# Patient Record
Sex: Female | Born: 1935 | Race: Black or African American | Hispanic: No | Marital: Single | Smoking: Former smoker
Health system: Southern US, Community
[De-identification: ages and names within clinical notes are randomized; demographics above are authoritative.]

## PROBLEM LIST (undated history)

## (undated) DIAGNOSIS — I10 Essential (primary) hypertension: Secondary | ICD-10-CM

## (undated) DIAGNOSIS — E079 Disorder of thyroid, unspecified: Secondary | ICD-10-CM

## (undated) DIAGNOSIS — Z923 Personal history of irradiation: Secondary | ICD-10-CM

## (undated) DIAGNOSIS — Z9221 Personal history of antineoplastic chemotherapy: Secondary | ICD-10-CM

## (undated) DIAGNOSIS — C50919 Malignant neoplasm of unspecified site of unspecified female breast: Secondary | ICD-10-CM

## (undated) DIAGNOSIS — C649 Malignant neoplasm of unspecified kidney, except renal pelvis: Secondary | ICD-10-CM

## (undated) HISTORY — DX: Malignant neoplasm of unspecified site of unspecified female breast: C50.919

## (undated) HISTORY — PX: BREAST BIOPSY: SHX20

## (undated) HISTORY — PX: APPENDECTOMY: SHX54

## (undated) HISTORY — PX: OTHER SURGICAL HISTORY: SHX169

## (undated) HISTORY — DX: Disorder of thyroid, unspecified: E07.9

## (undated) HISTORY — DX: Essential (primary) hypertension: I10

## (undated) HISTORY — PX: BREAST SURGERY: SHX581

## (undated) HISTORY — PX: VAGINAL HYSTERECTOMY: SUR661

---

## 2004-02-05 ENCOUNTER — Ambulatory Visit: Payer: Self-pay | Admitting: Oncology

## 2004-02-17 ENCOUNTER — Ambulatory Visit: Payer: Self-pay | Admitting: Oncology

## 2004-03-25 ENCOUNTER — Ambulatory Visit: Payer: Self-pay | Admitting: Oncology

## 2005-02-10 ENCOUNTER — Ambulatory Visit: Payer: Self-pay | Admitting: Oncology

## 2005-02-16 ENCOUNTER — Ambulatory Visit: Payer: Self-pay | Admitting: Oncology

## 2005-04-07 ENCOUNTER — Ambulatory Visit: Payer: Self-pay | Admitting: Oncology

## 2010-12-14 ENCOUNTER — Ambulatory Visit: Payer: Self-pay | Admitting: Family Medicine

## 2010-12-15 ENCOUNTER — Ambulatory Visit: Payer: Self-pay | Admitting: Unknown Physician Specialty

## 2011-01-06 ENCOUNTER — Ambulatory Visit: Payer: Self-pay | Admitting: Surgery

## 2011-01-07 LAB — CEA: CEA: 3 ng/mL (ref 0.0–4.7)

## 2011-01-17 ENCOUNTER — Ambulatory Visit: Payer: Self-pay | Admitting: Internal Medicine

## 2011-02-04 ENCOUNTER — Ambulatory Visit: Payer: Self-pay | Admitting: Surgery

## 2011-02-04 HISTORY — PX: MASTECTOMY: SHX3

## 2011-03-07 ENCOUNTER — Ambulatory Visit: Payer: Self-pay | Admitting: Oncology

## 2011-03-25 ENCOUNTER — Ambulatory Visit: Payer: Self-pay | Admitting: Oncology

## 2011-04-19 ENCOUNTER — Ambulatory Visit: Payer: Self-pay | Admitting: Oncology

## 2011-05-02 LAB — CBC CANCER CENTER
HCT: 38.4 % (ref 35.0–47.0)
HGB: 12.4 g/dL (ref 12.0–16.0)
Lymphocytes: 20 %
MCHC: 32.4 g/dL (ref 32.0–36.0)
MCV: 80 fL (ref 80–100)
Monocytes: 7 %
RDW: 14.6 % — ABNORMAL HIGH (ref 11.5–14.5)
Segmented Neutrophils: 72 %
WBC: 3.7 x10 3/mm (ref 3.6–11.0)

## 2011-05-09 LAB — CBC CANCER CENTER
Basophil #: 0 x10 3/mm (ref 0.0–0.1)
Eosinophil #: 0 x10 3/mm (ref 0.0–0.7)
HGB: 13.4 g/dL (ref 12.0–16.0)
Lymphocyte #: 1.1 x10 3/mm (ref 1.0–3.6)
Lymphocyte %: 29.4 %
Lymphocytes: 24 %
MCH: 25.9 pg — ABNORMAL LOW (ref 26.0–34.0)
MCHC: 32.9 g/dL (ref 32.0–36.0)
MCV: 79 fL — ABNORMAL LOW (ref 80–100)
Monocyte #: 0.4 x10 3/mm (ref 0.0–0.7)
Monocytes: 8 %
Neutrophil %: 58.5 %
Platelet: 236 x10 3/mm (ref 150–440)
RBC: 5.15 10*6/uL (ref 3.80–5.20)
RDW: 14.8 % — ABNORMAL HIGH (ref 11.5–14.5)
Segmented Neutrophils: 68 %

## 2011-05-16 LAB — CBC CANCER CENTER
Basophil #: 0 x10 3/mm (ref 0.0–0.1)
Basophil %: 0 %
Eosinophil #: 0 x10 3/mm (ref 0.0–0.7)
HGB: 13 g/dL (ref 12.0–16.0)
Lymphocyte %: 16.5 %
MCH: 26.1 pg (ref 26.0–34.0)
MCHC: 32.9 g/dL (ref 32.0–36.0)
Monocyte #: 0.4 x10 3/mm (ref 0.0–0.7)
Monocyte %: 14.7 %
Neutrophil %: 68.8 %
RDW: 15.1 % — ABNORMAL HIGH (ref 11.5–14.5)

## 2011-05-20 ENCOUNTER — Ambulatory Visit: Payer: Self-pay | Admitting: Oncology

## 2011-05-23 LAB — CBC CANCER CENTER
HCT: 40.2 % (ref 35.0–47.0)
HGB: 13.3 g/dL (ref 12.0–16.0)
Lymphocytes: 14 %
MCV: 79 fL — ABNORMAL LOW (ref 80–100)
Monocytes: 14 %
RBC: 5.1 10*6/uL (ref 3.80–5.20)
WBC: 3 x10 3/mm — ABNORMAL LOW (ref 3.6–11.0)

## 2011-06-06 LAB — CBC CANCER CENTER
Bands: 2 %
Basophil %: 0 %
Comment - H1-Com3: NORMAL
Eosinophil %: 0.3 %
HCT: 37.4 % (ref 35.0–47.0)
HGB: 12.4 g/dL (ref 12.0–16.0)
Lymphocyte #: 0.6 x10 3/mm — ABNORMAL LOW (ref 1.0–3.6)
Lymphocytes: 22 %
MCH: 26.2 pg (ref 26.0–34.0)
MCHC: 33.1 g/dL (ref 32.0–36.0)
MCV: 79 fL — ABNORMAL LOW (ref 80–100)
Monocyte #: 0.3 x10 3/mm (ref 0.0–0.7)
Monocytes: 12 %
RDW: 15.9 % — ABNORMAL HIGH (ref 11.5–14.5)
WBC: 2.4 x10 3/mm — ABNORMAL LOW (ref 3.6–11.0)

## 2011-06-17 ENCOUNTER — Ambulatory Visit: Payer: Self-pay | Admitting: Oncology

## 2011-07-14 LAB — CBC CANCER CENTER
HGB: 13.9 g/dL (ref 12.0–16.0)
Lymphocyte #: 0.8 x10 3/mm — ABNORMAL LOW (ref 1.0–3.6)
Lymphocyte %: 28.7 %
Lymphocytes: 27 %
MCH: 26.5 pg (ref 26.0–34.0)
MCHC: 33.4 g/dL (ref 32.0–36.0)
MCV: 79 fL — ABNORMAL LOW (ref 80–100)
Monocyte %: 15.5 %
Monocytes: 12 %
Neutrophil #: 1.6 x10 3/mm (ref 1.4–6.5)
RBC: 5.24 10*6/uL — ABNORMAL HIGH (ref 3.80–5.20)
Segmented Neutrophils: 61 %
WBC: 2.9 x10 3/mm — ABNORMAL LOW (ref 3.6–11.0)

## 2011-07-14 LAB — COMPREHENSIVE METABOLIC PANEL
Albumin: 3.6 g/dL (ref 3.4–5.0)
Alkaline Phosphatase: 75 U/L (ref 50–136)
Anion Gap: 8 (ref 7–16)
BUN: 20 mg/dL — ABNORMAL HIGH (ref 7–18)
Chloride: 106 mmol/L (ref 98–107)
EGFR (African American): >60
EGFR (Non-African Amer.): 53 — ABNORMAL LOW
Glucose: 123 mg/dL — ABNORMAL HIGH (ref 65–99)
Osmolality: 287 (ref 275–301)
Potassium: 3.2 mmol/L — ABNORMAL LOW (ref 3.5–5.1)
SGOT(AST): 21 U/L (ref 15–37)
Sodium: 142 mmol/L (ref 136–145)

## 2011-07-18 ENCOUNTER — Ambulatory Visit: Payer: Self-pay | Admitting: Oncology

## 2011-07-18 LAB — CANCER ANTIGEN 27.29: CA 27.29: 32.7 U/mL (ref 0.0–38.6)

## 2012-01-16 ENCOUNTER — Ambulatory Visit: Payer: Self-pay | Admitting: Oncology

## 2012-01-16 LAB — COMPREHENSIVE METABOLIC PANEL
Alkaline Phosphatase: 78 U/L (ref 50–136)
Anion Gap: 12 (ref 7–16)
BUN: 10 mg/dL (ref 7–18)
Bilirubin,Total: 0.3 mg/dL (ref 0.2–1.0)
Calcium, Total: 10.4 mg/dL — ABNORMAL HIGH (ref 8.5–10.1)
Co2: 27 mmol/L (ref 21–32)
EGFR (African American): >60
EGFR (Non-African Amer.): >60
Glucose: 100 mg/dL — ABNORMAL HIGH (ref 65–99)
Potassium: 3.4 mmol/L — ABNORMAL LOW (ref 3.5–5.1)
SGOT(AST): 23 U/L (ref 15–37)
SGPT (ALT): 24 U/L (ref 12–78)
Sodium: 142 mmol/L (ref 136–145)
Total Protein: 8 g/dL (ref 6.4–8.2)

## 2012-01-16 LAB — CBC CANCER CENTER
Basophil %: 0.2 %
Eosinophil %: 0.5 %
HCT: 44.5 % (ref 35.0–47.0)
Lymphocyte #: 0.8 x10 3/mm — ABNORMAL LOW (ref 1.0–3.6)
Monocyte %: 10 %
Neutrophil %: 68.3 %
Platelet: 157 x10 3/mm (ref 150–440)
WBC: 4 x10 3/mm (ref 3.6–11.0)

## 2012-01-17 ENCOUNTER — Ambulatory Visit: Payer: Self-pay | Admitting: Oncology

## 2012-01-17 LAB — CANCER ANTIGEN 27.29: CA 27.29: 22.4 U/mL (ref 0.0–38.6)

## 2012-02-21 ENCOUNTER — Inpatient Hospital Stay: Payer: Self-pay | Admitting: Surgery

## 2012-02-21 LAB — URINALYSIS, COMPLETE
Bilirubin,UR: NEGATIVE
Blood: NEGATIVE
Glucose,UR: NEGATIVE mg/dL (ref 0–75)
Nitrite: NEGATIVE
Ph: 6 (ref 4.5–8.0)
Protein: NEGATIVE
Squamous Epithelial: 1
WBC UR: 1 /HPF (ref 0–5)

## 2012-02-21 LAB — COMPREHENSIVE METABOLIC PANEL
Albumin: 3.3 g/dL — ABNORMAL LOW (ref 3.4–5.0)
Alkaline Phosphatase: 71 U/L (ref 50–136)
Anion Gap: 7 (ref 7–16)
BUN: 15 mg/dL (ref 7–18)
Calcium, Total: 10 mg/dL (ref 8.5–10.1)
Co2: 28 mmol/L (ref 21–32)
EGFR (Non-African Amer.): >60
Glucose: 98 mg/dL (ref 65–99)
Osmolality: 278 (ref 275–301)
Potassium: 4.1 mmol/L (ref 3.5–5.1)
SGPT (ALT): 18 U/L (ref 12–78)
Total Protein: 8.1 g/dL (ref 6.4–8.2)

## 2012-02-21 LAB — CBC
HCT: 41 % (ref 35.0–47.0)
MCH: 28.1 pg (ref 26.0–34.0)
MCHC: 34 g/dL (ref 32.0–36.0)
Platelet: 193 10*3/uL (ref 150–440)

## 2012-02-21 LAB — LIPASE, BLOOD: Lipase: 143 U/L (ref 73–393)

## 2012-02-22 LAB — BASIC METABOLIC PANEL
Calcium, Total: 8.9 mg/dL (ref 8.5–10.1)
Chloride: 102 mmol/L (ref 98–107)
Co2: 29 mmol/L (ref 21–32)
Creatinine: 0.85 mg/dL (ref 0.60–1.30)
EGFR (Non-African Amer.): >60
Osmolality: 278 (ref 275–301)

## 2012-02-22 LAB — CBC WITH DIFFERENTIAL/PLATELET
Basophil #: 0 10*3/uL (ref 0.0–0.1)
Eosinophil %: 0.1 %
HGB: 12.4 g/dL (ref 12.0–16.0)
MCHC: 33.7 g/dL (ref 32.0–36.0)
Monocyte #: 0.8 x10 3/mm (ref 0.2–0.9)
Monocyte %: 5.7 %
Neutrophil %: 89.7 %
Platelet: 201 10*3/uL (ref 150–440)
RBC: 4.48 10*6/uL (ref 3.80–5.20)
RDW: 15 % — ABNORMAL HIGH (ref 11.5–14.5)
WBC: 13.3 10*3/uL — ABNORMAL HIGH (ref 3.6–11.0)

## 2012-02-24 LAB — BASIC METABOLIC PANEL
BUN: 2 mg/dL — ABNORMAL LOW (ref 7–18)
Calcium, Total: 8.9 mg/dL (ref 8.5–10.1)
EGFR (Non-African Amer.): >60
Glucose: 124 mg/dL — ABNORMAL HIGH (ref 65–99)
Osmolality: 281 (ref 275–301)
Sodium: 142 mmol/L (ref 136–145)

## 2012-02-24 LAB — CBC WITH DIFFERENTIAL/PLATELET
Basophil #: 0 10*3/uL (ref 0.0–0.1)
Eosinophil %: 0.3 %
Lymphocyte %: 12.5 %
MCH: 27.8 pg (ref 26.0–34.0)
MCV: 81 fL (ref 80–100)
Monocyte %: 7.6 %
Neutrophil %: 79.3 %
Platelet: 227 10*3/uL (ref 150–440)
RBC: 4.47 10*6/uL (ref 3.80–5.20)
RDW: 14.9 % — ABNORMAL HIGH (ref 11.5–14.5)
WBC: 5.2 10*3/uL (ref 3.6–11.0)

## 2012-04-03 ENCOUNTER — Ambulatory Visit: Payer: Self-pay | Admitting: Surgery

## 2012-04-03 LAB — CBC WITH DIFFERENTIAL/PLATELET
Basophil #: 0 10*3/uL (ref 0.0–0.1)
Basophil %: 0.2 %
Eosinophil #: 0 10*3/uL (ref 0.0–0.7)
Eosinophil %: 0 %
HCT: 41 % (ref 35.0–47.0)
HGB: 13.6 g/dL (ref 12.0–16.0)
Lymphocyte #: 0.8 10*3/uL — ABNORMAL LOW (ref 1.0–3.6)
MCHC: 33.1 g/dL (ref 32.0–36.0)
MCV: 84 fL (ref 80–100)
Monocyte #: 0.5 x10 3/mm (ref 0.2–0.9)
Monocyte %: 10.6 %
Neutrophil #: 3.4 10*3/uL (ref 1.4–6.5)
Neutrophil %: 73.1 %
Platelet: 177 10*3/uL (ref 150–440)
RBC: 4.91 10*6/uL (ref 3.80–5.20)
WBC: 4.7 10*3/uL (ref 3.6–11.0)

## 2012-04-03 LAB — BASIC METABOLIC PANEL
BUN: 14 mg/dL (ref 7–18)
Chloride: 103 mmol/L (ref 98–107)
Creatinine: 1.04 mg/dL (ref 0.60–1.30)
Potassium: 2.9 mmol/L — ABNORMAL LOW (ref 3.5–5.1)
Sodium: 143 mmol/L (ref 136–145)

## 2012-04-06 ENCOUNTER — Ambulatory Visit: Payer: Self-pay | Admitting: Surgery

## 2012-05-10 ENCOUNTER — Ambulatory Visit: Payer: Self-pay | Admitting: Urology

## 2012-05-24 ENCOUNTER — Ambulatory Visit: Payer: Self-pay | Admitting: Urology

## 2012-05-25 ENCOUNTER — Ambulatory Visit: Payer: Self-pay | Admitting: Oncology

## 2012-05-25 LAB — CBC CANCER CENTER
Basophil #: 0 x10 3/mm (ref 0.0–0.1)
Basophil %: 0.5 %
Eosinophil %: 0 %
HCT: 39.1 % (ref 35.0–47.0)
Lymphocyte #: 0.7 x10 3/mm — ABNORMAL LOW (ref 1.0–3.6)
Lymphocyte %: 19.5 %
MCHC: 33.4 g/dL (ref 32.0–36.0)
MCV: 83 fL (ref 80–100)
Monocyte #: 0.3 x10 3/mm (ref 0.2–0.9)
Monocyte %: 7.1 %
Neutrophil #: 2.6 x10 3/mm (ref 1.4–6.5)
Neutrophil %: 72.9 %
Platelet: 158 x10 3/mm (ref 150–440)
RBC: 4.71 10*6/uL (ref 3.80–5.20)
WBC: 3.6 x10 3/mm (ref 3.6–11.0)

## 2012-05-25 LAB — COMPREHENSIVE METABOLIC PANEL
Anion Gap: 9 (ref 7–16)
BUN: 18 mg/dL (ref 7–18)
Bilirubin,Total: 0.4 mg/dL (ref 0.2–1.0)
Calcium, Total: 9.8 mg/dL (ref 8.5–10.1)
Co2: 28 mmol/L (ref 21–32)
Creatinine: 1.01 mg/dL (ref 0.60–1.30)
Osmolality: 287 (ref 275–301)
Potassium: 3.5 mmol/L (ref 3.5–5.1)
SGOT(AST): 19 U/L (ref 15–37)

## 2012-05-25 LAB — APTT: Activated PTT: 32.1 secs (ref 23.6–35.9)

## 2012-05-25 LAB — PROTIME-INR: Prothrombin Time: 13.1 secs (ref 11.5–14.7)

## 2012-06-08 ENCOUNTER — Ambulatory Visit: Payer: Self-pay | Admitting: Internal Medicine

## 2012-06-16 ENCOUNTER — Ambulatory Visit: Payer: Self-pay | Admitting: Oncology

## 2012-07-13 LAB — COMPREHENSIVE METABOLIC PANEL
Albumin: 3.6 g/dL (ref 3.4–5.0)
Alkaline Phosphatase: 81 U/L (ref 50–136)
Anion Gap: 10 (ref 7–16)
BUN: 17 mg/dL (ref 7–18)
Chloride: 105 mmol/L (ref 98–107)
Creatinine: 0.97 mg/dL (ref 0.60–1.30)
EGFR (African American): >60
Osmolality: 284 (ref 275–301)
Potassium: 3.4 mmol/L — ABNORMAL LOW (ref 3.5–5.1)
SGOT(AST): 17 U/L (ref 15–37)
SGPT (ALT): 18 U/L (ref 12–78)
Sodium: 142 mmol/L (ref 136–145)
Total Protein: 7.8 g/dL (ref 6.4–8.2)

## 2012-07-13 LAB — CBC CANCER CENTER
Basophil #: 0 x10 3/mm (ref 0.0–0.1)
Basophil %: 0.7 %
Eosinophil #: 0 x10 3/mm (ref 0.0–0.7)
Eosinophil %: 0 %
Lymphocyte %: 21.1 %
MCH: 25.7 pg — ABNORMAL LOW (ref 26.0–34.0)
MCHC: 31.5 g/dL — ABNORMAL LOW (ref 32.0–36.0)
RDW: 14.3 % (ref 11.5–14.5)
WBC: 5.1 x10 3/mm (ref 3.6–11.0)

## 2012-07-17 ENCOUNTER — Ambulatory Visit: Payer: Self-pay | Admitting: Oncology

## 2012-08-01 ENCOUNTER — Ambulatory Visit: Payer: Self-pay | Admitting: Surgery

## 2012-08-01 LAB — POTASSIUM: Potassium: 3.9 mmol/L (ref 3.5–5.1)

## 2012-08-01 LAB — PATHOLOGY REPORT

## 2012-08-02 ENCOUNTER — Ambulatory Visit: Payer: Self-pay | Admitting: Surgery

## 2012-08-08 LAB — COMPREHENSIVE METABOLIC PANEL
Albumin: 3.3 g/dL — ABNORMAL LOW (ref 3.4–5.0)
Alkaline Phosphatase: 75 U/L (ref 50–136)
Anion Gap: 9 (ref 7–16)
Bilirubin,Total: 0.2 mg/dL (ref 0.2–1.0)
Calcium, Total: 9.6 mg/dL (ref 8.5–10.1)
Chloride: 108 mmol/L — ABNORMAL HIGH (ref 98–107)
Co2: 27 mmol/L (ref 21–32)
Creatinine: 1.07 mg/dL (ref 0.60–1.30)
EGFR (African American): 55 — ABNORMAL LOW
EGFR (Non-African Amer.): 47 — ABNORMAL LOW
Potassium: 3.3 mmol/L — ABNORMAL LOW (ref 3.5–5.1)
SGOT(AST): 40 U/L — ABNORMAL HIGH (ref 15–37)
SGPT (ALT): 50 U/L (ref 12–78)
Sodium: 144 mmol/L (ref 136–145)
Total Protein: 7.3 g/dL (ref 6.4–8.2)

## 2012-08-08 LAB — CBC CANCER CENTER
Basophil #: 0 x10 3/mm (ref 0.0–0.1)
Basophil %: 0.1 %
Eosinophil #: 0 x10 3/mm (ref 0.0–0.7)
Eosinophil %: 0 %
HGB: 12 g/dL (ref 12.0–16.0)
Lymphocyte #: 0.8 x10 3/mm — ABNORMAL LOW (ref 1.0–3.6)
MCHC: 32.7 g/dL (ref 32.0–36.0)
MCV: 81 fL (ref 80–100)
Neutrophil #: 3.2 x10 3/mm (ref 1.4–6.5)
Neutrophil %: 75.2 %
Platelet: 149 x10 3/mm — ABNORMAL LOW (ref 150–440)
RDW: 14.6 % — ABNORMAL HIGH (ref 11.5–14.5)
WBC: 4.3 x10 3/mm (ref 3.6–11.0)

## 2012-08-15 LAB — COMPREHENSIVE METABOLIC PANEL
Albumin: 3.3 g/dL — ABNORMAL LOW (ref 3.4–5.0)
Alkaline Phosphatase: 61 U/L (ref 50–136)
Anion Gap: 8 (ref 7–16)
Calcium, Total: 9.6 mg/dL (ref 8.5–10.1)
Chloride: 106 mmol/L (ref 98–107)
Creatinine: 1 mg/dL (ref 0.60–1.30)
SGOT(AST): 12 U/L — ABNORMAL LOW (ref 15–37)
SGPT (ALT): 20 U/L (ref 12–78)
Total Protein: 7.2 g/dL (ref 6.4–8.2)

## 2012-08-15 LAB — CBC CANCER CENTER
Basophil #: 0 x10 3/mm (ref 0.0–0.1)
Basophil %: 0.1 %
HCT: 36.9 % (ref 35.0–47.0)
Lymphocyte #: 0.9 x10 3/mm — ABNORMAL LOW (ref 1.0–3.6)
MCHC: 32.3 g/dL (ref 32.0–36.0)
Neutrophil %: 66.4 %
Platelet: 169 x10 3/mm (ref 150–440)
RDW: 14.8 % — ABNORMAL HIGH (ref 11.5–14.5)

## 2012-08-16 ENCOUNTER — Ambulatory Visit: Payer: Self-pay | Admitting: Oncology

## 2012-08-22 LAB — CBC CANCER CENTER
Basophil #: 0 x10 3/mm (ref 0.0–0.1)
Basophil %: 0.1 %
Eosinophil #: 0 x10 3/mm (ref 0.0–0.7)
Eosinophil %: 0 %
Lymphocyte %: 24.8 %
MCV: 81 fL (ref 80–100)
Monocyte #: 0.3 x10 3/mm (ref 0.2–0.9)
Neutrophil #: 2.1 x10 3/mm (ref 1.4–6.5)
Neutrophil %: 66.6 %
Platelet: 151 x10 3/mm (ref 150–440)
RBC: 4.3 10*6/uL (ref 3.80–5.20)
RDW: 15.1 % — ABNORMAL HIGH (ref 11.5–14.5)

## 2012-09-12 LAB — CBC CANCER CENTER
Basophil #: 0 x10 3/mm (ref 0.0–0.1)
Basophil %: 0.4 %
Eosinophil %: 0 %
HCT: 35.9 % (ref 35.0–47.0)
HGB: 11.8 g/dL — ABNORMAL LOW (ref 12.0–16.0)
Lymphocyte #: 0.4 x10 3/mm — ABNORMAL LOW (ref 1.0–3.6)
Lymphocyte %: 11.6 %
MCH: 26.5 pg (ref 26.0–34.0)
MCHC: 32.9 g/dL (ref 32.0–36.0)
Monocyte #: 0.4 x10 3/mm (ref 0.2–0.9)
Neutrophil #: 2.5 x10 3/mm (ref 1.4–6.5)
Neutrophil %: 74.7 %
Platelet: 151 x10 3/mm (ref 150–440)
RBC: 4.46 10*6/uL (ref 3.80–5.20)
RDW: 16.2 % — ABNORMAL HIGH (ref 11.5–14.5)

## 2012-09-12 LAB — COMPREHENSIVE METABOLIC PANEL
Albumin: 3.3 g/dL — ABNORMAL LOW (ref 3.4–5.0)
Alkaline Phosphatase: 66 U/L (ref 50–136)
Anion Gap: 7 (ref 7–16)
BUN: 19 mg/dL — ABNORMAL HIGH (ref 7–18)
Bilirubin,Total: 0.2 mg/dL (ref 0.2–1.0)
Calcium, Total: 9.4 mg/dL (ref 8.5–10.1)
Chloride: 105 mmol/L (ref 98–107)
Co2: 30 mmol/L (ref 21–32)
EGFR (African American): >60
EGFR (Non-African Amer.): 55 — ABNORMAL LOW
Osmolality: 287 (ref 275–301)
SGPT (ALT): 17 U/L (ref 12–78)
Sodium: 142 mmol/L (ref 136–145)

## 2012-09-16 ENCOUNTER — Ambulatory Visit: Payer: Self-pay | Admitting: Oncology

## 2012-09-17 LAB — COMPREHENSIVE METABOLIC PANEL
Alkaline Phosphatase: 74 U/L (ref 50–136)
BUN: 15 mg/dL (ref 7–18)
Bilirubin,Total: 0.2 mg/dL (ref 0.2–1.0)
Chloride: 105 mmol/L (ref 98–107)
Co2: 29 mmol/L (ref 21–32)
EGFR (Non-African Amer.): 52 — ABNORMAL LOW
Glucose: 93 mg/dL (ref 65–99)
Osmolality: 280 (ref 275–301)
Potassium: 3.6 mmol/L (ref 3.5–5.1)
SGOT(AST): 15 U/L (ref 15–37)
SGPT (ALT): 14 U/L (ref 12–78)

## 2012-09-17 LAB — CBC CANCER CENTER
Basophil #: 0 x10 3/mm (ref 0.0–0.1)
Basophil %: 0.2 %
Eosinophil #: 0 x10 3/mm (ref 0.0–0.7)
HCT: 37 % (ref 35.0–47.0)
Lymphocyte %: 10.1 %
MCH: 26.9 pg (ref 26.0–34.0)
MCHC: 33.5 g/dL (ref 32.0–36.0)
Neutrophil %: 77.7 %
RBC: 4.59 10*6/uL (ref 3.80–5.20)

## 2012-09-24 LAB — BASIC METABOLIC PANEL
Anion Gap: 3 — ABNORMAL LOW (ref 7–16)
Calcium, Total: 9.3 mg/dL (ref 8.5–10.1)
Co2: 31 mmol/L (ref 21–32)
EGFR (African American): 54 — ABNORMAL LOW
EGFR (Non-African Amer.): 47 — ABNORMAL LOW
Glucose: 94 mg/dL (ref 65–99)
Osmolality: 278 (ref 275–301)
Sodium: 139 mmol/L (ref 136–145)

## 2012-09-24 LAB — CBC CANCER CENTER
Basophil %: 0.2 %
Eosinophil #: 0 x10 3/mm (ref 0.0–0.7)
HCT: 36 % (ref 35.0–47.0)
HGB: 12.1 g/dL (ref 12.0–16.0)
Lymphocyte %: 8.7 %
MCHC: 33.7 g/dL (ref 32.0–36.0)
MCV: 81 fL (ref 80–100)
Monocyte #: 0.3 x10 3/mm (ref 0.2–0.9)
Neutrophil #: 2.2 x10 3/mm (ref 1.4–6.5)
Platelet: 156 x10 3/mm (ref 150–440)
RDW: 16.7 % — ABNORMAL HIGH (ref 11.5–14.5)
WBC: 2.7 x10 3/mm — ABNORMAL LOW (ref 3.6–11.0)

## 2012-10-01 LAB — COMPREHENSIVE METABOLIC PANEL
Albumin: 3.2 g/dL — ABNORMAL LOW (ref 3.4–5.0)
Alkaline Phosphatase: 73 U/L (ref 50–136)
BUN: 17 mg/dL (ref 7–18)
Calcium, Total: 9.3 mg/dL (ref 8.5–10.1)
Chloride: 105 mmol/L (ref 98–107)
Co2: 29 mmol/L (ref 21–32)
Creatinine: 1.13 mg/dL (ref 0.60–1.30)
EGFR (African American): 51 — ABNORMAL LOW
EGFR (Non-African Amer.): 44 — ABNORMAL LOW
Glucose: 103 mg/dL — ABNORMAL HIGH (ref 65–99)
Osmolality: 277 (ref 275–301)
Potassium: 3.4 mmol/L — ABNORMAL LOW (ref 3.5–5.1)
SGOT(AST): 14 U/L — ABNORMAL LOW (ref 15–37)
SGPT (ALT): 19 U/L (ref 12–78)
Total Protein: 6.5 g/dL (ref 6.4–8.2)

## 2012-10-01 LAB — CBC CANCER CENTER
Basophil %: 0.6 %
Eosinophil #: 0 x10 3/mm (ref 0.0–0.7)
Eosinophil %: 0 %
HCT: 35.4 % (ref 35.0–47.0)
HGB: 11.9 g/dL — ABNORMAL LOW (ref 12.0–16.0)
Lymphocyte %: 7.5 %
MCHC: 33.7 g/dL (ref 32.0–36.0)
MCV: 82 fL (ref 80–100)
Monocyte #: 0.3 x10 3/mm (ref 0.2–0.9)
Monocyte %: 10.5 %
Neutrophil %: 81.4 %
RDW: 17.2 % — ABNORMAL HIGH (ref 11.5–14.5)

## 2012-10-08 LAB — CBC CANCER CENTER
Basophil #: 0 x10 3/mm (ref 0.0–0.1)
Basophil %: 0.4 %
Eosinophil %: 0 %
HCT: 35.1 % (ref 35.0–47.0)
HGB: 11.9 g/dL — ABNORMAL LOW (ref 12.0–16.0)
Lymphocyte #: 0.2 x10 3/mm — ABNORMAL LOW (ref 1.0–3.6)
MCHC: 33.9 g/dL (ref 32.0–36.0)
Monocyte #: 0.3 x10 3/mm (ref 0.2–0.9)
Monocyte %: 14.6 %
Platelet: 155 x10 3/mm (ref 150–440)
RBC: 4.31 10*6/uL (ref 3.80–5.20)
RDW: 18 % — ABNORMAL HIGH (ref 11.5–14.5)
WBC: 2.2 x10 3/mm — ABNORMAL LOW (ref 3.6–11.0)

## 2012-10-08 LAB — BASIC METABOLIC PANEL
Calcium, Total: 9.2 mg/dL (ref 8.5–10.1)
Chloride: 107 mmol/L (ref 98–107)
Creatinine: 0.95 mg/dL (ref 0.60–1.30)
EGFR (African American): >60
Glucose: 109 mg/dL — ABNORMAL HIGH (ref 65–99)
Osmolality: 282 (ref 275–301)
Sodium: 141 mmol/L (ref 136–145)

## 2012-10-16 ENCOUNTER — Ambulatory Visit: Payer: Self-pay | Admitting: Oncology

## 2012-10-29 LAB — CBC CANCER CENTER
Basophil #: 0 x10 3/mm (ref 0.0–0.1)
Basophil %: 0.7 %
Eosinophil %: 0.1 %
HGB: 11.6 g/dL — ABNORMAL LOW (ref 12.0–16.0)
MCH: 27.8 pg (ref 26.0–34.0)
MCHC: 34.3 g/dL (ref 32.0–36.0)
Monocyte #: 0.4 x10 3/mm (ref 0.2–0.9)
Neutrophil #: 1.8 x10 3/mm (ref 1.4–6.5)
Neutrophil %: 69.4 %

## 2012-10-29 LAB — COMPREHENSIVE METABOLIC PANEL
Alkaline Phosphatase: 79 U/L (ref 50–136)
BUN: 12 mg/dL (ref 7–18)
Bilirubin,Total: 0.2 mg/dL (ref 0.2–1.0)
Calcium, Total: 9.4 mg/dL (ref 8.5–10.1)
Chloride: 108 mmol/L — ABNORMAL HIGH (ref 98–107)
Co2: 29 mmol/L (ref 21–32)
Creatinine: 1.05 mg/dL (ref 0.60–1.30)
EGFR (African American): 56 — ABNORMAL LOW
Osmolality: 289 (ref 275–301)
Potassium: 3.4 mmol/L — ABNORMAL LOW (ref 3.5–5.1)
SGPT (ALT): 16 U/L (ref 12–78)
Total Protein: 6.7 g/dL (ref 6.4–8.2)

## 2012-11-16 ENCOUNTER — Ambulatory Visit: Payer: Self-pay | Admitting: Oncology

## 2012-12-17 ENCOUNTER — Ambulatory Visit: Payer: Self-pay | Admitting: Oncology

## 2013-04-29 ENCOUNTER — Ambulatory Visit: Payer: Self-pay | Admitting: Oncology

## 2013-05-06 LAB — CREATININE, SERUM
Creatinine: 1.13 mg/dL (ref 0.60–1.30)
EGFR (African American): 51 — ABNORMAL LOW
EGFR (Non-African Amer.): 44 — ABNORMAL LOW

## 2013-05-14 ENCOUNTER — Ambulatory Visit: Payer: Self-pay | Admitting: Family Medicine

## 2013-05-19 ENCOUNTER — Ambulatory Visit: Payer: Self-pay | Admitting: Oncology

## 2013-06-14 LAB — CBC CANCER CENTER
BASOS PCT: 0.4 %
Basophil #: 0 x10 3/mm (ref 0.0–0.1)
Eosinophil #: 0 x10 3/mm (ref 0.0–0.7)
Eosinophil %: 0.2 %
HCT: 41 % (ref 35.0–47.0)
HGB: 13.4 g/dL (ref 12.0–16.0)
LYMPHS ABS: 0.6 x10 3/mm — AB (ref 1.0–3.6)
LYMPHS PCT: 17.6 %
MCH: 27.4 pg (ref 26.0–34.0)
MCHC: 32.6 g/dL (ref 32.0–36.0)
MCV: 84 fL (ref 80–100)
Monocyte #: 0.3 x10 3/mm (ref 0.2–0.9)
Monocyte %: 9 %
Neutrophil #: 2.5 x10 3/mm (ref 1.4–6.5)
Neutrophil %: 72.8 %
PLATELETS: 151 x10 3/mm (ref 150–440)
RBC: 4.88 10*6/uL (ref 3.80–5.20)
RDW: 17.3 % — AB (ref 11.5–14.5)
WBC: 3.4 x10 3/mm — ABNORMAL LOW (ref 3.6–11.0)

## 2013-06-14 LAB — COMPREHENSIVE METABOLIC PANEL
ALT: 14 U/L (ref 12–78)
AST: 15 U/L (ref 15–37)
Albumin: 3.8 g/dL (ref 3.4–5.0)
Alkaline Phosphatase: 65 U/L
Anion Gap: 9 (ref 7–16)
BUN: 13 mg/dL (ref 7–18)
Bilirubin,Total: 0.2 mg/dL (ref 0.2–1.0)
CREATININE: 0.99 mg/dL (ref 0.60–1.30)
Calcium, Total: 9.6 mg/dL (ref 8.5–10.1)
Chloride: 105 mmol/L (ref 98–107)
Co2: 28 mmol/L (ref 21–32)
EGFR (Non-African Amer.): 52 — ABNORMAL LOW
GFR CALC AF AMER: 60 — AB
GLUCOSE: 99 mg/dL (ref 65–99)
Osmolality: 283 (ref 275–301)
Potassium: 3.3 mmol/L — ABNORMAL LOW (ref 3.5–5.1)
Sodium: 142 mmol/L (ref 136–145)
Total Protein: 7.7 g/dL (ref 6.4–8.2)

## 2013-06-16 ENCOUNTER — Ambulatory Visit: Payer: Self-pay | Admitting: Oncology

## 2013-07-17 ENCOUNTER — Ambulatory Visit: Payer: Self-pay | Admitting: Oncology

## 2013-09-17 ENCOUNTER — Ambulatory Visit: Payer: Self-pay | Admitting: Oncology

## 2013-09-17 LAB — CREATININE, SERUM
Creatinine: 0.85 mg/dL (ref 0.60–1.30)
EGFR (African American): >60

## 2013-10-16 ENCOUNTER — Ambulatory Visit: Payer: Self-pay | Admitting: Oncology

## 2014-01-10 ENCOUNTER — Ambulatory Visit: Payer: Self-pay | Admitting: Oncology

## 2014-01-10 LAB — COMPREHENSIVE METABOLIC PANEL
ANION GAP: 6 — AB (ref 7–16)
Albumin: 3.7 g/dL (ref 3.4–5.0)
Alkaline Phosphatase: 59 U/L
BUN: 12 mg/dL (ref 7–18)
Bilirubin,Total: 0.4 mg/dL (ref 0.2–1.0)
Calcium, Total: 9.9 mg/dL (ref 8.5–10.1)
Chloride: 105 mmol/L (ref 98–107)
Co2: 29 mmol/L (ref 21–32)
Creatinine: 1.02 mg/dL (ref 0.60–1.30)
EGFR (African American): >60
EGFR (Non-African Amer.): 54 — ABNORMAL LOW
GLUCOSE: 105 mg/dL — AB (ref 65–99)
OSMOLALITY: 280 (ref 275–301)
Potassium: 3.4 mmol/L — ABNORMAL LOW (ref 3.5–5.1)
SGOT(AST): 15 U/L (ref 15–37)
SGPT (ALT): 19 U/L
Sodium: 140 mmol/L (ref 136–145)
TOTAL PROTEIN: 7.2 g/dL (ref 6.4–8.2)

## 2014-01-10 LAB — CBC CANCER CENTER
BASOS ABS: 0 x10 3/mm (ref 0.0–0.1)
Basophil %: 0.4 %
EOS ABS: 0 x10 3/mm (ref 0.0–0.7)
Eosinophil %: 0 %
HCT: 42.1 % (ref 35.0–47.0)
HGB: 13.8 g/dL (ref 12.0–16.0)
Lymphocyte #: 1 x10 3/mm (ref 1.0–3.6)
Lymphocyte %: 24.7 %
MCH: 26.7 pg (ref 26.0–34.0)
MCHC: 32.8 g/dL (ref 32.0–36.0)
MCV: 82 fL (ref 80–100)
MONOS PCT: 11.7 %
Monocyte #: 0.5 x10 3/mm (ref 0.2–0.9)
NEUTROS PCT: 63.2 %
Neutrophil #: 2.6 x10 3/mm (ref 1.4–6.5)
PLATELETS: 184 x10 3/mm (ref 150–440)
RBC: 5.16 10*6/uL (ref 3.80–5.20)
RDW: 16.8 % — ABNORMAL HIGH (ref 11.5–14.5)
WBC: 4.1 x10 3/mm (ref 3.6–11.0)

## 2014-01-16 ENCOUNTER — Ambulatory Visit: Payer: Self-pay | Admitting: Oncology

## 2014-02-16 ENCOUNTER — Ambulatory Visit: Payer: Self-pay | Admitting: Oncology

## 2014-03-18 ENCOUNTER — Ambulatory Visit: Payer: Self-pay | Admitting: Oncology

## 2014-04-07 LAB — CBC CANCER CENTER
BASOS PCT: 0.3 %
Basophil #: 0 x10 3/mm (ref 0.0–0.1)
Eosinophil #: 0 x10 3/mm (ref 0.0–0.7)
Eosinophil %: 0 %
HCT: 41.3 % (ref 35.0–47.0)
HGB: 13.6 g/dL (ref 12.0–16.0)
LYMPHS ABS: 1.2 x10 3/mm (ref 1.0–3.6)
Lymphocyte %: 27.8 %
MCH: 27.2 pg (ref 26.0–34.0)
MCHC: 33 g/dL (ref 32.0–36.0)
MCV: 82 fL (ref 80–100)
MONOS PCT: 11.8 %
Monocyte #: 0.5 x10 3/mm (ref 0.2–0.9)
Neutrophil #: 2.7 x10 3/mm (ref 1.4–6.5)
Neutrophil %: 60.1 %
PLATELETS: 192 x10 3/mm (ref 150–440)
RBC: 5.01 10*6/uL (ref 3.80–5.20)
RDW: 16.9 % — ABNORMAL HIGH (ref 11.5–14.5)
WBC: 4.5 x10 3/mm (ref 3.6–11.0)

## 2014-04-07 LAB — COMPREHENSIVE METABOLIC PANEL
ALBUMIN: 3.7 g/dL (ref 3.4–5.0)
AST: 13 U/L — AB (ref 15–37)
Alkaline Phosphatase: 72 U/L
Anion Gap: 10 (ref 7–16)
BILIRUBIN TOTAL: 0.4 mg/dL (ref 0.2–1.0)
BUN: 14 mg/dL (ref 7–18)
CHLORIDE: 105 mmol/L (ref 98–107)
Calcium, Total: 9.6 mg/dL (ref 8.5–10.1)
Co2: 28 mmol/L (ref 21–32)
Creatinine: 0.86 mg/dL (ref 0.60–1.30)
GLUCOSE: 81 mg/dL (ref 65–99)
Osmolality: 284 (ref 275–301)
Potassium: 3.4 mmol/L — ABNORMAL LOW (ref 3.5–5.1)
SGPT (ALT): 15 U/L
Sodium: 143 mmol/L (ref 136–145)
Total Protein: 7.3 g/dL (ref 6.4–8.2)

## 2014-04-08 LAB — CANCER ANTIGEN 27.29: CA 27.29: 29.6 U/mL (ref 0.0–38.6)

## 2014-04-18 ENCOUNTER — Ambulatory Visit: Payer: Self-pay | Admitting: Oncology

## 2014-05-16 DIAGNOSIS — Z452 Encounter for adjustment and management of vascular access device: Secondary | ICD-10-CM | POA: Diagnosis not present

## 2014-05-16 DIAGNOSIS — C801 Malignant (primary) neoplasm, unspecified: Secondary | ICD-10-CM | POA: Diagnosis not present

## 2014-05-19 ENCOUNTER — Ambulatory Visit: Payer: Self-pay | Admitting: Oncology

## 2014-06-27 ENCOUNTER — Ambulatory Visit
Admit: 2014-06-27 | Disposition: A | Discharge status: Home / Self Care | Payer: Self-pay | Attending: Oncology | Admitting: Oncology

## 2014-06-27 DIAGNOSIS — Z853 Personal history of malignant neoplasm of breast: Secondary | ICD-10-CM | POA: Diagnosis not present

## 2014-06-27 DIAGNOSIS — C3412 Malignant neoplasm of upper lobe, left bronchus or lung: Secondary | ICD-10-CM | POA: Diagnosis not present

## 2014-06-27 DIAGNOSIS — Z87891 Personal history of nicotine dependence: Secondary | ICD-10-CM | POA: Diagnosis not present

## 2014-07-07 DIAGNOSIS — C3412 Malignant neoplasm of upper lobe, left bronchus or lung: Secondary | ICD-10-CM | POA: Diagnosis not present

## 2014-07-07 DIAGNOSIS — R05 Cough: Secondary | ICD-10-CM | POA: Diagnosis not present

## 2014-07-07 DIAGNOSIS — Z853 Personal history of malignant neoplasm of breast: Secondary | ICD-10-CM | POA: Diagnosis not present

## 2014-07-07 DIAGNOSIS — Z87891 Personal history of nicotine dependence: Secondary | ICD-10-CM | POA: Diagnosis not present

## 2014-07-18 ENCOUNTER — Ambulatory Visit
Admit: 2014-07-18 | Disposition: A | Discharge status: Home / Self Care | Payer: Self-pay | Attending: Oncology | Admitting: Oncology

## 2014-07-18 ENCOUNTER — Ambulatory Visit
Admit: 2014-07-18 | Disposition: A | Discharge status: Home / Self Care | Payer: Self-pay | Attending: Hematology and Oncology | Admitting: Hematology and Oncology

## 2014-08-05 NOTE — Discharge Summary (Signed)
PATIENT NAME:  Alexandra Glass, Alexandra Glass MR#:  [id number] DATE OF BIRTH:  Mar 02, 1925  DATE OF ADMISSION:  02/21/2012 DATE OF DISCHARGE:  02/28/2012  FINAL DIAGNOSIS: Acute perforated appendicitis with pelvic abscess.   PRINCIPLE PROCEDURES:  1. Open appendectomy. 2. Drainage of pelvic abscess.   HOSPITAL COURSE SUMMARY: The patient was admitted initially with acute appendicitis, was brought to the operating room on the evening of November 5th. Appendectomy was performed as described above. Postoperatively the patient did well. JP was left in place, never drained any further purulent or bloody fluid. NG tube was minimal on postop day #1 with good urine output. On postoperative day #2 the patient had some incisional tenderness. No bowel or flatus was noted. On postoperative day #3 the patient had no nausea and vomiting, complaining of abdominal pain but not using the PCA. Her diet was advanced. Her Jackson-Pratt was removed and her PCA was discontinued. On postoperative day #4 the patient continued to improve. On postoperative day #5 she was started on some lactulose and Entereg. She was passing gas. On postoperative day #6 the patient continued to do well, continued intravenous antibiotics and on postoperative day #7 the patient was discharged home in stable condition. Follow-up in one week in the office.   DISCHARGE MEDICATIONS:  1. Vicodin 1 to 2 tabs as needed by mouth every six hours for pain.  2. Calcium. 3. Colace. 4. Hydrochlorothiazide. 5. Letrozole. 6. Synthroid. 7. Lumigan eyedrops.   DISCHARGE INSTRUCTIONS: Call with any questions or concerns.   ____________________________ Jeannette How Marina Gravel, MD mab:drc D: 03/14/2012 09:30:53 ET T: 03/14/2012 09:58:38 ET JOB#: [id number]  cc: Elta Guadeloupe A. Marina Gravel, MD, <Dictator> Juline Patch, MD Chelci [place] Bettina Gavia MD ELECTRONICALLY SIGNED 03/14/2012 17:22

## 2014-08-05 NOTE — Op Note (Signed)
PATIENT NAME:  Alexandra Glass, HAIRE MR#:  [id number] DATE OF BIRTH:  17-Apr-1925  DATE OF PROCEDURE:  02/21/2012  PREOPERATIVE DIAGNOSIS: Acute appendicitis.   POSTOPERATIVE DIAGNOSIS: Acute appendicitis with contained perforation.   PROCEDURES PERFORMED:  1. Diagnostic laparoscopy. 2. Attempted laparoscopic appendectomy. 3. Open appendectomy through laparotomy. 4. Drainage of pelvic abscess.   SURGEON: Axel Frisk A. Marina Gravel, MD   ASSISTANT: Surgical scrub technologist   TYPE OF ANESTHESIA: General oral endotracheal.   FINDINGS: There was a significant inflammatory response within the right lower quadrant. The appendix looked to have been perforated for some time with a contained perforation in the mesentery to the terminal ileum. There was a dense inflammatory response within the right lower quadrant.   SPECIMENS: Portion of appendix.   ESTIMATED BLOOD LOSS: 50 mL.   DRAINS:  1. Jackson-Pratt in the pelvis.  2. Nasogastric tube. 3. Foley.   DESCRIPTION OF PROCEDURE: With the patient in the supine position, general endotracheal anesthesia was induced. Her abdomen was widely prepped and draped with ChloraPrep solution. Time-out was observed.   A 12 mm blunt Hassan trocar was placed through an infraumbilical transversely oriented skin incision with stay sutures being passed through the fascia and pneumoperitoneum being established. A 5 mm Bladeless trocar was placed in the right upper quadrant. A 5 mm Bladeless trocar was placed in the suprapubic midline. There was no evidence of free perforation or purulence within the abdomen. The liver appeared to be unremarkable. There was an inflammatory mass within the right lower quadrant and 15 to 20 minute attempt at dissection of this laparoscopically was unsuccessful. The mass was densely adherent to the right pelvic sidewall.   As such, a laparotomy was then performed by removing the trocars under direct visualization. The previous midline scar was opened  sharply with scalpel and carried through musculofascial layers with electrocautery the length of the incision. Self-retaining retractor was placed. Small bowel was run in its entirety and found to be involved in an inflammatory response in the right lower quadrant. Dissection along the pelvic sidewall demonstrated a perforated appendix. Portion of the appendix appeared to be auto lysed with the tip being recovered from the mesenteric surface of the ileum. Small bowel here appeared to be inflamed but without evidence of perforation. I could not identify the appendiceal orifice. The cecum was completely mobilized out of the right lower quadrant. The course of the right ureter was identified and preserved. Indigo carmine was administered by anesthesia intravenously. I placed laparotomy tape within the right lower quadrant for some time during the operation and found no evidence of blue staining.   Several points along the mesentery to the terminal ileum required superficial figure-of-eight #3-0 silk sutures for control along the peritoneal surface. A small rent in the mesentery was observed and then later repaired with interrupted figure-of-eight 3-0 silk sutures. With the right lower quadrant copiously irrigated and hemostasis being obtained, no evidence of ureteral injury or urine leak, a large piece of Seprafilm was placed over the raw surface of the mesentery to the terminal ileum as well as in the pelvis. A 19 mm Blake drain was directed into this site and exited the right upper quadrant port site. Lap and needle count correct x2. Nasogastric tube was placed by anesthesia and confirmed within the antrum of the stomach by palpation. Remaining small and large intestine appeared to be unremarkable. The omentum appeared to be surgically absent. With the abdomen irrigated, lap and needle count correct x2, the fascia was closed with  a running #1 Vicryl from the extremes of the wound in a simple fashion. Subcutaneous  tissues were irrigated. Skin edges were reapproximated utilizing a skin stapler.      The patient was then subsequently extubated and taken to the recovery room in stable and satisfactory condition with Foley catheter and nasogastric tube in place.   ____________________________ Jeannette How. Marina Gravel, MD mab:drc D: 02/21/2012 23:30:09 ET T: 02/22/2012 09:30:55 ET JOB#: [id number]  cc: Elta Guadeloupe A. Marina Gravel, MD, <Dictator> Esta Carmon A Geniya Fulgham MD ELECTRONICALLY SIGNED 02/27/2012 6:12

## 2014-08-05 NOTE — H&P (Signed)
Subjective/Chief Complaint Right lower quadrant pain x 10 hours.  Diarrhea x 7 days.    History of Present Illness Ms. Lepp is a pleasant 81 yo F with a history of breast ca s/p MRM in 2012, hysterectomy and abdominal tumor removal who presents with approx 10 hours of RLQ pain.  She says that her pain began acutely at approx 3 am this morning.  She says that it was the worst pain she has had before.  She had to crawl to the phone to call her family, as she lives alone.  It improved a few hours later and she was able to have a few bites of toast and some water.  No appetite.  No fevers and chills.  + some brash reflux.  Has had diarrhea x 1 week and has been slightly dizzy.  No weight change, chest pain, shortness of breath, cough, current nausea/vomiting, constipation, dysuria/hematuria.  Denies any history of CAD or MI.    Past History Breast ca s/p MRM with Dr. Marina Gravel, 2012 Hypothyroidism HTN S/p hysterectomy (> 29 y ago) H/o abdominal tumor removal, left abdomen (76 y ago)    Past Medical Health Hypertension, Cancer   Past Med/Surgical Hx:  Lower back problems:   Anemia:   Low Back Pain:   Hypothyroidism:   Right breast cancer:   Hypertension:   Mastectomy: right  hysterectomy:   Left Breast Biopsy [Benign]:   Abdominal Surgery [Tumor Benign]:   ALLERGIES:  No Known Allergies:   HOME MEDICATIONS: Medication Instructions Status  Calcium 600+D 600 mg-200 units tablet 1 tab(s) orally 2 times a day x 30 days Active  letrozole 2.5 mg oral tablet 1 tab(s) orally once a day Active  docusate sodium 100 mg oral tablet 1-3 tab(s) orally once a day Active  Lumigan 0.03% ophthalmic solution 1 drop(s) to each affected eye once a day (at bedtime) Active  hydrochlorothiazide 25 mg oral tablet 1 tab(s) orally once a day (in the morning) Active  levothyroxine 88 mcg (0.088 mg) oral tablet 1 tab(s) orally once a day (in the morning) Active   Family and Social History:   Family History  Hypertension  Cancer    Social History negative tobacco, positive ETOH, negative Illicit drugs, Social EtOH    Place of Living Home  Lives by herself   Review of Systems:   Fever/Chills No    Cough No    Sputum No    Abdominal Pain Yes    Diarrhea Yes    Constipation No    Nausea/Vomiting No    SOB/DOE No    Chest Pain No   Physical Exam:   GEN well developed, no acute distress    HEENT pink conjunctivae, PERRL, hearing intact to voice    NECK supple  trachea midline    RESP normal resp effort  clear BS  no use of accessory muscles    CARD regular rate  no murmur  no thrills  No LE edema  no JVD  no Rub    ABD positive tenderness  no liver/spleen enlargement  no hernia  soft  normal BS    GU clear yellow urine draining  no superpubic tenderness    EXTR negative cyanosis/clubbing, negative edema    SKIN normal to palpation, No rashes    NEURO cranial nerves intact, negative rigidity, negative tremor, follows commands    PSYCH alert, A+O to time, place, person   Lab Results: Hepatic:  05-Nov-13 09:46  Bilirubin, Total 0.4   Alkaline Phosphatase 71   SGPT (ALT) 18   SGOT (AST) 31   Total Protein, Serum 8.1   Albumin, Serum  3.3  Routine Chem:  05-Nov-13 09:46    Glucose, Serum 98   BUN 15   Creatinine (comp) 0.73   Sodium, Serum 139   Potassium, Serum 4.1   Chloride, Serum 104   CO2, Serum 28   Calcium (Total), Serum 10.0   Osmolality (calc) 278   eGFR (African American) >60   eGFR (Non-African American) >60 (eGFR values <40m/min/1.73 m2 may be an indication of chronic kidney disease (CKD). Calculated eGFR is useful in patients with stable renal function. The eGFR calculation will not be reliable in acutely ill patients when serum creatinine is changing rapidly. It is not useful in  patients on dialysis. The eGFR calculation may not be applicable to patients at the low and high extremes of body sizes, pregnant women, and vegetarians.)    Result Comment POTASSIUM/AST - Slight hemolysis, interpret results with  - caution.  Result(s) reported on 21 Feb 2012 at 10:14AM.   Anion Gap 7   Lipase 143 (Result(s) reported on 21 Feb 2012 at 10:14AM.)  Routine UA:  05-Nov-13 09:46    Color (UA) Yellow   Clarity (UA) Clear   Glucose (UA) Negative   Bilirubin (UA) Negative   Ketones (UA) Negative   Specific Gravity (UA) 1.009   Blood (UA) Negative   pH (UA) 6.0   Protein (UA) Negative   Nitrite (UA) Negative   Leukocyte Esterase (UA) 1+ (Result(s) reported on 21 Feb 2012 at 11:00AM.)   RBC (UA) <1 /HPF   WBC (UA) 1 /HPF   Bacteria (UA) NONE SEEN   Epithelial Cells (UA) 1 /HPF   Mucous (UA) PRESENT (Result(s) reported on 21 Feb 2012 at 11:00AM.)  Routine Hem:  05-Nov-13 09:46    WBC (CBC) 9.0   RBC (CBC) 4.96   Hemoglobin (CBC) 13.9   Hematocrit (CBC) 41.0   Platelet Count (CBC) 193 (Result(s) reported on 21 Feb 2012 at 10:06AM.)   MCV 83   MCH 28.1   MCHC 34.0   RDW  15.1     Assessment/Admission Diagnosis Ms. EAntoneis a pleasant 81yo F with acute onset RLQ pain.  Currently no fevers, tachycardia.  WBC 9.  CT concerning for acute appendicitis with perforation.  Tender on exam.    Plan I have spoken with Ms. Rackham about appendectomy, which I recommend.  She is discussing it with her family.  Ate at 6:30 so would have to wait until 2:30 anyway.   Electronic Signatures: LFloyde Parkins(MD)  (Signed [id number])  Authored: CHIEF COMPLAINT and HISTORY, PAST MEDICAL/SURGIAL HISTORY, ALLERGIES, HOME MEDICATIONS, FAMILY AND SOCIAL HISTORY, REVIEW OF SYSTEMS, PHYSICAL EXAM, LABS, ASSESSMENT AND PLAN   Last Updated: 05-Nov-13 13:42 by LFloyde Parkins(MD)

## 2014-08-06 DIAGNOSIS — I10 Essential (primary) hypertension: Secondary | ICD-10-CM | POA: Diagnosis not present

## 2014-08-06 DIAGNOSIS — M549 Dorsalgia, unspecified: Secondary | ICD-10-CM | POA: Diagnosis not present

## 2014-08-06 DIAGNOSIS — C50911 Malignant neoplasm of unspecified site of right female breast: Secondary | ICD-10-CM | POA: Diagnosis not present

## 2014-08-06 DIAGNOSIS — Z79811 Long term (current) use of aromatase inhibitors: Secondary | ICD-10-CM | POA: Diagnosis not present

## 2014-08-06 DIAGNOSIS — Z170 Estrogen receptor positive status [ER+]: Secondary | ICD-10-CM | POA: Diagnosis not present

## 2014-08-06 DIAGNOSIS — R05 Cough: Secondary | ICD-10-CM | POA: Diagnosis not present

## 2014-08-06 DIAGNOSIS — Z90710 Acquired absence of both cervix and uterus: Secondary | ICD-10-CM | POA: Diagnosis not present

## 2014-08-06 DIAGNOSIS — Z9011 Acquired absence of right breast and nipple: Secondary | ICD-10-CM | POA: Diagnosis not present

## 2014-08-06 DIAGNOSIS — Z923 Personal history of irradiation: Secondary | ICD-10-CM | POA: Diagnosis not present

## 2014-08-06 DIAGNOSIS — Z800 Family history of malignant neoplasm of digestive organs: Secondary | ICD-10-CM | POA: Diagnosis not present

## 2014-08-06 DIAGNOSIS — Z87891 Personal history of nicotine dependence: Secondary | ICD-10-CM | POA: Diagnosis not present

## 2014-08-06 DIAGNOSIS — I2510 Atherosclerotic heart disease of native coronary artery without angina pectoris: Secondary | ICD-10-CM | POA: Diagnosis not present

## 2014-08-06 DIAGNOSIS — Z9221 Personal history of antineoplastic chemotherapy: Secondary | ICD-10-CM | POA: Diagnosis not present

## 2014-08-06 DIAGNOSIS — N2889 Other specified disorders of kidney and ureter: Secondary | ICD-10-CM | POA: Diagnosis not present

## 2014-08-06 DIAGNOSIS — E039 Hypothyroidism, unspecified: Secondary | ICD-10-CM | POA: Diagnosis not present

## 2014-08-06 DIAGNOSIS — Z801 Family history of malignant neoplasm of trachea, bronchus and lung: Secondary | ICD-10-CM | POA: Diagnosis not present

## 2014-08-06 DIAGNOSIS — Z803 Family history of malignant neoplasm of breast: Secondary | ICD-10-CM | POA: Diagnosis not present

## 2014-08-06 DIAGNOSIS — D649 Anemia, unspecified: Secondary | ICD-10-CM | POA: Diagnosis not present

## 2014-08-06 DIAGNOSIS — C3412 Malignant neoplasm of upper lobe, left bronchus or lung: Secondary | ICD-10-CM | POA: Diagnosis not present

## 2014-08-06 DIAGNOSIS — Z79899 Other long term (current) drug therapy: Secondary | ICD-10-CM | POA: Diagnosis not present

## 2014-08-06 DIAGNOSIS — N289 Disorder of kidney and ureter, unspecified: Secondary | ICD-10-CM | POA: Diagnosis not present

## 2014-08-06 LAB — COMPREHENSIVE METABOLIC PANEL
ALBUMIN: 3.7 g/dL
Alkaline Phosphatase: 58 U/L
Anion Gap: 5 — ABNORMAL LOW (ref 7–16)
BILIRUBIN TOTAL: 0.3 mg/dL
BUN: 13 mg/dL
CREATININE: 0.75 mg/dL
Calcium, Total: 9.3 mg/dL
Chloride: 105 mmol/L
Co2: 27 mmol/L
Glucose: 122 mg/dL — ABNORMAL HIGH
POTASSIUM: 3.3 mmol/L — AB
SGOT(AST): 15 U/L
SGPT (ALT): 8 U/L — ABNORMAL LOW
SODIUM: 137 mmol/L
Total Protein: 6.6 g/dL

## 2014-08-06 LAB — CBC CANCER CENTER
Basophil #: 0 x10 3/mm (ref 0.0–0.1)
Basophil %: 0.7 %
EOS ABS: 0 x10 3/mm (ref 0.0–0.7)
Eosinophil %: 0 %
HCT: 40.2 % (ref 35.0–47.0)
HGB: 13.1 g/dL (ref 12.0–16.0)
LYMPHS ABS: 1.2 x10 3/mm (ref 1.0–3.6)
Lymphocyte %: 25.1 %
MCH: 26.3 pg (ref 26.0–34.0)
MCHC: 32.6 g/dL (ref 32.0–36.0)
MCV: 81 fL (ref 80–100)
MONO ABS: 0.6 x10 3/mm (ref 0.2–0.9)
Monocyte %: 12.8 %
NEUTROS ABS: 3 x10 3/mm (ref 1.4–6.5)
Neutrophil %: 61.4 %
Platelet: 156 x10 3/mm (ref 150–440)
RBC: 4.99 10*6/uL (ref 3.80–5.20)
RDW: 16.4 % — ABNORMAL HIGH (ref 11.5–14.5)
WBC: 4.8 x10 3/mm (ref 3.6–11.0)

## 2014-08-07 LAB — CANCER ANTIGEN 27.29: CA 27.29: 21.6 U/mL (ref 0.0–38.6)

## 2014-08-08 NOTE — Consult Note (Signed)
Reason for Visit: This 81 year old Female patient presents to the clinic for initial evaluation of  lung cancer  OPNA.   Referred by dr. Oliva Bustard.  Diagnosis:  Chief Complaint/Diagnosis   81 year old female with previous history of stage II breast cancer now with a stage IIIB (T2, N3, M0) non-small cell lung cancer alk and EG RF negative  Pathology Report pathology report reviewed   Imaging Report PET CT scan CT scans reviewed   Referral Report clinical notes reviewed   Planned Treatment Regimen concurrent chemotherapy and radiation therapy and split course fashion   HPI   patient is an 81 year old female well known to our department treated back in 2012 for stage II (T2, N0, M0 invasive mammary carcinoma of the right chest status post him modified radical mastectomy for a 3.6 cm tumor with angiolymphatic invasion and limited lymph node dissection.recent was noted on chest CT scan to have a mass in the right upper lobe and mediastinal adenopathy both PET positive. She underwent endoscopic ultrasound [place] positive for poorly differentiated adenocarcinoma. She also has a renal mass which is being followed by CT scan. She is no significant symptoms except persistent weight loss. She specifically denies cough hemoptysis or chest tightness. She's been evaluated by Dr. Oliva Bustard. Alk  andEGRF both negative. She seen today for radiation oncology opinion.  Past Hx:    CAD:    Lower back problems:    Anemia:    Low Back Pain:    Hypothyroidism:    Right breast cancer:    Hypertension:    Mastectomy: right, 2012   hysterectomy:    Left Breast Biopsy [Benign]:    Abdominal Surgery [Tumor Benign]:   Past, Family and Social History:  Past Medical History positive   Cardiovascular coronary artery disease; hyperlipidemia; hypertension   Endocrine hypothyroidism   Past Surgical History mastectomy andhysterectomy   Family History positive   Family History Comments sister with  breast cancer, one sister with pancreatic cancer one sister with lung cancer   Social History noncontributory   Additional Past Medical and Surgical History ccompanied by son today   Allergies:   No Known Allergies:   Home Meds:  Home Medications: Medication Instructions Status  Calcium 600+D 600 mg-200 units tablet 1 tab(s) orally 2 times a day x 30 days Active  levothyroxine 88 mcg (0.088 mg) oral tablet 1 tab(s) orally once a day (in the morning) Active  hydrochlorothiazide 25 mg oral tablet 1 tab(s) orally once a day (in the morning) Active  letrozole 2.5 mg oral tablet 1 tab(s) orally once a day Active  MiraLax  orally once a day, As Needed - for Constipation Active   Review of Systems:  General negative   Performance Status (ECOG) 0   Skin negative   Breast see HPI   Ophthalmologic negative   ENMT negative   Respiratory and Thorax negative   Cardiovascular negative   Gastrointestinal negative   Genitourinary negative   Musculoskeletal negative   Neurological negative   Psychiatric negative   Hematology/Lymphatics negative   Endocrine negative   Allergic/Immunologic negative   Review of Systems   except for the persistent weight loss according to the nurse's Patient denies any weight loss, fatigue, weakness, fever, chills or night sweats. Patient denies any loss of vision, blurred vision. Patient denies any ringing  of the ears or hearing loss. No irregular heartbeat. Patient denies heart murmur or history of fainting. Patient denies any chest pain or pain radiating to her  upper extremities. Patient denies any shortness of breath, difficulty breathing at night, cough or hemoptysis. Patient denies any swelling in the lower legs. Patient denies any nausea vomiting, vomiting of blood, or coffee ground material in the vomitus. Patient denies any stomach pain. Patient states has had normal bowel movements no significant constipation or diarrhea. Patient denies any  dysuria, hematuria or significant nocturia. Patient denies any problems walking, swelling in the joints or loss of balance. Patient denies any skin changes, loss of hair or loss of weight. Patient denies any excessive worrying or anxiety or significant depression. Patient denies any problems with insomnia. Patient denies excessive thirst, polyuria, polydipsia. Patient denies any swollen glands, patient denies easy bruising or easy bleeding. Patient denies any recent infections, allergies or URI. Patient "s visual fields have not changed significantly in recent time.  Nursing Notes:  Nursing Vital Signs and Chemo Nursing Nursing Notes: *CC Vital Signs Flowsheet:   10-Apr-14 14:43  Temp Temperature 96.7  Pulse Pulse 64  Respirations Respirations 18  SBP SBP 144  DBP DBP 75  Pain Scale (0-10)  0  Current Weight (kg) (kg) 58   Physical Exam:  General/Skin/HEENT:  General normal   Skin normal   Eyes normal   ENMT normal   Head and Neck normal   Additional PE well-developed female thin in NAD. She has vitiligo of the skin. Neck is clear without evidence of cervical or supraclavicular adenopathy lungs are clear to A&P cardiac examination shows regular rate and rhythm. Status post right modified radical mastectomy chest wall is clear. Abdomen is benign with no organomegaly or masses noted.   Breasts/Resp/CV/GI/GU:  Respiratory and Thorax normal   Cardiovascular normal   Gastrointestinal normal   Genitourinary normal   MS/Neuro/Psych/Lymph:  Musculoskeletal normal   Neurological normal   Lymphatics normal   Other Results:  Radiology Results: LabUnknown:    06-Feb-14 15:20, PET/CT Dx Kidney/Other UT  PACS Image     02-Apr-14 11:22, MRI Brain  With/Without Contrast  PACS Image   MRI:  MRI Brain  With/Without Contrast   REASON FOR EXAM:    HA  kidney mass  breast CA  lung CA  COMMENTS:       PROCEDURE: MMR - MMR BRAIN WO/W CONTRAST  - Jul 18 2012 11:22AM     RESULT:  Pre- and postgadolinium MR imaging of the brain is performed. The   patient has no previous similar study for comparison.    Axial diffusion images show no evidence of restricted diffusion. Axial   FLAIR and T2 weighted sequence images are obtained. There is significant   artifact from the patient's dental work. There is some mildly increased   FLAIRand T2 signal within the pons centrally with FLAIR signal in the   periventricular and subcortical white matter regions fairly diffusely   which is nonspecific but most likely secondary to chronic microvascular   ischemic disease. T2 increased signalis also noted in these regions.     There is mild mucosal thickening in the ethmoid sinuses. The mastoid air   cells show no significant focal abnormality. Certainly pontine angle   region appears unremarkable. There is prominence of the ventricles and   sulci consistent with atrophy. There is no evidence of intracranial   hemorrhage, definite mass or mass effect. There is no evidence of   abnormal enhancement following intravenous administration of gadolinium   contrast.    IMPRESSION:   1. Changes of atrophy with chronic microvascular ischemic disease. No  acute intracranial abnormality evident. No evolving infarct, acute or   subacute ischemic event or abnormal fluid collection is demonstrated.   Artifact is present from the patient's dental work.    Dictation Site: 1    Verified By: Sundra Aland, M.D., MD  Nuclear Med:    06-Feb-14 15:20, PET/CT Dx Kidney/Other UT  PET/CT Dx Kidney/Other UT   REASON FOR EXAM:    Renal Cell Carcinoma  COMMENTS:       PROCEDURE: PET - PET/CT DX KIDNEY/OTHER UT  - May 24 2012  3:20PM     RESULT: Indication: Renal cell carcinoma  Radiopharmaceutical: 10.95 mCi F18-FDG, intravenously.    Technique: Imaging wasperformed from the skull base to the mid-thigh   using routine PET/CT acquisition protocol.    Injection site: Left forearm  Time of FDG  injection: 1343 hours  Serum glucose: 73 mg/dL   Time of imaging: 1446 hours  Comparison studies: NONE    Findings:    HEAD AND NECK:     The thyroid gland is diffusely hypermetabolic as can be seen with   thyroiditis. There is no cervical soft tissue mass or lymphadenopathy.     CHEST:    There is a 4.4 x 3.5 cm hypermetabolic right upper lobe pulmonary nodule   with an SUV max of 10.4 and an SUV average of 6.5 . There is a   hypermetabolic right paratracheal lymph node measuring 2.8 cm in short   axis.  The heart size is normal. There is no pericardial effusion.     There no pathologically enlarged mediastinal, hilar, or axillary lymph   nodes.     The osseous structures demonstrate no focal abnormality.     ABDOMEN/PELVIS:    The liver demonstrates no focal abnormality. The gallbladder is   unremarkable. The spleen demonstrates no focal abnormality. There is a   4.3 cm complex left renal mass without significant metabolic activity   within the mass. There is a mildly complex left renal mass posterior to   the larger 4.3 cm mass. The smaller left renal mass measures 2.3 cm.   There is a 14 mm rightanterior interpolar mass. The adrenal glands,     pancreas are normal. The bladder is unremarkable.      There is no pneumoperitoneum, pneumatosis, or portal venous gas. There   is no abdominal or pelvic free fluid. There is no lymphadenopathy. There  is sigmoid colon and rectal wall thickening with hypermetabolic activity   which may be secondary to proctocolitis versus is malignancy. Recommend   further evaluation with colonoscopy.    The abdominal aorta is normal in caliber.    The osseous structures are unremarkable.    IMPRESSION:     1. There is a large right upper lobe pulmonary mass with right     paratracheal lymphadenopathy most concerning for malignancy. This may   represent a primary lung malignancy with nodal metastasis versus   metastatic disease.    2.  There are are 2 separate left renal complex mass is most concerning   for renal cell carcinoma and a solitary small 14 mm right renal mass. The   metabolic activity within these masses is different compared with the   pulmonary masses suggesting a different etiology.    3. There is sigmoid colon and rectal wall thickening with hypermetabolic   activity which may be secondary to proctocolitis versus malignancy.   Recommend further evaluation with colonoscopy.    DictationSite: 1  Verified By: Jennette Banker, M.D., MD   Relevent Results:   Relevant Scans and Labs CT scans, brain MRI, and PET CT scan all reviewed   Assessment and Plan: Impression:   stage IIIB non-small cell lung cancer adenocarcinoma of the right upper lobe in 81 year old female with previous history of breast cancer Plan:   I discussed the case personally with Dr. Oliva Bustard. Would like to go ahead with 6000 cGy to the chest and evaluate for patient tolerance and response. Will need to use IMRT treatment planning and delivery to spare previously irradiated right chest wall with multiple fields to spare as much of the chest wall which was previously radiated as possible. Would plan on delivering IMRT dose to spare the right chest wall as stated above. Risks and benefits of treatment including dysphasia, alteration blood counts, including of normal lung, and dysphasia oral all explained in detail to the patient. Patient seems to have some cognitive decline over the past several years although the patient's son is well aware of the diagnosis and our treatment plan. Patient will also be seeing Dr. Oliva Bustard today for evaluation of systemic concurrent chemotherapy CT simulation was set up for next week.  I would like to take this opportunity to thank you for allowing me to continue to participate in this patient's care.  CC Referral:  cc: Dr. Otilio Miu   Electronic Signatures: Baruch Gouty, Roda Shutters (MD)  (Signed 22-Apr-14  14:27)  Authored: HPI, Diagnosis, Past Hx, PFSH, Allergies, Home Meds, ROS, Nursing Notes, Physical Exam, Other Results, Relevent Results, Encounter Assessment and Plan, CC Referring Physician   Last Updated: 22-Apr-14 14:27 by Armstead Peaks (MD)

## 2014-08-08 NOTE — Op Note (Signed)
PATIENT NAME:  Alexandra Glass, Alexandra Glass MR#:  [id number] DATE OF BIRTH:  Jun 02, 1924  DATE OF PROCEDURE:  08/02/2012  PREOPERATIVE DIAGNOSIS: Lung and bilateral renal cancer.   POSTOPERATIVE DIAGNOSIS:  Lung and bilateral renal cancer.   PROCEDURE PERFORMED:  Left internal jugular vein Port-A-Cath insertion utilizing ultrasound guidance and fluoroscopy.   SURGEON: Sherri Rad, M.D.   ASSISTANT: PA student.  ANESTHESIA: Local with heavy intravenous sedation.   FINDINGS: The catheter flushed and aspirated without difficulty. A postoperative chest x-ray demonstrated no evidence of pneumothorax and no PVCs were noted in the recovery room.   ESTIMATED BLOOD LOSS: Minimal.   DESCRIPTION OF PROCEDURE: With informed consent, supine position,  percutaneous lines being placed; the patient's left neck and upper chest were sterilely prepped and draped with ChloraPrep solution followed by [name]. Perioperative antibiotics were administered. Timeout was observed. Utilizing the handheld ultrasound probe, the left internal jugular vein course was identified for location presence and compressibility.   A total of 10 mL of 0.25% plain Marcaine was infiltrated in the notch of the sternocleidomastoid muscle. On first pass, small bore needle was placed directly into the vein with return of nonpulsatile venous -appearing blood and wire was passed. The needle was removed. A subcutaneous pocket was fashioned on the left anterior chest wall after local anesthesia was infiltrated. Subcutaneous pocket was fashioned just above the clavipectoral fascia. A small skin incision was fashioned over the wire with a #11 blade and widened with blunt technique. Utilizing the tunneling apparatus, the catheter was brought between these two sites. With the patient in Trendelenburg, the peel-away introducer with dilator was placed under fluoroscopic guidance. Wire and dilator were removed. The catheter was then inserted to 50 cm and the peel-away  introducer was removed.   Utilizing real-time fluoroscopy, the catheter was then shortened to the appropriate length. It was amputated near its exit site and assembled to the reservoir. The entire reservoir and system flushed and aspirated easily and 3 mL of 1000 units/mL heparinized saline was instilled.   The reservoir was then inserted into the subcutaneous pocket easily. Fluoroscopy of the entire system demonstrated no evidence of kinks.   A 3-0 PDS was used to secure the port site to the clavipectoral fascia at 2 points. Interrupted deep dermal 3-0 Vicryl sutures were used to re-obliterate the deep space. A 4-0 Vicryl subcuticular was applied to all skin edges, followed by the application of benzoin, Telfa, and Tegaderm. A small amount of bleeding was noted in the left neck and as such, an interrupted 4-0 nylon was placed for hemostatic purposes. The patient was then subsequently extubated and taken to the recovery room in stable and satisfactory condition by anesthesia services, where a portable chest x-ray was obtained as noted above.    ____________________________ Jeannette How. Marina Gravel, MD mab:cc D: 08/02/2012 15:40:16 ET T: 08/02/2012 17:38:57 ET JOB#: [id number]  cc: Elta Guadeloupe A. Marina Gravel, MD, <Dictator> Martie Lee. Oliva Bustard, MD Hortencia Conradi MD ELECTRONICALLY SIGNED 08/10/2012 19:35

## 2014-10-03 ENCOUNTER — Other Ambulatory Visit: Payer: Self-pay | Admitting: Oncology

## 2014-10-03 ENCOUNTER — Other Ambulatory Visit: Payer: Self-pay | Admitting: Family Medicine

## 2014-10-03 DIAGNOSIS — E039 Hypothyroidism, unspecified: Secondary | ICD-10-CM

## 2014-10-03 DIAGNOSIS — I10 Essential (primary) hypertension: Secondary | ICD-10-CM

## 2014-10-07 ENCOUNTER — Other Ambulatory Visit: Payer: Self-pay

## 2014-10-09 ENCOUNTER — Ambulatory Visit (INDEPENDENT_AMBULATORY_CARE_PROVIDER_SITE_OTHER): Payer: Medicare Other | Admitting: Family Medicine

## 2014-10-09 ENCOUNTER — Encounter: Payer: Self-pay | Admitting: Family Medicine

## 2014-10-09 ENCOUNTER — Inpatient Hospital Stay: Payer: Medicare Other | Attending: Hematology and Oncology

## 2014-10-09 VITALS — BP 130/82 | HR 70 | Ht 67.0 in | Wt 132.0 lb

## 2014-10-09 DIAGNOSIS — I10 Essential (primary) hypertension: Secondary | ICD-10-CM

## 2014-10-09 DIAGNOSIS — E039 Hypothyroidism, unspecified: Secondary | ICD-10-CM

## 2014-10-09 DIAGNOSIS — Z452 Encounter for adjustment and management of vascular access device: Secondary | ICD-10-CM | POA: Diagnosis not present

## 2014-10-09 DIAGNOSIS — Z95828 Presence of other vascular implants and grafts: Secondary | ICD-10-CM

## 2014-10-09 DIAGNOSIS — E785 Hyperlipidemia, unspecified: Secondary | ICD-10-CM | POA: Diagnosis not present

## 2014-10-09 DIAGNOSIS — C3412 Malignant neoplasm of upper lobe, left bronchus or lung: Secondary | ICD-10-CM | POA: Insufficient documentation

## 2014-10-09 MED ORDER — LEVOTHYROXINE SODIUM 100 MCG PO TABS: ORAL_TABLET | ORAL | Status: DC

## 2014-10-09 MED ORDER — SODIUM CHLORIDE 0.9 % IJ SOLN
10.0000 mL | INTRAMUSCULAR | Status: DC | PRN
  Administered 2014-10-09: 10 mL via INTRAVENOUS
  Filled 2014-10-09: qty 10

## 2014-10-09 MED ORDER — HYDROCHLOROTHIAZIDE 25 MG PO TABS: ORAL_TABLET | ORAL | Status: DC

## 2014-10-09 MED ORDER — HEPARIN SOD (PORK) LOCK FLUSH 100 UNIT/ML IV SOLN
500.0000 [IU] | Freq: Once | INTRAVENOUS | Status: AC
  Administered 2014-10-09: 500 [IU] via INTRAVENOUS

## 2014-10-09 NOTE — Progress Notes (Signed)
Name: Alexandra Glass   MRN: [id number]    DOB: [birth year]   Date:10/09/2014       Progress Note  Subjective  Chief Complaint  Chief Complaint  Patient presents with  . Hypothyroidism  . Hypertension    Hypertension This is a recurrent problem. The current episode started yesterday. The problem has been resolved since onset. The problem is controlled. Pertinent negatives include no anxiety, blurred vision, chest pain, headaches, malaise/fatigue, neck pain, orthopnea, palpitations, peripheral edema, PND, shortness of breath or sweats. There are no associated agents to hypertension. There are no known risk factors for coronary artery disease. Past treatments include nothing. The current treatment provides no improvement. There are no compliance problems.  Hypertensive end-organ damage includes a thyroid problem. There is no history of angina, kidney disease, CAD/MI, CVA, heart failure, left ventricular hypertrophy, PVD or retinopathy. There is no history of chronic renal disease.  Thyroid Problem Presents for follow-up visit. Patient reports no anxiety, cold intolerance, constipation, depressed mood, diaphoresis, diarrhea, dry skin, fatigue, hair loss, heat intolerance, hoarse voice, leg swelling, menstrual problem, nail problem, palpitations, tremors, visual change, weight gain or weight loss. The symptoms have been stable. Past treatments include nothing. The treatment provided mild relief. There is no history of atrial fibrillation, dementia or heart failure. There are no known risk factors.    No problem-specific assessment & plan notes found for this encounter.   Past Medical History  Diagnosis Date  . Thyroid disease   . Hypertension     Past Surgical History  Procedure Laterality Date  . Tumor surg      removed from L) side  . Appendectomy    . Breast surgery Right   . Vaginal hysterectomy      History reviewed. No pertinent family history.  History   Social History  . Marital  Status: Single    Spouse Name: N/A  . Number of Children: N/A  . Years of Education: N/A   Occupational History  . Not on file.   Social History Main Topics  . Smoking status: Never Smoker   . Smokeless tobacco: Not on file  . Alcohol Use: No  . Drug Use: No  . Sexual Activity: No   Other Topics Concern  . Not on file   Social History Narrative  . No narrative on file    Allergies  Allergen Reactions  . No Known Allergies      Review of Systems  Constitutional: Negative for fever, chills, weight loss, weight gain, malaise/fatigue, diaphoresis and fatigue.  HENT: Negative for ear discharge, ear pain, hoarse voice and sore throat.   Eyes: Negative for blurred vision.  Respiratory: Negative for cough, sputum production, shortness of breath and wheezing.   Cardiovascular: Negative for chest pain, palpitations, orthopnea, leg swelling and PND.  Gastrointestinal: Negative for heartburn, nausea, abdominal pain, diarrhea, constipation, blood in stool and melena.  Genitourinary: Negative for dysuria, urgency, frequency, hematuria and menstrual problem.  Musculoskeletal: Negative for myalgias, back pain, joint pain and neck pain.  Skin: Negative for rash.  Neurological: Negative for dizziness, tingling, tremors, sensory change, focal weakness and headaches.  Endo/Heme/Allergies: Negative for environmental allergies, cold intolerance, heat intolerance and polydipsia. Does not bruise/bleed easily.  Psychiatric/Behavioral: Negative for depression and suicidal ideas. The patient is not nervous/anxious and does not have insomnia.      Objective  Filed Vitals:   10/09/14 1026  BP: 130/82  Pulse: 70  Height: '5\' 7"'$  (1.702 m)  Weight: 132 lb (  59.875 kg)    Physical Exam  Constitutional: She is well-developed, well-nourished, and in no distress. No distress.  HENT:  Head: Normocephalic and atraumatic.  Right Ear: External ear normal.  Left Ear: External ear normal.  Nose: Nose  normal.  Mouth/Throat: Oropharynx is clear and moist.  Eyes: Conjunctivae and EOM are normal. Pupils are equal, round, and reactive to light. Right eye exhibits no discharge. Left eye exhibits no discharge.  Neck: Normal range of motion. Neck supple. No JVD present. No thyromegaly present.  Cardiovascular: Normal rate, regular rhythm, normal heart sounds and intact distal pulses.  Exam reveals no gallop and no friction rub.   No murmur heard. Pulmonary/Chest: Effort normal and breath sounds normal.  Abdominal: Soft. Bowel sounds are normal. She exhibits no mass. There is no tenderness. There is no guarding.  Musculoskeletal: Normal range of motion. She exhibits no edema.  Lymphadenopathy:    She has no cervical adenopathy.  Neurological: She is alert. She has normal reflexes.  Skin: Skin is warm and dry. She is not diaphoretic. No erythema.  Psychiatric: Mood and affect normal.  Nursing note and vitals reviewed.     Assessment & Plan  Problem List Items Addressed This Visit      Other   Hyperlipidemia   Relevant Medications   hydrochlorothiazide (HYDRODIURIL) 25 MG tablet    Other Visit Diagnoses    Essential hypertension    -  Primary    Relevant Medications    hydrochlorothiazide (HYDRODIURIL) 25 MG tablet    Hypothyroidism, unspecified hypothyroidism type        Relevant Medications    levothyroxine (SYNTHROID, LEVOTHROID) 100 MCG tablet         Dr. [facility]  10/09/2014

## 2014-10-09 NOTE — Patient Instructions (Signed)

## 2014-10-15 DIAGNOSIS — I10 Essential (primary) hypertension: Secondary | ICD-10-CM | POA: Diagnosis not present

## 2014-10-15 DIAGNOSIS — E039 Hypothyroidism, unspecified: Secondary | ICD-10-CM | POA: Diagnosis not present

## 2014-10-15 DIAGNOSIS — E785 Hyperlipidemia, unspecified: Secondary | ICD-10-CM | POA: Diagnosis not present

## 2014-10-16 LAB — LIPID PANEL
CHOLESTEROL TOTAL: 264 mg/dL — AB (ref 100–199)
Chol/HDL Ratio: 5.1 ratio units — ABNORMAL HIGH (ref 0.0–4.4)
HDL: 52 mg/dL (ref >39)
LDL Calculated: 189 mg/dL — ABNORMAL HIGH (ref 0–99)
TRIGLYCERIDES: 114 mg/dL (ref 0–149)
VLDL Cholesterol Cal: 23 mg/dL (ref 5–40)

## 2014-10-16 LAB — RENAL FUNCTION PANEL
Albumin: 4.2 g/dL (ref 3.5–4.7)
BUN / CREAT RATIO: 18 (ref 11–26)
BUN: 15 mg/dL (ref 8–27)
CO2: 25 mmol/L (ref 18–29)
Calcium: 10 mg/dL (ref 8.7–10.3)
Chloride: 98 mmol/L (ref 97–108)
Creatinine, Ser: 0.84 mg/dL (ref 0.57–1.00)
GFR calc Af Amer: 72 mL/min/{1.73_m2} (ref >59)
GFR calc non Af Amer: 62 mL/min/{1.73_m2} (ref >59)
Glucose: 69 mg/dL (ref 65–99)
Phosphorus: 2.8 mg/dL (ref 2.5–4.5)
Potassium: 4.2 mmol/L (ref 3.5–5.2)
SODIUM: 139 mmol/L (ref 134–144)

## 2014-10-16 LAB — TSH: TSH: 0.796 u[IU]/mL (ref 0.450–4.500)

## 2014-10-27 ENCOUNTER — Other Ambulatory Visit: Payer: Self-pay

## 2014-10-27 DIAGNOSIS — F0391 Unspecified dementia with behavioral disturbance: Secondary | ICD-10-CM

## 2014-10-30 ENCOUNTER — Other Ambulatory Visit: Payer: Self-pay

## 2014-10-30 DIAGNOSIS — C50919 Malignant neoplasm of unspecified site of unspecified female breast: Secondary | ICD-10-CM

## 2014-11-05 ENCOUNTER — Ambulatory Visit: Payer: Medicare Other | Admitting: Hematology and Oncology

## 2014-11-05 ENCOUNTER — Other Ambulatory Visit: Payer: Medicare Other

## 2014-11-07 ENCOUNTER — Ambulatory Visit: Payer: Medicare Other | Admitting: Oncology

## 2014-11-07 ENCOUNTER — Other Ambulatory Visit: Payer: Medicare Other

## 2014-11-12 ENCOUNTER — Ambulatory Visit: Payer: Medicare Other

## 2014-11-12 ENCOUNTER — Inpatient Hospital Stay: Payer: Medicare Other | Attending: Hematology and Oncology

## 2014-11-12 ENCOUNTER — Inpatient Hospital Stay (HOSPITAL_BASED_OUTPATIENT_CLINIC_OR_DEPARTMENT_OTHER): Payer: Medicare Other | Admitting: Hematology and Oncology

## 2014-11-12 ENCOUNTER — Encounter: Payer: Self-pay | Admitting: Hematology and Oncology

## 2014-11-12 VITALS — BP 159/81 | HR 68 | Temp 97.5°F | Ht 66.0 in | Wt 133.8 lb

## 2014-11-12 DIAGNOSIS — Z170 Estrogen receptor positive status [ER+]: Secondary | ICD-10-CM | POA: Insufficient documentation

## 2014-11-12 DIAGNOSIS — C3412 Malignant neoplasm of upper lobe, left bronchus or lung: Secondary | ICD-10-CM | POA: Diagnosis not present

## 2014-11-12 DIAGNOSIS — N289 Disorder of kidney and ureter, unspecified: Secondary | ICD-10-CM | POA: Insufficient documentation

## 2014-11-12 DIAGNOSIS — Z452 Encounter for adjustment and management of vascular access device: Secondary | ICD-10-CM | POA: Insufficient documentation

## 2014-11-12 DIAGNOSIS — Z79810 Long term (current) use of selective estrogen receptor modulators (SERMs): Secondary | ICD-10-CM | POA: Diagnosis not present

## 2014-11-12 DIAGNOSIS — M8580 Other specified disorders of bone density and structure, unspecified site: Secondary | ICD-10-CM

## 2014-11-12 DIAGNOSIS — C50919 Malignant neoplasm of unspecified site of unspecified female breast: Secondary | ICD-10-CM

## 2014-11-12 DIAGNOSIS — I10 Essential (primary) hypertension: Secondary | ICD-10-CM | POA: Diagnosis not present

## 2014-11-12 DIAGNOSIS — N2889 Other specified disorders of kidney and ureter: Secondary | ICD-10-CM

## 2014-11-12 DIAGNOSIS — C50911 Malignant neoplasm of unspecified site of right female breast: Secondary | ICD-10-CM | POA: Diagnosis not present

## 2014-11-12 DIAGNOSIS — C3411 Malignant neoplasm of upper lobe, right bronchus or lung: Secondary | ICD-10-CM | POA: Insufficient documentation

## 2014-11-12 LAB — CBC WITH DIFFERENTIAL/PLATELET
Basophils Absolute: 0 10*3/uL (ref 0–0.1)
Basophils Relative: 1 %
Eosinophils Absolute: 0 10*3/uL (ref 0–0.7)
Eosinophils Relative: 0 %
HCT: 43.7 % (ref 35.0–47.0)
Hemoglobin: 14.2 g/dL (ref 12.0–16.0)
Lymphocytes Relative: 28 %
Lymphs Abs: 1.1 10*3/uL (ref 1.0–3.6)
MCH: 26.2 pg (ref 26.0–34.0)
MCHC: 32.4 g/dL (ref 32.0–36.0)
MCV: 80.7 fL (ref 80.0–100.0)
Monocytes Absolute: 0.6 10*3/uL (ref 0.2–0.9)
Monocytes Relative: 14 %
Neutro Abs: 2.3 10*3/uL (ref 1.4–6.5)
Neutrophils Relative %: 57 %
Platelets: 176 10*3/uL (ref 150–440)
RBC: 5.41 MIL/uL — ABNORMAL HIGH (ref 3.80–5.20)
RDW: 16.2 % — ABNORMAL HIGH (ref 11.5–14.5)
WBC: 4.1 10*3/uL (ref 3.6–11.0)

## 2014-11-12 LAB — COMPREHENSIVE METABOLIC PANEL
ALT: 9 U/L — ABNORMAL LOW (ref 14–54)
AST: 15 U/L (ref 15–41)
Albumin: 4 g/dL (ref 3.5–5.0)
Alkaline Phosphatase: 69 U/L (ref 38–126)
Anion gap: 8 (ref 5–15)
BUN: 20 mg/dL (ref 6–20)
CO2: 29 mmol/L (ref 22–32)
Calcium: 9.7 mg/dL (ref 8.9–10.3)
Chloride: 100 mmol/L — ABNORMAL LOW (ref 101–111)
Creatinine, Ser: 0.96 mg/dL (ref 0.44–1.00)
GFR calc Af Amer: 59 mL/min — ABNORMAL LOW (ref >60)
GFR calc non Af Amer: 51 mL/min — ABNORMAL LOW (ref >60)
Glucose, Bld: 100 mg/dL — ABNORMAL HIGH (ref 65–99)
Potassium: 3.8 mmol/L (ref 3.5–5.1)
Sodium: 137 mmol/L (ref 135–145)
Total Bilirubin: 0.4 mg/dL (ref 0.3–1.2)
Total Protein: 7.2 g/dL (ref 6.5–8.1)

## 2014-11-12 MED ORDER — HEPARIN SOD (PORK) LOCK FLUSH 100 UNIT/ML IV SOLN
500.0000 [IU] | Freq: Once | INTRAVENOUS | Status: AC
  Administered 2014-11-12: 500 [IU] via INTRAVENOUS

## 2014-11-12 MED ORDER — SODIUM CHLORIDE 0.9 % IJ SOLN
10.0000 mL | INTRAMUSCULAR | Status: DC | PRN
  Administered 2014-11-12: 10 mL via INTRAVENOUS
  Filled 2014-11-12: qty 10

## 2014-11-12 NOTE — Progress Notes (Signed)
[facility] day:  11/12/2014  Chief Complaint: Alexandra Glass is a 81 y.o. female with a history of stage II breast cancer and stage IIIB lung cancer who is seen for 3 month assessment.  HPI: The patient was last seen in the medical oncology clinic on 08/06/2014.  At that time,  she was seen for initial assessment by me.  Symptomatically, she was doing well on Femara for her breast cancer.  CXR on 07/07/2014 revealed a left upper lobe nodule as well as architectural distortion/nodularity of the medial right upper lobe felt better characterized by CT scan.  Labs included a normal CBC with diff, CMP (except for a potassium of 3.3)  and CA27.29 (21.6).  During the interim, she has done well.  She voices no complaint.  Past Medical History  Diagnosis Date  . Thyroid disease   . Hypertension   . Breast cancer     Past Surgical History  Procedure Laterality Date  . Tumor surg      removed from L) side  . Appendectomy    . Breast surgery Right   . Vaginal hysterectomy    . Mastectomy Right 2014    Family History  Problem Relation Age of Onset  . Cancer Sister     Social History:  reports that she has never smoked. She does not have any smokeless tobacco history on file. She reports that she does not drink alcohol or use illicit drugs.  The patient is alone today.  Allergies:  Allergies  Allergen Reactions  . No Known Allergies     Current Medications: Current Outpatient Prescriptions  Medication Sig Dispense Refill  . hydrochlorothiazide (HYDRODIURIL) 25 MG tablet TAKE ONE (1) TABLET BY MOUTH ONCE DAILY 30 tablet 8  . letrozole (FEMARA) 2.5 MG tablet TAKE ONE (1) TABLET BY MOUTH ONCE DAILY 30 tablet 0  . levothyroxine (SYNTHROID, LEVOTHROID) 100 MCG tablet TAKE ONE (1) TABLET BY MOUTH ONCE DAILY 30 tablet 8   No current facility-administered medications for this visit.    Review of Systems:  GENERAL:  Sometimes feels good.  No  fevers.  Hot all over.  Sweats.  No weight loss. PERFORMANCE STATUS (ECOG):  2 HEENT:  Runny nose.  No visual changes, sore throat, mouth sores or tenderness. Lungs: Cough sometimes.  No shortness of breath.  No hemoptysis. Cardiac:  No chest pain, palpitations, orthopnea, or PND. GI:  No nausea, vomiting, diarrhea, constipation, melena or hematochezia. GU:  No urgency, frequency, dysuria, or hematuria. Musculoskeletal:  No back pain.  No joint pain.  No muscle tenderness. Extremities:  No pain or swelling. Skin:  No rashes or skin changes. Neuro:  Sometimes headache.  No numbness or weakness, balance or coordination issues. Endocrine:  No diabetes, thyroid issues, hot flashes or night sweats. Psych:  No mood changes, depression or anxiety. Pain:  No focal pain. Review of systems:  All other systems reviewed and found to be negative.  Physical Exam: Blood pressure 159/81, pulse 68, temperature 97.5 F (36.4 C), temperature source Tympanic, height 5' 6"  (1.676 m), weight 133 lb 13.1 oz (60.7 kg). GENERAL:  Well developed, well nourished, sitting comfortably in the exam room in no acute distress. MENTAL STATUS:  Alert and oriented to person, place and time. HEAD:  Graying hair pulled back.  Normocephalic, atraumatic, face symmetric, no Cushingoid features. EYES:  Brown eyes.  Pupils equal round and reactive to light and accomodation.  No conjunctivitis or scleral icterus.  ENT:  Oropharynx clear without lesion.  Dentures.  Tongue normal. Mucous membranes moist.  CHEST WALL:  Left sided port-a-cath. RESPIRATORY:  Clear to auscultation without rales, wheezes or rhonchi. CARDIOVASCULAR:  Regular rate and rhythm without murmur, rub or gallop. BREAST:  Right sided mastectomy.  No erythema or nodularity.  Left breast well healed incision at 2:30 position.  No discrete masses, skin changes or nipple discharge. ABDOMEN:  Soft, non-tender, with active bowel sounds, and no hepatosplenomegaly.  No  masses. SKIN:  Vitiligo involving face, chest, and hands.  No rashes, ulcers or lesions. EXTREMITIES: No edema, no skin discoloration or tenderness.  No palpable cords. LYMPH NODES: No palpable cervical, supraclavicular, axillary or inguinal adenopathy  NEUROLOGICAL: Unremarkable. PSYCH:  Appropriate.  Appointment on 11/12/2014  Component Date Value Ref Range Status  . WBC 11/12/2014 4.1  3.6 - 11.0 K/uL Final  . RBC 11/12/2014 5.41* 3.80 - 5.20 MIL/uL Final  . Hemoglobin 11/12/2014 14.2  12.0 - 16.0 g/dL Final  . HCT 11/12/2014 43.7  35.0 - 47.0 % Final  . MCV 11/12/2014 80.7  80.0 - 100.0 fL Final  . MCH 11/12/2014 26.2  26.0 - 34.0 pg Final  . MCHC 11/12/2014 32.4  32.0 - 36.0 g/dL Final  . RDW 11/12/2014 16.2* 11.5 - 14.5 % Final  . Platelets 11/12/2014 176  150 - 440 K/uL Final  . Neutrophils Relative % 11/12/2014 57   Final  . Neutro Abs 11/12/2014 2.3  1.4 - 6.5 K/uL Final  . Lymphocytes Relative 11/12/2014 28   Final  . Lymphs Abs 11/12/2014 1.1  1.0 - 3.6 K/uL Final  . Monocytes Relative 11/12/2014 14   Final  . Monocytes Absolute 11/12/2014 0.6  0.2 - 0.9 K/uL Final  . Eosinophils Relative 11/12/2014 0   Final  . Eosinophils Absolute 11/12/2014 0.0  0 - 0.7 K/uL Final  . Basophils Relative 11/12/2014 1   Final  . Basophils Absolute 11/12/2014 0.0  0 - 0.1 K/uL Final  . Sodium 11/12/2014 137  135 - 145 mmol/L Final  . Potassium 11/12/2014 3.8  3.5 - 5.1 mmol/L Final  . Chloride 11/12/2014 100* 101 - 111 mmol/L Final  . CO2 11/12/2014 29  22 - 32 mmol/L Final  . Glucose, Bld 11/12/2014 100* 65 - 99 mg/dL Final  . BUN 11/12/2014 20  6 - 20 mg/dL Final  . Creatinine, Ser 11/12/2014 0.96  0.44 - 1.00 mg/dL Final  . Calcium 11/12/2014 9.7  8.9 - 10.3 mg/dL Final  . Total Protein 11/12/2014 7.2  6.5 - 8.1 g/dL Final  . Albumin 11/12/2014 4.0  3.5 - 5.0 g/dL Final  . AST 11/12/2014 15  15 - 41 U/L Final  . ALT 11/12/2014 9* 14 - 54 U/L Final  . Alkaline Phosphatase  11/12/2014 69  38 - 126 U/L Final  . Total Bilirubin 11/12/2014 0.4  0.3 - 1.2 mg/dL Final  . GFR calc non Af Amer 11/12/2014 51* >60 mL/min Final  . GFR calc Af Amer 11/12/2014 59* >60 mL/min Final   Comment: (NOTE) The eGFR has been calculated using the CKD EPI equation. This calculation has not been validated in all clinical situations. eGFR's persistently <60 mL/min signify possible Chronic Kidney Disease.   . Anion gap 11/12/2014 8  5 - 15 Final    Assessment:  Alexandra Glass is a 81 y.o. female with a history of stage II right breast cancer and stage IIIB right-sided lung cancer.  She likely has bilateral renal cell  carcinoma.   She underwent a right modified radical mastectomy and node dissection on 02/04/2011. Pathology revealed grade II multifocal disease (largest lesion 3.6 cm).  There was angiolymphatic invasion. Nine lymph nodes were negative. She received radiation to the chest wall and lymphatics from 04/25/2011 until 06/15/2011.  She is on Femara.  Mammogram on 06/26/2013 was negative.  Bone density study on 03/19/2014 revealed osteopenia with a T score of -1.9 in L1-L4 and -1.6 in the right femur.  She was diagnosed with stage IIIB right lung cancer.  Pathology revealed differentiated adenocarcinoma consistent with lung primary. EGFR and ALK were negative. She received 6 weeks of concurrent carboplatin and Taxol from 08/08/2012 until 10/08/2012.  Chest, abdomen, and pelvic CT scan on 01/27/2014 revealed stable RUL pulmonary nodule, 12 x 7 mm ground glass attenuation of the LUL nodule, and multiple bilateral aggressive appearing renal lesions.  The largest was 4.5 x 3.7 cm in the left kidney.  Lesions were highly suspicious for renal cell carcinoma.  Given her age and bilateral disease, a decision by tumor board was for ongoing surveillance.  Symptomatically, the patient is a doing well on Femara. She denies any concerns.  Plan: 1. Labs today:  CBC with diff, CMP, CA27.29.    2. Discuss follow-up imaging studies to reassess lung cancer and likely renal cell cancer. 3. Schedule chest, abdomen, and pelvic CT scan. 4. Schedule left sided mammogram. 5. Obtain copy of last bone density study-done. 6. Discuss calcium and vitamin D. 7. Port flush today and every 4-6 weeks. 8. RTC in 2 weeks for MD assess and review of interval imaging studies.   Lequita Asal, MD  11/12/2014, 11:40 AM

## 2014-11-12 NOTE — Progress Notes (Signed)
Pt here today for follow up regarding  Breast cancer; pt c/o pain in right breast where she had surgery

## 2014-11-13 LAB — CANCER ANTIGEN 27.29: CA 27.29: 28.9 U/mL (ref 0.0–38.6)

## 2014-11-13 LAB — CEA: CEA: 2.5 ng/mL (ref 0.0–4.7)

## 2014-11-19 ENCOUNTER — Ambulatory Visit
Admission: RE | Admit: 2014-11-19 | Discharge: 2014-11-19 | Disposition: A | Discharge status: Home / Self Care | Payer: Medicare Other | Source: Ambulatory Visit | Attending: Hematology and Oncology | Admitting: Hematology and Oncology

## 2014-11-19 DIAGNOSIS — Z1231 Encounter for screening mammogram for malignant neoplasm of breast: Secondary | ICD-10-CM | POA: Diagnosis not present

## 2014-11-19 DIAGNOSIS — C50911 Malignant neoplasm of unspecified site of right female breast: Secondary | ICD-10-CM

## 2014-11-20 ENCOUNTER — Inpatient Hospital Stay: Payer: Medicare Other

## 2014-11-27 ENCOUNTER — Ambulatory Visit: Admission: RE | Admit: 2014-11-27 | Payer: Medicare Other | Source: Ambulatory Visit

## 2014-11-27 DIAGNOSIS — R413 Other amnesia: Secondary | ICD-10-CM | POA: Diagnosis not present

## 2014-11-28 ENCOUNTER — Ambulatory Visit: Payer: Medicare Other

## 2014-12-01 ENCOUNTER — Encounter: Payer: Self-pay | Admitting: Hematology and Oncology

## 2014-12-03 ENCOUNTER — Ambulatory Visit: Payer: Medicare Other | Admitting: Hematology and Oncology

## 2014-12-05 ENCOUNTER — Ambulatory Visit
Admission: RE | Admit: 2014-12-05 | Discharge: 2014-12-05 | Disposition: A | Discharge status: Home / Self Care | Payer: Medicare Other | Source: Ambulatory Visit | Attending: Hematology and Oncology | Admitting: Hematology and Oncology

## 2014-12-05 DIAGNOSIS — N2889 Other specified disorders of kidney and ureter: Secondary | ICD-10-CM

## 2014-12-05 DIAGNOSIS — C50911 Malignant neoplasm of unspecified site of right female breast: Secondary | ICD-10-CM | POA: Diagnosis not present

## 2014-12-05 DIAGNOSIS — R911 Solitary pulmonary nodule: Secondary | ICD-10-CM | POA: Insufficient documentation

## 2014-12-05 DIAGNOSIS — C3411 Malignant neoplasm of upper lobe, right bronchus or lung: Secondary | ICD-10-CM | POA: Diagnosis not present

## 2014-12-05 DIAGNOSIS — R918 Other nonspecific abnormal finding of lung field: Secondary | ICD-10-CM | POA: Diagnosis not present

## 2014-12-05 DIAGNOSIS — R590 Localized enlarged lymph nodes: Secondary | ICD-10-CM | POA: Insufficient documentation

## 2014-12-05 MED ORDER — IOHEXOL 300 MG/ML  SOLN
100.0000 mL | Freq: Once | INTRAMUSCULAR | Status: AC | PRN
  Administered 2014-12-05: 100 mL via INTRAVENOUS

## 2014-12-17 ENCOUNTER — Inpatient Hospital Stay: Payer: Medicare Other | Attending: Hematology and Oncology | Admitting: Hematology and Oncology

## 2014-12-17 VITALS — BP 135/62 | HR 58 | Temp 97.0°F | Resp 16 | Wt 132.3 lb

## 2014-12-17 DIAGNOSIS — C3412 Malignant neoplasm of upper lobe, left bronchus or lung: Secondary | ICD-10-CM | POA: Diagnosis present

## 2014-12-17 DIAGNOSIS — M8580 Other specified disorders of bone density and structure, unspecified site: Secondary | ICD-10-CM | POA: Diagnosis not present

## 2014-12-17 DIAGNOSIS — Z170 Estrogen receptor positive status [ER+]: Secondary | ICD-10-CM | POA: Diagnosis not present

## 2014-12-17 DIAGNOSIS — Z79811 Long term (current) use of aromatase inhibitors: Secondary | ICD-10-CM | POA: Diagnosis not present

## 2014-12-17 DIAGNOSIS — C3411 Malignant neoplasm of upper lobe, right bronchus or lung: Secondary | ICD-10-CM

## 2014-12-17 DIAGNOSIS — Z79899 Other long term (current) drug therapy: Secondary | ICD-10-CM

## 2014-12-17 DIAGNOSIS — I10 Essential (primary) hypertension: Secondary | ICD-10-CM | POA: Diagnosis not present

## 2014-12-17 DIAGNOSIS — C50911 Malignant neoplasm of unspecified site of right female breast: Secondary | ICD-10-CM

## 2014-12-17 DIAGNOSIS — N2889 Other specified disorders of kidney and ureter: Secondary | ICD-10-CM | POA: Diagnosis not present

## 2014-12-17 NOTE — Progress Notes (Signed)
[facility] day:  12/17/2014  Chief Complaint: Alexandra Glass is a 81 y.o. female with a history of stage II breast cancer and stage IIIB lung cancer who is seen for 1 month assessment and review of interval imaging studies.  HPI: The patient was last seen in the medical oncology clinic on 11/12/2014.  At that time,  she was seen for 3 month assessment.  Symptomatically, she was doing well on Femara for her breast cancer.  CXR on 07/07/2014 revealed a left upper lobe nodule as well as architectural distortion/nodularity of the medial right upper lobe.  Labs included a normal CBC with diff, CMP, and CA27.29 (28.9).  Per per plan, she underwent chest, abdomen, and pelvic CT scan 12/05/2014.  Imaging revealed mild progression of the 2.7 cm spiculated medial right upper lobe pulmonary nodule.  There was a stable 1.1 cm subsolid left upper lobe pulmonary nodule. There was mild progression of subcarinal mediastinal adenopathy. There were 4 heterogeneous enhancing solid renal masses (1 on the right and 3 on the left kidney) which were slightly enlarged and all suspicious for renal cell carcinoma. There was mild progression of left periaortic adenopathy likely metastatic from the dominant left renal cell carcinoma. She had a 2.6 cm infrarenal abdominal aortic aneurysm.  During the interim, she notes pain in her left side/abdomen.  She denies any hematuria. She denies any respiratory symptoms.   Past Medical History  Diagnosis Date  . Thyroid disease   . Hypertension   . Breast cancer     Past Surgical History  Procedure Laterality Date  . Tumor surg      removed from L) side  . Appendectomy    . Breast surgery Right   . Vaginal hysterectomy    . Mastectomy Right 02/04/11    chemo/rad- takes femara  . Breast biopsy Left     neg    Family History  Problem Relation Age of Onset  . Cancer Sister     Social History:  reports that she has never smoked.  She does not have any smokeless tobacco history on file. She reports that she does not drink alcohol or use illicit drugs.  The patient is alone today.  Allergies:  Allergies  Allergen Reactions  . No Known Allergies     Current Medications: Current Outpatient Prescriptions  Medication Sig Dispense Refill  . hydrochlorothiazide (HYDRODIURIL) 25 MG tablet TAKE ONE (1) TABLET BY MOUTH ONCE DAILY 30 tablet 8  . letrozole (FEMARA) 2.5 MG tablet TAKE ONE (1) TABLET BY MOUTH ONCE DAILY 30 tablet 0  . levothyroxine (SYNTHROID, LEVOTHROID) 100 MCG tablet TAKE ONE (1) TABLET BY MOUTH ONCE DAILY 30 tablet 8   No current facility-administered medications for this visit.    Review of Systems:  GENERAL:  Sometimes feels good.  No fevers. No fevers or sweats.  No weight loss. PERFORMANCE STATUS (ECOG):  2 HEENT: No visual changes, runny nose, sore throat, mouth sores or tenderness. Lungs:  Rare cough.  No shortness of breath.  No hemoptysis. Cardiac:  No chest pain, palpitations, orthopnea, or PND. GI:  Intermittent left sided abdominal pain.  No nausea, vomiting, diarrhea, constipation, melena or hematochezia. GU:  No urgency, frequency, dysuria, or hematuria. Musculoskeletal:  No back pain.  No joint pain.  No muscle tenderness. Extremities:  No pain or swelling. Skin:  No rashes or skin changes. Neuro:  Rare headache.  No numbness or weakness, balance or coordination issues. Endocrine:  No diabetes, thyroid issues, hot flashes or night sweats. Psych:  No mood changes, depression or anxiety. Pain:  No focal pain. Review of systems:  All other systems reviewed and found to be negative.  Physical Exam: Blood pressure 135/62, pulse 58, temperature 97 F (36.1 C), temperature source Tympanic, resp. rate 16, weight 132 lb 4.4 oz (60 kg). GENERAL:  Well developed, well nourished, sitting comfortably in the exam room in no acute distress. MENTAL STATUS:  Alert and oriented to person, place and  time. HEAD:  Graying hair pulled back.  Normocephalic, atraumatic, face symmetric, no Cushingoid features. EYES:  Brown eyes.  Pupils equal round and reactive to light and accomodation.  No conjunctivitis or scleral icterus. ENT:  Oropharynx clear without lesion.  Dentures.  Tongue normal. Mucous membranes moist.  RESPIRATORY:  Clear to auscultation without rales, wheezes or rhonchi. CARDIOVASCULAR:  Regular rate and rhythm without murmur, rub or gallop. ABDOMEN:  Soft, non-tender, with active bowel sounds, and no hepatosplenomegaly.  No masses. SKIN:  Vitiligo involving face, chest, and hands.  No rashes, ulcers or lesions. EXTREMITIES: No edema, no skin discoloration or tenderness.  No palpable cords. LYMPH NODES: No palpable cervical, supraclavicular, axillary or inguinal adenopathy  NEUROLOGICAL: Unremarkable. PSYCH:  Appropriate.  No visits with results within 3 Day(s) from this visit. Latest known visit with results is:  Appointment on 11/12/2014  Component Date Value Ref Range Status  . CA 27.29 11/12/2014 28.9  0.0 - 38.6 U/mL Final   Comment: (NOTE) Bayer Centaur/ACS methodology Performed At: [facility] [place], [place] [id number] Lindon Romp MD [id number]   . WBC 11/12/2014 4.1  3.6 - 11.0 K/uL Final  . RBC 11/12/2014 5.41* 3.80 - 5.20 MIL/uL Final  . Hemoglobin 11/12/2014 14.2  12.0 - 16.0 g/dL Final  . HCT 11/12/2014 43.7  35.0 - 47.0 % Final  . MCV 11/12/2014 80.7  80.0 - 100.0 fL Final  . MCH 11/12/2014 26.2  26.0 - 34.0 pg Final  . MCHC 11/12/2014 32.4  32.0 - 36.0 g/dL Final  . RDW 11/12/2014 16.2* 11.5 - 14.5 % Final  . Platelets 11/12/2014 176  150 - 440 K/uL Final  . Neutrophils Relative % 11/12/2014 57   Final  . Neutro Abs 11/12/2014 2.3  1.4 - 6.5 K/uL Final  . Lymphocytes Relative 11/12/2014 28   Final  . Lymphs Abs 11/12/2014 1.1  1.0 - 3.6 K/uL Final  . Monocytes Relative 11/12/2014 14   Final  . Monocytes Absolute  11/12/2014 0.6  0.2 - 0.9 K/uL Final  . Eosinophils Relative 11/12/2014 0   Final  . Eosinophils Absolute 11/12/2014 0.0  0 - 0.7 K/uL Final  . Basophils Relative 11/12/2014 1   Final  . Basophils Absolute 11/12/2014 0.0  0 - 0.1 K/uL Final  . Sodium 11/12/2014 137  135 - 145 mmol/L Final  . Potassium 11/12/2014 3.8  3.5 - 5.1 mmol/L Final  . Chloride 11/12/2014 100* 101 - 111 mmol/L Final  . CO2 11/12/2014 29  22 - 32 mmol/L Final  . Glucose, Bld 11/12/2014 100* 65 - 99 mg/dL Final  . BUN 11/12/2014 20  6 - 20 mg/dL Final  . Creatinine, Ser 11/12/2014 0.96  0.44 - 1.00 mg/dL Final  . Calcium 11/12/2014 9.7  8.9 - 10.3 mg/dL Final  . Total Protein 11/12/2014 7.2  6.5 - 8.1 g/dL Final  . Albumin 11/12/2014 4.0  3.5 - 5.0 g/dL Final  . AST 11/12/2014 15  15 - 41 U/L Final  . ALT 11/12/2014 9* 14 - 54 U/L Final  . Alkaline Phosphatase 11/12/2014 69  38 - 126 U/L Final  . Total Bilirubin 11/12/2014 0.4  0.3 - 1.2 mg/dL Final  . GFR calc non Af Amer 11/12/2014 51* >60 mL/min Final  . GFR calc Af Amer 11/12/2014 59* >60 mL/min Final   Comment: (NOTE) The eGFR has been calculated using the CKD EPI equation. This calculation has not been validated in all clinical situations. eGFR's persistently <60 mL/min signify possible Chronic Kidney Disease.   . Anion gap 11/12/2014 8  5 - 15 Final  . CEA 11/12/2014 2.5  0.0 - 4.7 ng/mL Final   Comment: (NOTE)       Roche ECLIA methodology       Nonsmokers  <3.9                                     Smokers     <5.6 Performed At: [facility] [place], [place] [id number] Lindon Romp MD [id number]:[id number]     Assessment:  Alexandra Glass is a 81 y.o. female with a history of stage II right breast cancer and stage IIIB right-sided lung cancer.  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   She underwent a right modified radical mastectomy and node dissection on 02/04/2011. Pathology revealed grade II multifocal disease (largest  lesion 3.6 cm).  There was angiolymphatic invasion. Nine lymph nodes were negative. She received radiation to the chest wall and lymphatics from 04/25/2011 until 06/15/2011.  She is on Femara.  CA27.29 was 28.9 on 11/12/2014.  Mammogram on 06/26/2013 was negative.  Bone density study on 03/19/2014 revealed osteopenia with a T score of -1.9 in L1-L4 and -1.6 in the right femur.  She was diagnosed with stage IIIB right lung cancer.  Pathology revealed differentiated adenocarcinoma consistent with lung primary. EGFR and ALK were negative. She received 6 weeks of concurrent carboplatin and Taxol from 08/08/2012 until 10/08/2012.  CEA was 2.5 on 11/12/2014.  Chest, abdomen, and pelvic CT scan on 01/27/2014 revealed stable RUL pulmonary nodule, 12 x 7 mm ground glass attenuation of the LUL nodule, and multiple bilateral aggressive appearing renal lesions.  The largest was 4.5 x 3.7 cm in the left kidney.  Lesions were highly suspicious for renal cell carcinoma.  Given her age and bilateral disease, a decision by tumor board was for ongoing surveillance.  Chest, abdomen, and pelvic CT scan 12/05/2014 revealed mild progression of the 2.7 cm spiculated medial right upper lobe pulmonary nodule.  There was a stable 1.1 cm subsolid left upper lobe pulmonary nodule. There was mild progression of subcarinal mediastinal adenopathy. There were 4 heterogeneous enhancing solid renal masses (1 on the right and 3 on the left kidney) which were slightly enlarged and all suspicious for renal cell carcinoma. There was mild progression of left periaortic adenopathy. She had a 2.6 cm infrarenal abdominal aortic aneurysm.  Symptomatically, she notes intermittent left sided abdominal pain.  She denies any hematuria.  Plan: 1. Review imaging.  Discuss progression of renal lesions. 2. Discuss patient's thoughts about biopsy and potential treatment for unresectable bilateral renal cell carcinoma. 3. Present at tumor board on  12/18/2014.   4. RTC in 1 week for follow-up.   Lequita Asal, MD  12/17/2014, 11:41 AM

## 2014-12-24 ENCOUNTER — Inpatient Hospital Stay: Payer: Medicare Other | Attending: Hematology and Oncology | Admitting: Hematology and Oncology

## 2014-12-24 VITALS — BP 173/81 | HR 62 | Temp 97.0°F | Wt 132.3 lb

## 2014-12-24 DIAGNOSIS — C3411 Malignant neoplasm of upper lobe, right bronchus or lung: Secondary | ICD-10-CM | POA: Diagnosis not present

## 2014-12-24 DIAGNOSIS — M8580 Other specified disorders of bone density and structure, unspecified site: Secondary | ICD-10-CM | POA: Insufficient documentation

## 2014-12-24 DIAGNOSIS — N2889 Other specified disorders of kidney and ureter: Secondary | ICD-10-CM | POA: Diagnosis not present

## 2014-12-24 DIAGNOSIS — Z79899 Other long term (current) drug therapy: Secondary | ICD-10-CM | POA: Insufficient documentation

## 2014-12-24 DIAGNOSIS — C50911 Malignant neoplasm of unspecified site of right female breast: Secondary | ICD-10-CM | POA: Insufficient documentation

## 2014-12-24 DIAGNOSIS — I10 Essential (primary) hypertension: Secondary | ICD-10-CM | POA: Insufficient documentation

## 2014-12-24 DIAGNOSIS — Z79811 Long term (current) use of aromatase inhibitors: Secondary | ICD-10-CM | POA: Insufficient documentation

## 2014-12-24 DIAGNOSIS — Z170 Estrogen receptor positive status [ER+]: Secondary | ICD-10-CM | POA: Diagnosis not present

## 2014-12-24 NOTE — Progress Notes (Signed)
[facility] day:  12/24/2014  Chief Complaint: Alexandra Glass is a 81 y.o. female with a history of stage II breast cancer and stage IIIB lung cancer who is seen for follow-up after tumor board discussions.  HPI: The patient was last seen in the medical oncology clinic on 12/17/2014.  At that time,  CT scans were reviewed from 12/05/2014. There was mild progression of 2.7 cm spiculated medial right upper lobe pulmonary nodule as well as mild progression of subcarinal mediastinal adenopathy. There were 4 heterogeneous enhancing solid renal masses and (while the right and 3 on the left) which were slightly enlarged in all suspicious for renal cell carcinoma. She had new left-sided abdominal pain.  We discussed her thoughts about biopsy and potential treatment for unresectable bilateral renal cell carcinoma.  The patient was presented at tumor board. A kidney or left periaortic node biopsy was discussed. Consideration was made for her age and bilateral disease and the risk versus benefit of therapy.  During the interim, she states that she "feels wonderful". She notes that her pain comes and goes and now has "gone away".  She notes a little bit of cramping in her legs for which bananas help.  Past Medical History  Diagnosis Date  . Thyroid disease   . Hypertension   . Breast cancer     Past Surgical History  Procedure Laterality Date  . Tumor surg      removed from L) side  . Appendectomy    . Breast surgery Right   . Vaginal hysterectomy    . Mastectomy Right 02/04/11    chemo/rad- takes femara  . Breast biopsy Left     neg    Family History  Problem Relation Age of Onset  . Cancer Sister     Social History:  reports that she has never smoked. She does not have any smokeless tobacco history on file. She reports that she does not drink alcohol or use illicit drugs.  The patient isaccompanied by Alexandra Glass, her daughter-in-law.  Allergies:   Allergies  Allergen Reactions  . No Known Allergies     Current Medications: Current Outpatient Prescriptions  Medication Sig Dispense Refill  . donepezil (ARICEPT) 5 MG tablet Take by mouth.    . hydrochlorothiazide (HYDRODIURIL) 25 MG tablet TAKE ONE (1) TABLET BY MOUTH ONCE DAILY 30 tablet 8  . letrozole (FEMARA) 2.5 MG tablet TAKE ONE (1) TABLET BY MOUTH ONCE DAILY 30 tablet 0  . levothyroxine (SYNTHROID, LEVOTHROID) 100 MCG tablet TAKE ONE (1) TABLET BY MOUTH ONCE DAILY 30 tablet 8   No current facility-administered medications for this visit.    Review of Systems:  GENERAL:  Feels wonderful.  No fevers or sweats.  No weight loss. PERFORMANCE STATUS (ECOG):  2 HEENT: No visual changes, runny nose, sore throat, mouth sores or tenderness. Lungs:  No cough or shortness of breath.  No hemoptysis. Cardiac:  No chest pain, palpitations, orthopnea, or PND. GI:  No nausea, vomiting, diarrhea, constipation, melena or hematochezia. GU:  No urgency, frequency, dysuria, or hematuria. Musculoskeletal:  No back pain.  No joint pain.  No muscle tenderness. Extremities:  No pain or swelling. Skin:  No rashes or skin changes. Neuro:  Sometimes headache.  No numbness or weakness, balance or coordination issues. Endocrine:  No diabetes, thyroid issues, hot flashes or night sweats. Psych:  No mood changes, depression or anxiety. Pain:  No focal pain. Review of systems:  All other  systems reviewed and found to be negative.  Physical Exam: Blood pressure 173/81, pulse 62, temperature 97 F (36.1 C), temperature source Tympanic, weight 132 lb 4.4 oz (60 kg). GENERAL:  Well developed, well nourished, sitting comfortably in the exam room in no acute distress. MENTAL STATUS:  Alert and oriented to person, place and time. HEAD:  Graying hair pulled back.  Normocephalic, atraumatic, face symmetric, no Cushingoid features. EYES:  Brown eyes.  No conjunctivitis or scleral icterus. PSYCH:   Appropriate.  No visits with results within 3 Day(s) from this visit. Latest known visit with results is:  Appointment on 11/12/2014  Component Date Value Ref Range Status  . CA 27.29 11/12/2014 28.9  0.0 - 38.6 U/mL Final   Comment: (NOTE) Bayer Centaur/ACS methodology Performed At: [facility] [place], [place] [id number] Lindon Romp MD [id number]   . WBC 11/12/2014 4.1  3.6 - 11.0 K/uL Final  . RBC 11/12/2014 5.41* 3.80 - 5.20 MIL/uL Final  . Hemoglobin 11/12/2014 14.2  12.0 - 16.0 g/dL Final  . HCT 11/12/2014 43.7  35.0 - 47.0 % Final  . MCV 11/12/2014 80.7  80.0 - 100.0 fL Final  . MCH 11/12/2014 26.2  26.0 - 34.0 pg Final  . MCHC 11/12/2014 32.4  32.0 - 36.0 g/dL Final  . RDW 11/12/2014 16.2* 11.5 - 14.5 % Final  . Platelets 11/12/2014 176  150 - 440 K/uL Final  . Neutrophils Relative % 11/12/2014 57   Final  . Neutro Abs 11/12/2014 2.3  1.4 - 6.5 K/uL Final  . Lymphocytes Relative 11/12/2014 28   Final  . Lymphs Abs 11/12/2014 1.1  1.0 - 3.6 K/uL Final  . Monocytes Relative 11/12/2014 14   Final  . Monocytes Absolute 11/12/2014 0.6  0.2 - 0.9 K/uL Final  . Eosinophils Relative 11/12/2014 0   Final  . Eosinophils Absolute 11/12/2014 0.0  0 - 0.7 K/uL Final  . Basophils Relative 11/12/2014 1   Final  . Basophils Absolute 11/12/2014 0.0  0 - 0.1 K/uL Final  . Sodium 11/12/2014 137  135 - 145 mmol/L Final  . Potassium 11/12/2014 3.8  3.5 - 5.1 mmol/L Final  . Chloride 11/12/2014 100* 101 - 111 mmol/L Final  . CO2 11/12/2014 29  22 - 32 mmol/L Final  . Glucose, Bld 11/12/2014 100* 65 - 99 mg/dL Final  . BUN 11/12/2014 20  6 - 20 mg/dL Final  . Creatinine, Ser 11/12/2014 0.96  0.44 - 1.00 mg/dL Final  . Calcium 11/12/2014 9.7  8.9 - 10.3 mg/dL Final  . Total Protein 11/12/2014 7.2  6.5 - 8.1 g/dL Final  . Albumin 11/12/2014 4.0  3.5 - 5.0 g/dL Final  . AST 11/12/2014 15  15 - 41 U/L Final  . ALT 11/12/2014 9* 14 - 54 U/L Final  . Alkaline  Phosphatase 11/12/2014 69  38 - 126 U/L Final  . Total Bilirubin 11/12/2014 0.4  0.3 - 1.2 mg/dL Final  . GFR calc non Af Amer 11/12/2014 51* >60 mL/min Final  . GFR calc Af Amer 11/12/2014 59* >60 mL/min Final   Comment: (NOTE) The eGFR has been calculated using the CKD EPI equation. This calculation has not been validated in all clinical situations. eGFR's persistently <60 mL/min signify possible Chronic Kidney Disease.   . Anion gap 11/12/2014 8  5 - 15 Final  . CEA 11/12/2014 2.5  0.0 - 4.7 ng/mL Final   Comment: (NOTE)       Roche ECLIA  methodology       Nonsmokers  <3.9                                     Smokers     <5.6 Performed At: [facility] [place], [place] [id number] Lindon Romp MD [id number]     Assessment:  Alexandra Glass is a 81 y.o. female with a history of stage II right breast cancer and stage IIIB right-sided lung cancer.  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   She underwent a right modified radical mastectomy and node dissection on 02/04/2011. Pathology revealed grade II multifocal disease (largest lesion 3.6 cm).  There was angiolymphatic invasion. Nine lymph nodes were negative. She received radiation to the chest wall and lymphatics from 04/25/2011 until 06/15/2011.  She is on Femara.  CA27.29 was 28.9 on 11/12/2014.  Mammogram on 06/26/2013 was negative.  Bone density study on 03/19/2014 revealed osteopenia with a T score of -1.9 in L1-L4 and -1.6 in the right femur.  She was diagnosed with stage IIIB right lung cancer in 2014.  Pathology revealed differentiated adenocarcinoma consistent with lung primary. EGFR and ALK were negative. She received 6 weeks of concurrent carboplatin and Taxol from 08/08/2012 until 10/08/2012.  CEA was 2.5 on 11/12/2014.  Chest, abdomen, and pelvic CT scan on 01/27/2014 revealed stable RUL pulmonary nodule, 12 x 7 mm ground glass attenuation of the LUL nodule, and multiple bilateral aggressive  appearing renal lesions.  The largest was 4.5 x 3.7 cm in the left kidney.  Lesions were highly suspicious for renal cell carcinoma.  Given her age and bilateral disease, a decision by tumor board was for ongoing surveillance.  Chest, abdomen, and pelvic CT scan 12/05/2014 revealed mild progression of the 2.7 cm spiculated medial right upper lobe pulmonary nodule. There was a stable 1.1 cm subsolid left upper lobe pulmonary nodule. There was mild progression of subcarinal mediastinal adenopathy. There were 4 heterogeneous enhancing solid renal masses (1 on the right and 3 on the left kidney) which were slightly enlarged and all suspicious for renal cell carcinoma. There was mild progression of left periaortic adenopathy. She had a 2.6 cm infrarenal abdominal aortic aneurysm.  Symptomatically, she denies any complaint.  Exam is stable.  Plan: 1.  Discuss imaging and tumor board discussions.  Discuss potential CT guided biopsy.  Discuss treatment with oral TKI (sunitinib versus pazopanib). Discuss side effects associated with therapy. Discuss response rates and progression free survival. Discussed patient's thoughts about therapy. Patient stated that she sees "no cents in it".  She is not interested in "anymore more cutting" (biopsy) and is "ready when God is ready to take me". I discuss monitoring her  disease. I offered information regarding Sutent and Votrient. The patient felt comfortable with this plan.  2.  Discuss code status issues as well as living will and medical power of attorney. Information was provided.  3.  Return to clinic in 2 months for MD assess and labs (CBC with diff, CMP).   Lequita Asal, MD  12/24/2014, 11:24 PM

## 2014-12-29 ENCOUNTER — Encounter: Payer: Self-pay | Admitting: Hematology and Oncology

## 2015-01-01 ENCOUNTER — Inpatient Hospital Stay: Payer: Medicare Other

## 2015-01-15 ENCOUNTER — Inpatient Hospital Stay: Payer: Medicare Other

## 2015-01-15 DIAGNOSIS — N2889 Other specified disorders of kidney and ureter: Secondary | ICD-10-CM | POA: Diagnosis not present

## 2015-01-15 DIAGNOSIS — Z79811 Long term (current) use of aromatase inhibitors: Secondary | ICD-10-CM | POA: Diagnosis not present

## 2015-01-15 DIAGNOSIS — I10 Essential (primary) hypertension: Secondary | ICD-10-CM | POA: Diagnosis not present

## 2015-01-15 DIAGNOSIS — Z79899 Other long term (current) drug therapy: Secondary | ICD-10-CM | POA: Diagnosis not present

## 2015-01-15 DIAGNOSIS — Z170 Estrogen receptor positive status [ER+]: Secondary | ICD-10-CM | POA: Diagnosis not present

## 2015-01-15 DIAGNOSIS — C50911 Malignant neoplasm of unspecified site of right female breast: Secondary | ICD-10-CM | POA: Diagnosis not present

## 2015-01-15 DIAGNOSIS — M8580 Other specified disorders of bone density and structure, unspecified site: Secondary | ICD-10-CM | POA: Diagnosis not present

## 2015-01-15 DIAGNOSIS — C3411 Malignant neoplasm of upper lobe, right bronchus or lung: Secondary | ICD-10-CM | POA: Diagnosis not present

## 2015-01-15 DIAGNOSIS — C801 Malignant (primary) neoplasm, unspecified: Secondary | ICD-10-CM

## 2015-01-15 MED ORDER — SODIUM CHLORIDE 0.9 % IJ SOLN
10.0000 mL | Freq: Once | INTRAMUSCULAR | Status: AC
  Administered 2015-01-15: 10 mL via INTRAVENOUS
  Filled 2015-01-15: qty 10

## 2015-01-15 MED ORDER — HEPARIN SOD (PORK) LOCK FLUSH 100 UNIT/ML IV SOLN
500.0000 [IU] | Freq: Once | INTRAVENOUS | Status: AC
  Administered 2015-01-15: 500 [IU] via INTRAVENOUS
  Filled 2015-01-15: qty 5

## 2015-01-16 ENCOUNTER — Inpatient Hospital Stay: Payer: Medicare Other

## 2015-02-04 ENCOUNTER — Ambulatory Visit: Payer: Medicare Other | Admitting: Radiation Oncology

## 2015-02-12 ENCOUNTER — Inpatient Hospital Stay: Payer: Medicare Other

## 2015-02-27 ENCOUNTER — Inpatient Hospital Stay: Payer: Medicare Other | Attending: Hematology and Oncology

## 2015-02-27 DIAGNOSIS — C50911 Malignant neoplasm of unspecified site of right female breast: Secondary | ICD-10-CM | POA: Insufficient documentation

## 2015-02-27 DIAGNOSIS — Z452 Encounter for adjustment and management of vascular access device: Secondary | ICD-10-CM | POA: Insufficient documentation

## 2015-02-27 DIAGNOSIS — Z170 Estrogen receptor positive status [ER+]: Secondary | ICD-10-CM | POA: Diagnosis not present

## 2015-02-27 DIAGNOSIS — C801 Malignant (primary) neoplasm, unspecified: Secondary | ICD-10-CM

## 2015-02-27 MED ORDER — HEPARIN SOD (PORK) LOCK FLUSH 100 UNIT/ML IV SOLN
INTRAVENOUS | Status: AC
  Filled 2015-02-27: qty 5

## 2015-02-27 MED ORDER — HEPARIN SOD (PORK) LOCK FLUSH 100 UNIT/ML IV SOLN
500.0000 [IU] | Freq: Once | INTRAVENOUS | Status: AC
  Administered 2015-02-27: 500 [IU] via INTRAVENOUS

## 2015-02-27 MED ORDER — SODIUM CHLORIDE 0.9 % IJ SOLN
10.0000 mL | INTRAMUSCULAR | Status: DC | PRN
  Administered 2015-02-27: 10 mL via INTRAVENOUS
  Filled 2015-02-27: qty 10

## 2015-03-03 DIAGNOSIS — F0390 Unspecified dementia without behavioral disturbance: Secondary | ICD-10-CM | POA: Diagnosis not present

## 2015-03-03 DIAGNOSIS — E538 Deficiency of other specified B group vitamins: Secondary | ICD-10-CM | POA: Insufficient documentation

## 2015-03-17 ENCOUNTER — Other Ambulatory Visit: Payer: Self-pay

## 2015-03-17 DIAGNOSIS — C50911 Malignant neoplasm of unspecified site of right female breast: Secondary | ICD-10-CM

## 2015-03-18 ENCOUNTER — Inpatient Hospital Stay: Payer: Medicare Other

## 2015-03-18 ENCOUNTER — Ambulatory Visit: Payer: Medicare Other | Admitting: Hematology and Oncology

## 2015-03-18 VITALS — BP 153/64 | HR 66 | Temp 97.0°F | Resp 18 | Ht 66.0 in | Wt 131.6 lb

## 2015-03-18 DIAGNOSIS — C50911 Malignant neoplasm of unspecified site of right female breast: Secondary | ICD-10-CM | POA: Diagnosis not present

## 2015-03-18 DIAGNOSIS — Z170 Estrogen receptor positive status [ER+]: Secondary | ICD-10-CM | POA: Diagnosis not present

## 2015-03-18 DIAGNOSIS — Z452 Encounter for adjustment and management of vascular access device: Secondary | ICD-10-CM | POA: Diagnosis not present

## 2015-03-18 DIAGNOSIS — C3411 Malignant neoplasm of upper lobe, right bronchus or lung: Secondary | ICD-10-CM

## 2015-03-18 DIAGNOSIS — N2889 Other specified disorders of kidney and ureter: Secondary | ICD-10-CM

## 2015-03-18 LAB — CBC WITH DIFFERENTIAL/PLATELET
Basophils Absolute: 0 10*3/uL (ref 0–0.1)
Basophils Relative: 0 %
Eosinophils Absolute: 0 10*3/uL (ref 0–0.7)
Eosinophils Relative: 0 %
HCT: 44.6 % (ref 35.0–47.0)
Hemoglobin: 14.4 g/dL (ref 12.0–16.0)
Lymphocytes Relative: 19 %
Lymphs Abs: 1 10*3/uL (ref 1.0–3.6)
MCH: 25.9 pg — ABNORMAL LOW (ref 26.0–34.0)
MCHC: 32.3 g/dL (ref 32.0–36.0)
MCV: 80.2 fL (ref 80.0–100.0)
Monocytes Absolute: 0.3 10*3/uL (ref 0.2–0.9)
Monocytes Relative: 6 %
Neutro Abs: 3.8 10*3/uL (ref 1.4–6.5)
Neutrophils Relative %: 75 %
Platelets: 185 10*3/uL (ref 150–440)
RBC: 5.55 MIL/uL — ABNORMAL HIGH (ref 3.80–5.20)
RDW: 16.1 % — ABNORMAL HIGH (ref 11.5–14.5)
WBC: 5 10*3/uL (ref 3.6–11.0)

## 2015-03-18 LAB — COMPREHENSIVE METABOLIC PANEL
ALT: 10 U/L — ABNORMAL LOW (ref 14–54)
AST: 20 U/L (ref 15–41)
Albumin: 4.3 g/dL (ref 3.5–5.0)
Alkaline Phosphatase: 59 U/L (ref 38–126)
Anion gap: 7 (ref 5–15)
BUN: 23 mg/dL — ABNORMAL HIGH (ref 6–20)
CO2: 27 mmol/L (ref 22–32)
Calcium: 10.2 mg/dL (ref 8.9–10.3)
Chloride: 107 mmol/L (ref 101–111)
Creatinine, Ser: 1.05 mg/dL — ABNORMAL HIGH (ref 0.44–1.00)
GFR calc Af Amer: 53 mL/min — ABNORMAL LOW (ref >60)
GFR calc non Af Amer: 46 mL/min — ABNORMAL LOW (ref >60)
Glucose, Bld: 159 mg/dL — ABNORMAL HIGH (ref 65–99)
Potassium: 3.2 mmol/L — ABNORMAL LOW (ref 3.5–5.1)
Sodium: 141 mmol/L (ref 135–145)
Total Bilirubin: 0.6 mg/dL (ref 0.3–1.2)
Total Protein: 7.5 g/dL (ref 6.5–8.1)

## 2015-03-18 NOTE — Progress Notes (Signed)
Pt reports cramping in both hands, otherwise no complaints or concerns.

## 2015-03-18 NOTE — Progress Notes (Signed)
[facility] day:  03/18/2015  Chief Complaint: Alexandra Glass is a 81 y.o. female with a history of stage II breast cancer and stage IIIB lung cancer who is seen for 3 month assessment.  HPI: The patient was last seen in the medical oncology clinic on 12/24/2014.  At that time, tumor board conversations was discussed.  We discussed potential CT guided biopsy. We discussed treatment with oral TKI (sunitinib versus pazopanib). We discussed side effects associated with therapy.  We discussed response rates and progression free survival.  She was not interested in biopsy or treatment. I discuss monitoring her disease. I discussed code status issues as well as living will and medical power of attorney. Information was provided.  During the interim, she has done well.  She comments that she is staying busy.  She doesn't sleep well.  She denies any pain.  Past Medical History  Diagnosis Date  . Thyroid disease   . Hypertension   . Breast cancer     Past Surgical History  Procedure Laterality Date  . Tumor surg      removed from L) side  . Appendectomy    . Breast surgery Right   . Vaginal hysterectomy    . Mastectomy Right 02/04/11    chemo/rad- takes femara  . Breast biopsy Left     neg    Family History  Problem Relation Age of Onset  . Cancer Sister     Social History:  reports that she has never smoked. She does not have any smokeless tobacco history on file. She reports that she does not drink alcohol or use illicit drugs.  The patient is accompanied by her son today.  Allergies:  Allergies  Allergen Reactions  . No Known Allergies     Current Medications: Current Outpatient Prescriptions  Medication Sig Dispense Refill  . donepezil (ARICEPT) 5 MG tablet Take by mouth.    . hydrochlorothiazide (HYDRODIURIL) 25 MG tablet TAKE ONE (1) TABLET BY MOUTH ONCE DAILY 30 tablet 8  . letrozole (FEMARA) 2.5 MG tablet TAKE ONE (1) TABLET BY  MOUTH ONCE DAILY 30 tablet 0  . levothyroxine (SYNTHROID, LEVOTHROID) 100 MCG tablet TAKE ONE (1) TABLET BY MOUTH ONCE DAILY 30 tablet 8   No current facility-administered medications for this visit.    Review of Systems:  GENERAL:  Feels fine.  Stays busy.  No fevers or sweats.  Weight down 1 pound. PERFORMANCE STATUS (ECOG):  2 HEENT: No visual changes, runny nose, sore throat, mouth sores or tenderness. Lungs:  No cough or shortness of breath.  No hemoptysis. Cardiac:  No chest pain, palpitations, orthopnea, or PND. GI:  No nausea, vomiting, diarrhea, constipation, melena or hematochezia. GU:  No urgency, frequency, dysuria, or hematuria. Musculoskeletal:  No back pain.  No joint pain.  No muscle tenderness. Extremities:  Cramping in hands.  No pain or swelling. Skin:  No rashes or skin changes. Neuro:  No headache, numbness or weakness, balance or coordination issues. Endocrine:  No diabetes, thyroid issues, hot flashes or night sweats. Psych:  Poor sleep.  No mood changes, depression or anxiety. Pain:  No focal pain. Review of systems:  All other systems reviewed and found to be negative.  Physical Exam: Blood pressure 153/64, pulse 66, temperature 97 F (36.1 C), temperature source Tympanic, resp. rate 18, height 5' 6"  (1.676 m), weight 131 lb 9.8 oz (59.7 kg). GENERAL:  Well developed, well nourished, sitting comfortably in the  exam room in no acute distress. MENTAL STATUS:  Alert and oriented to person, place and time. HEAD:  Graying hair pulled back.  Normocephalic, atraumatic, face symmetric, no Cushingoid features. EYES:  Brown eyes.  Pupils equal round and reactive to light and accomodation.  No conjunctivitis or scleral icterus. ENT:  Oropharynx clear without lesion.  Dentures.  Tongue normal. Mucous membranes moist.  RESPIRATORY:  Clear to auscultation without rales, wheezes or rhonchi. CARDIOVASCULAR:  Regular rate and rhythm without murmur, rub or gallop. ABDOMEN:   Soft, non-tender, with active bowel sounds, and no hepatosplenomegaly.  No masses. SKIN:  Vitiligo involving face, chest, and hands.  No rashes, ulcers or lesions. EXTREMITIES: No edema, no skin discoloration or tenderness.  No palpable cords. LYMPH NODES: No palpable cervical, supraclavicular, axillary or inguinal adenopathy  NEUROLOGICAL: Unremarkable. PSYCH:  Appropriate.  Appointment on 03/18/2015  Component Date Value Ref Range Status  . WBC 03/18/2015 5.0  3.6 - 11.0 K/uL Final  . RBC 03/18/2015 5.55* 3.80 - 5.20 MIL/uL Final  . Hemoglobin 03/18/2015 14.4  12.0 - 16.0 g/dL Final  . HCT 03/18/2015 44.6  35.0 - 47.0 % Final  . MCV 03/18/2015 80.2  80.0 - 100.0 fL Final  . MCH 03/18/2015 25.9* 26.0 - 34.0 pg Final  . MCHC 03/18/2015 32.3  32.0 - 36.0 g/dL Final  . RDW 03/18/2015 16.1* 11.5 - 14.5 % Final  . Platelets 03/18/2015 185  150 - 440 K/uL Final  . Neutrophils Relative % 03/18/2015 75   Final  . Neutro Abs 03/18/2015 3.8  1.4 - 6.5 K/uL Final  . Lymphocytes Relative 03/18/2015 19   Final  . Lymphs Abs 03/18/2015 1.0  1.0 - 3.6 K/uL Final  . Monocytes Relative 03/18/2015 6   Final  . Monocytes Absolute 03/18/2015 0.3  0.2 - 0.9 K/uL Final  . Eosinophils Relative 03/18/2015 0   Final  . Eosinophils Absolute 03/18/2015 0.0  0 - 0.7 K/uL Final  . Basophils Relative 03/18/2015 0   Final  . Basophils Absolute 03/18/2015 0.0  0 - 0.1 K/uL Final  . Sodium 03/18/2015 141  135 - 145 mmol/L Final  . Potassium 03/18/2015 3.2* 3.5 - 5.1 mmol/L Final  . Chloride 03/18/2015 107  101 - 111 mmol/L Final  . CO2 03/18/2015 27  22 - 32 mmol/L Final  . Glucose, Bld 03/18/2015 159* 65 - 99 mg/dL Final  . BUN 03/18/2015 23* 6 - 20 mg/dL Final  . Creatinine, Ser 03/18/2015 1.05* 0.44 - 1.00 mg/dL Final  . Calcium 03/18/2015 10.2  8.9 - 10.3 mg/dL Final  . Total Protein 03/18/2015 7.5  6.5 - 8.1 g/dL Final  . Albumin 03/18/2015 4.3  3.5 - 5.0 g/dL Final  . AST 03/18/2015 20  15 - 41 U/L Final   . ALT 03/18/2015 10* 14 - 54 U/L Final  . Alkaline Phosphatase 03/18/2015 59  38 - 126 U/L Final  . Total Bilirubin 03/18/2015 0.6  0.3 - 1.2 mg/dL Final  . GFR calc non Af Amer 03/18/2015 46* >60 mL/min Final  . GFR calc Af Amer 03/18/2015 53* >60 mL/min Final   Comment: (NOTE) The eGFR has been calculated using the CKD EPI equation. This calculation has not been validated in all clinical situations. eGFR's persistently <60 mL/min signify possible Chronic Kidney Disease.   . Anion gap 03/18/2015 7  5 - 15 Final    Assessment:  Brittane Grudzinski is a 81 y.o. female with a history of stage II right breast  cancer and stage IIIB right-sided lung cancer.  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   She underwent a right modified radical mastectomy and node dissection on 02/04/2011. Pathology revealed grade II multifocal disease (largest lesion 3.6 cm).  There was angiolymphatic invasion. Nine lymph nodes were negative. She received radiation to the chest wall and lymphatics from 04/25/2011 until 06/15/2011.  She is on Femara.  CA27.29 was 28.9 on 11/12/2014.  Mammogram on 06/26/2013 was negative.  Bone density study on 03/19/2014 revealed osteopenia with a T score of -1.9 in L1-L4 and -1.6 in the right femur.  She was diagnosed with stage IIIB right lung cancer in 2014.  Pathology revealed differentiated adenocarcinoma consistent with lung primary. EGFR and ALK were negative. She received 6 weeks of concurrent carboplatin and Taxol from 08/08/2012 until 10/08/2012.  CEA was 2.5 on 11/12/2014.  Chest, abdomen, and pelvic CT scan on 01/27/2014 revealed stable RUL pulmonary nodule, 12 x 7 mm ground glass attenuation of the LUL nodule, and multiple bilateral aggressive appearing renal lesions.  The largest was 4.5 x 3.7 cm in the left kidney.  Lesions were highly suspicious for renal cell carcinoma.  Given her age and bilateral disease, a decision by tumor board was for ongoing surveillance.  Chest,  abdomen, and pelvic CT scan 12/05/2014 revealed mild progression of the 2.7 cm spiculated medial right upper lobe pulmonary nodule. There was a stable 1.1 cm subsolid left upper lobe pulmonary nodule. There was mild progression of subcarinal mediastinal adenopathy. There were 4 heterogeneous enhancing solid renal masses (1 on the right and 3 on the left kidney) which were slightly enlarged and all suspicious for renal cell carcinoma. There was mild progression of left periaortic adenopathy. She had a 2.6 cm infrarenal abdominal aortic aneurysm.  She is not interested in biopsy or treatment for the enlarging renal masses.  Symptomatically, she feels good.  Exam is stable.  Plan: 1.  Labs today: CBC with diff, CMP. 2.  Port flush every 6-8 weeks. 3.  Chest, abdomen, and pelvic CT scan on 06/07/2015. 4.  RTC after imaging for MD assessment, labs (CBC with diff, CMP, CA27.29, CEA), and review of CT scans.   Lequita Asal, MD  03/18/2015, 11:29 AM

## 2015-03-26 ENCOUNTER — Inpatient Hospital Stay: Payer: Medicare Other

## 2015-03-26 ENCOUNTER — Encounter: Payer: Self-pay | Admitting: Family Medicine

## 2015-03-26 ENCOUNTER — Ambulatory Visit (INDEPENDENT_AMBULATORY_CARE_PROVIDER_SITE_OTHER): Payer: Medicare Other | Admitting: Family Medicine

## 2015-03-26 VITALS — BP 112/62 | HR 70 | Ht 66.0 in | Wt 131.0 lb

## 2015-03-26 DIAGNOSIS — I10 Essential (primary) hypertension: Secondary | ICD-10-CM

## 2015-03-26 DIAGNOSIS — E039 Hypothyroidism, unspecified: Secondary | ICD-10-CM

## 2015-03-26 MED ORDER — LEVOTHYROXINE SODIUM 100 MCG PO TABS: ORAL_TABLET | ORAL | Status: DC

## 2015-03-26 MED ORDER — HYDROCHLOROTHIAZIDE 25 MG PO TABS: ORAL_TABLET | ORAL | Status: DC

## 2015-03-26 NOTE — Progress Notes (Signed)
Name: Alexandra Glass   MRN: [id number]    DOB: [birth year]   Date:03/26/2015       Progress Note  Subjective  Chief Complaint  Chief Complaint  Patient presents with  . Hypothyroidism  . Hypertension    Hypertension This is a chronic problem. The current episode started more than 1 year ago. The problem has been gradually improving since onset. The problem is controlled. Pertinent negatives include no anxiety, blurred vision, chest pain, headaches, malaise/fatigue, neck pain, orthopnea, palpitations, peripheral edema, PND, shortness of breath or sweats. There are no associated agents to hypertension. There are no known risk factors for coronary artery disease. Hypertensive end-organ damage includes a thyroid problem. There is no history of heart failure.  Thyroid Problem Patient reports no anxiety, cold intolerance, constipation, depressed mood, diaphoresis, diarrhea, dry skin, fatigue, hair loss, heat intolerance, hoarse voice, leg swelling, menstrual problem, nail problem, palpitations, tremors, visual change, weight gain or weight loss. The symptoms have been stable. Past treatments include levothyroxine. The treatment provided mild relief. There is no history of atrial fibrillation, dementia, diabetes, Graves' ophthalmopathy, heart failure, hyperlipidemia, neuropathy, obesity or osteopenia. There are no known risk factors.    No problem-specific assessment & plan notes found for this encounter.   Past Medical History  Diagnosis Date  . Thyroid disease   . Hypertension   . Breast cancer [facility])     Past Surgical History  Procedure Laterality Date  . Tumor surg      removed from L) side  . Appendectomy    . Breast surgery Right   . Vaginal hysterectomy    . Mastectomy Right 02/04/11    chemo/rad- takes femara  . Breast biopsy Left     neg    Family History  Problem Relation Age of Onset  . Cancer Sister     Social History   Social History  . Marital Status: Single   Spouse Name: N/A  . Number of Children: N/A  . Years of Education: N/A   Occupational History  . Not on file.   Social History Main Topics  . Smoking status: Never Smoker   . Smokeless tobacco: Not on file  . Alcohol Use: No  . Drug Use: No  . Sexual Activity: No   Other Topics Concern  . Not on file   Social History Narrative    Allergies  Allergen Reactions  . No Known Allergies      Review of Systems  Constitutional: Negative for fever, chills, weight loss, weight gain, malaise/fatigue, diaphoresis and fatigue.  HENT: Negative for ear discharge, ear pain, hoarse voice and sore throat.   Eyes: Negative for blurred vision.  Respiratory: Negative for cough, sputum production, shortness of breath and wheezing.   Cardiovascular: Negative for chest pain, palpitations, orthopnea, leg swelling and PND.  Gastrointestinal: Negative for heartburn, nausea, abdominal pain, diarrhea, constipation, blood in stool and melena.  Genitourinary: Negative for dysuria, urgency, frequency, hematuria and menstrual problem.  Musculoskeletal: Negative for myalgias, back pain, joint pain and neck pain.  Skin: Negative for rash.  Neurological: Negative for dizziness, tingling, tremors, sensory change, focal weakness and headaches.  Endo/Heme/Allergies: Negative for environmental allergies, cold intolerance, heat intolerance and polydipsia. Does not bruise/bleed easily.  Psychiatric/Behavioral: Negative for depression and suicidal ideas. The patient is not nervous/anxious and does not have insomnia.      Objective  Filed Vitals:   03/26/15 1515  BP: 112/62  Pulse: 70  Height: '5\' 6"'$  (1.676 m)  Weight:  131 lb (59.421 kg)    Physical Exam  Constitutional: She is well-developed, well-nourished, and in no distress. No distress.  HENT:  Head: Normocephalic and atraumatic.  Right Ear: External ear normal.  Left Ear: External ear normal.  Nose: Nose normal.  Mouth/Throat: Oropharynx is clear  and moist.  Eyes: Conjunctivae and EOM are normal. Pupils are equal, round, and reactive to light. Right eye exhibits no discharge. Left eye exhibits no discharge.  Neck: Normal range of motion. Neck supple. No JVD present. No thyromegaly present.  Cardiovascular: Normal rate, regular rhythm, normal heart sounds and intact distal pulses.  Exam reveals no gallop and no friction rub.   No murmur heard. Pulmonary/Chest: Effort normal and breath sounds normal.  Abdominal: Soft. Bowel sounds are normal. She exhibits no mass. There is no tenderness. There is no guarding.  Musculoskeletal: Normal range of motion. She exhibits no edema.  Lymphadenopathy:    She has no cervical adenopathy.  Neurological: She is alert. She has normal reflexes.  Skin: Skin is warm and dry. She is not diaphoretic.  Psychiatric: Mood and affect normal.  Nursing note and vitals reviewed.     Assessment & Plan  Problem List Items Addressed This Visit    None    Visit Diagnoses    Essential hypertension    -  Primary    Relevant Medications    hydrochlorothiazide (HYDRODIURIL) 25 MG tablet    Other Relevant Orders    Renal Function Panel    Hypothyroidism, unspecified hypothyroidism type        Relevant Medications    levothyroxine (SYNTHROID, LEVOTHROID) 100 MCG tablet    Other Relevant Orders    TSH         Dr. Arietta Eisenstein [facility]  03/26/2015

## 2015-03-27 LAB — RENAL FUNCTION PANEL
ALBUMIN: 4.3 g/dL (ref 3.5–4.7)
BUN/Creatinine Ratio: 15 (ref 11–26)
BUN: 13 mg/dL (ref 8–27)
CO2: 24 mmol/L (ref 18–29)
CREATININE: 0.86 mg/dL (ref 0.57–1.00)
Calcium: 10.1 mg/dL (ref 8.7–10.3)
Chloride: 104 mmol/L (ref 97–106)
GFR calc Af Amer: 70 mL/min/{1.73_m2} (ref >59)
GFR, EST NON AFRICAN AMERICAN: 61 mL/min/{1.73_m2} (ref >59)
Glucose: 100 mg/dL — ABNORMAL HIGH (ref 65–99)
PHOSPHORUS: 3.2 mg/dL (ref 2.5–4.5)
Potassium: 3.3 mmol/L — ABNORMAL LOW (ref 3.5–5.2)
Sodium: 146 mmol/L — ABNORMAL HIGH (ref 136–144)

## 2015-03-27 LAB — TSH: TSH: 0.016 u[IU]/mL — ABNORMAL LOW (ref 0.450–4.500)

## 2015-03-27 MED ORDER — LEVOTHYROXINE SODIUM 88 MCG PO TABS: 88.0000 ug | ORAL_TABLET | Freq: Every day | ORAL | Status: DC

## 2015-03-27 NOTE — Addendum Note (Signed)
Addended by: Fredderick Severance on: 03/27/2015 01:28 PM   Modules accepted: Orders, Medications

## 2015-04-01 ENCOUNTER — Ambulatory Visit: Payer: Medicare Other | Admitting: Family Medicine

## 2015-04-10 ENCOUNTER — Inpatient Hospital Stay: Payer: Medicare Other

## 2015-05-21 ENCOUNTER — Inpatient Hospital Stay: Payer: Medicare Other | Attending: Hematology and Oncology

## 2015-05-21 DIAGNOSIS — I10 Essential (primary) hypertension: Secondary | ICD-10-CM | POA: Diagnosis not present

## 2015-05-21 DIAGNOSIS — M8580 Other specified disorders of bone density and structure, unspecified site: Secondary | ICD-10-CM | POA: Insufficient documentation

## 2015-05-21 DIAGNOSIS — Z452 Encounter for adjustment and management of vascular access device: Secondary | ICD-10-CM | POA: Insufficient documentation

## 2015-05-21 DIAGNOSIS — C50911 Malignant neoplasm of unspecified site of right female breast: Secondary | ICD-10-CM | POA: Insufficient documentation

## 2015-05-21 DIAGNOSIS — Z79811 Long term (current) use of aromatase inhibitors: Secondary | ICD-10-CM | POA: Insufficient documentation

## 2015-05-21 DIAGNOSIS — N2889 Other specified disorders of kidney and ureter: Secondary | ICD-10-CM | POA: Insufficient documentation

## 2015-05-21 DIAGNOSIS — Z9011 Acquired absence of right breast and nipple: Secondary | ICD-10-CM | POA: Insufficient documentation

## 2015-05-21 DIAGNOSIS — Z95828 Presence of other vascular implants and grafts: Secondary | ICD-10-CM

## 2015-05-21 DIAGNOSIS — Z9221 Personal history of antineoplastic chemotherapy: Secondary | ICD-10-CM | POA: Insufficient documentation

## 2015-05-21 DIAGNOSIS — Z170 Estrogen receptor positive status [ER+]: Secondary | ICD-10-CM | POA: Insufficient documentation

## 2015-05-21 DIAGNOSIS — C3411 Malignant neoplasm of upper lobe, right bronchus or lung: Secondary | ICD-10-CM | POA: Diagnosis not present

## 2015-05-21 MED ORDER — HEPARIN SOD (PORK) LOCK FLUSH 100 UNIT/ML IV SOLN
500.0000 [IU] | Freq: Once | INTRAVENOUS | Status: AC
  Administered 2015-05-21: 500 [IU] via INTRAVENOUS

## 2015-05-21 MED ORDER — SODIUM CHLORIDE 0.9% FLUSH
10.0000 mL | INTRAVENOUS | Status: DC | PRN
  Administered 2015-05-21: 10 mL via INTRAVENOUS
  Filled 2015-05-21: qty 10

## 2015-05-22 ENCOUNTER — Inpatient Hospital Stay: Payer: Medicare Other

## 2015-06-08 ENCOUNTER — Encounter: Payer: Self-pay | Admitting: Hematology and Oncology

## 2015-06-09 ENCOUNTER — Ambulatory Visit
Admission: RE | Admit: 2015-06-09 | Discharge: 2015-06-09 | Disposition: A | Discharge status: Home / Self Care | Payer: Medicare Other | Source: Ambulatory Visit | Attending: Hematology and Oncology | Admitting: Hematology and Oncology

## 2015-06-09 DIAGNOSIS — J984 Other disorders of lung: Secondary | ICD-10-CM | POA: Insufficient documentation

## 2015-06-09 DIAGNOSIS — R911 Solitary pulmonary nodule: Secondary | ICD-10-CM | POA: Insufficient documentation

## 2015-06-09 DIAGNOSIS — C3411 Malignant neoplasm of upper lobe, right bronchus or lung: Secondary | ICD-10-CM | POA: Insufficient documentation

## 2015-06-09 DIAGNOSIS — N2889 Other specified disorders of kidney and ureter: Secondary | ICD-10-CM | POA: Diagnosis not present

## 2015-06-09 DIAGNOSIS — I517 Cardiomegaly: Secondary | ICD-10-CM | POA: Insufficient documentation

## 2015-06-09 DIAGNOSIS — I2510 Atherosclerotic heart disease of native coronary artery without angina pectoris: Secondary | ICD-10-CM | POA: Insufficient documentation

## 2015-06-09 DIAGNOSIS — R590 Localized enlarged lymph nodes: Secondary | ICD-10-CM | POA: Insufficient documentation

## 2015-06-10 ENCOUNTER — Ambulatory Visit (HOSPITAL_BASED_OUTPATIENT_CLINIC_OR_DEPARTMENT_OTHER): Payer: Medicare Other | Admitting: Hematology and Oncology

## 2015-06-10 ENCOUNTER — Inpatient Hospital Stay: Payer: Medicare Other

## 2015-06-10 ENCOUNTER — Telehealth: Payer: Self-pay

## 2015-06-10 VITALS — BP 134/60 | HR 57 | Temp 97.7°F | Resp 18 | Wt 133.2 lb

## 2015-06-10 DIAGNOSIS — C50911 Malignant neoplasm of unspecified site of right female breast: Secondary | ICD-10-CM | POA: Diagnosis not present

## 2015-06-10 DIAGNOSIS — I10 Essential (primary) hypertension: Secondary | ICD-10-CM | POA: Diagnosis not present

## 2015-06-10 DIAGNOSIS — Z9011 Acquired absence of right breast and nipple: Secondary | ICD-10-CM | POA: Diagnosis not present

## 2015-06-10 DIAGNOSIS — Z452 Encounter for adjustment and management of vascular access device: Secondary | ICD-10-CM | POA: Diagnosis not present

## 2015-06-10 DIAGNOSIS — Z170 Estrogen receptor positive status [ER+]: Secondary | ICD-10-CM

## 2015-06-10 DIAGNOSIS — Z79811 Long term (current) use of aromatase inhibitors: Secondary | ICD-10-CM | POA: Diagnosis not present

## 2015-06-10 DIAGNOSIS — M8580 Other specified disorders of bone density and structure, unspecified site: Secondary | ICD-10-CM | POA: Diagnosis not present

## 2015-06-10 DIAGNOSIS — Z9221 Personal history of antineoplastic chemotherapy: Secondary | ICD-10-CM | POA: Diagnosis not present

## 2015-06-10 DIAGNOSIS — N2889 Other specified disorders of kidney and ureter: Secondary | ICD-10-CM

## 2015-06-10 DIAGNOSIS — C3411 Malignant neoplasm of upper lobe, right bronchus or lung: Secondary | ICD-10-CM

## 2015-06-10 LAB — COMPREHENSIVE METABOLIC PANEL
ALT: 9 U/L — ABNORMAL LOW (ref 14–54)
AST: 18 U/L (ref 15–41)
Albumin: 3.9 g/dL (ref 3.5–5.0)
Alkaline Phosphatase: 55 U/L (ref 38–126)
Anion gap: 4 — ABNORMAL LOW (ref 5–15)
BUN: 22 mg/dL — ABNORMAL HIGH (ref 6–20)
CO2: 27 mmol/L (ref 22–32)
Calcium: 9.1 mg/dL (ref 8.9–10.3)
Chloride: 109 mmol/L (ref 101–111)
Creatinine, Ser: 0.84 mg/dL (ref 0.44–1.00)
GFR calc Af Amer: >60 mL/min (ref >60)
GFR calc non Af Amer: >60 mL/min (ref >60)
Glucose, Bld: 124 mg/dL — ABNORMAL HIGH (ref 65–99)
Potassium: 3.1 mmol/L — ABNORMAL LOW (ref 3.5–5.1)
Sodium: 140 mmol/L (ref 135–145)
Total Bilirubin: 0.4 mg/dL (ref 0.3–1.2)
Total Protein: 6.7 g/dL (ref 6.5–8.1)

## 2015-06-10 LAB — CBC WITH DIFFERENTIAL/PLATELET
Basophils Absolute: 0 10*3/uL (ref 0–0.1)
Basophils Relative: 1 %
Eosinophils Absolute: 0 10*3/uL (ref 0–0.7)
Eosinophils Relative: 0 %
HCT: 42.7 % (ref 35.0–47.0)
Hemoglobin: 13.9 g/dL (ref 12.0–16.0)
Lymphocytes Relative: 25 %
Lymphs Abs: 1 10*3/uL (ref 1.0–3.6)
MCH: 26.5 pg (ref 26.0–34.0)
MCHC: 32.5 g/dL (ref 32.0–36.0)
MCV: 81.7 fL (ref 80.0–100.0)
Monocytes Absolute: 0.4 10*3/uL (ref 0.2–0.9)
Monocytes Relative: 10 %
Neutro Abs: 2.7 10*3/uL (ref 1.4–6.5)
Neutrophils Relative %: 64 %
Platelets: 205 10*3/uL (ref 150–440)
RBC: 5.23 MIL/uL — ABNORMAL HIGH (ref 3.80–5.20)
RDW: 16.3 % — ABNORMAL HIGH (ref 11.5–14.5)
WBC: 4.2 10*3/uL (ref 3.6–11.0)

## 2015-06-10 NOTE — Telephone Encounter (Signed)
Called and spoke with pt per MD. Pt takes hydrochlorothiazide (HYDRODIURIL) 25 MG Daily. Informed pt that potassium is low today and that I would fax results to PCP Dr. Ronnald Ramp.  Pt verbalized an understanding no other concerns noted

## 2015-06-10 NOTE — Progress Notes (Signed)
[facility] day:  06/10/2015  Chief Complaint: Alexandra Glass is a 82 y.o. female with a history of stage II breast cancer and stage IIIB lung cancer who is seen for 3 month assessment and review of interval imaging studies.  HPI: The patient was last seen in the medical oncology clinic on 03/18/2015.  At that time, she was done well.  She was staying busy.  She denied any pain.  She was not interested in biopsy or treatment for the enlarging renal masses.  She was agreeable to close follow-up.  Chest, abdomen, and pelvic CT scan on 06/09/2015 revealed a mild interval decrease size (2.4 x 1.7 cm) of medial right upper lobe pulmonary nodule.   There has been interval stability (1.1 x 0.8 cm) of previously described subsolid left upper lobe pulmonary nodule.   The newly described pure ground-glass apical left upper lobe pulmonary nodule had mildly increased in size (1.4 x 1.2 cm), cannot exclude metachronous adenocarcinoma.  There was stable mild subcarinal mediastinal and left para-aortic retroperitoneal lymphadenopathy.  There was stable bilateral indeterminate renal masses, suspicious for renal cell carcinoma based on prior contrast-enhanced studies.  There was stable cystic lung disease, favor Langerhans cell histiocytosis.  There was stable ectasia of the atherosclerotic descending thoracic and infrarenal abdominal aorta.  There was cardiomegaly and coronary atherosclerosi.s  Symptomatically, she feels good.  She is active at home and in her garden.  She denies any pain.  She denies any respiratory symptoms or abdominal symptoms.  She denies any hematuria.  She continues to take her letrozole. She notes tightness under her arm associated with her prior surgery.  Past Medical History  Diagnosis Date  . Thyroid disease   . Hypertension   . Breast cancer [facility])     Past Surgical History  Procedure Laterality Date  . Tumor surg      removed from L) side   . Appendectomy    . Breast surgery Right   . Vaginal hysterectomy    . Mastectomy Right 02/04/11    chemo/rad- takes femara  . Breast biopsy Left     neg    Family History  Problem Relation Age of Onset  . Cancer Sister     Social History:  reports that she has never smoked. She does not have any smokeless tobacco history on file. She reports that she does not drink alcohol or use illicit drugs.  The patient is accompanied by her son, [name],  today.  Allergies:  Allergies  Allergen Reactions  . No Known Allergies     Current Medications: Current Outpatient Prescriptions  Medication Sig Dispense Refill  . donepezil (ARICEPT) 10 MG tablet Take 10 mg by mouth at bedtime. Potter    . hydrochlorothiazide (HYDRODIURIL) 25 MG tablet TAKE ONE (1) TABLET BY MOUTH ONCE DAILY 30 tablet 8  . letrozole (FEMARA) 2.5 MG tablet TAKE ONE (1) TABLET BY MOUTH ONCE DAILY 30 tablet 0  . levothyroxine (SYNTHROID, LEVOTHROID) 88 MCG tablet Take 1 tablet (88 mcg total) by mouth daily. 90 tablet 0  . vitamin B-12 (CYANOCOBALAMIN) 1000 MCG tablet Take 1,000 mcg by mouth daily.     No current facility-administered medications for this visit.    Review of Systems:  GENERAL:  Feels good.  Stays busy.  No fevers or sweats.  Weight up 2 pounds. PERFORMANCE STATUS (ECOG):  2 HEENT: No visual changes, runny nose, sore throat, mouth sores or tenderness. Lungs:  No  cough or shortness of breath.  No hemoptysis. Cardiac:  No chest pain, palpitations, orthopnea, or PND. Breasts:  No concerns.  Tightness across chest and under arm at site of prior surgery. GI:  No nausea, vomiting, diarrhea, constipation, melena or hematochezia. GU:  No urgency, frequency, dysuria, or hematuria. Musculoskeletal:  No back pain.  No joint pain.  No muscle tenderness. Extremities: No pain or swelling. Skin:  No rashes or skin changes. Neuro:  No headache, numbness or weakness, balance or coordination issues. Endocrine:  No  diabetes.  Thyroid disease on Synthroid.  No hot flashes or night sweats. Psych:  Poor sleep.  No mood changes, depression or anxiety. Pain:  No focal pain. Review of systems:  All other systems reviewed and found to be negative.  Physical Exam: Blood pressure 134/60, pulse 57, temperature 97.7 F (36.5 C), temperature source Oral, resp. rate 18, weight 133 lb 2.5 oz (60.4 kg), SpO2 98 %. GENERAL:  Thin elderly woman sitting comfortably in the exam room in no acute distress. MENTAL STATUS:  Alert and oriented to person, place and time. HEAD:  Graying hair pulled back.  Normocephalic, atraumatic, face symmetric, no Cushingoid features. EYES:  Glasses.  Brown eyes.  Pupils equal round and reactive to light and accomodation.  No conjunctivitis or scleral icterus. ENT:  Oropharynx clear without lesion.  Dentures.  Tongue normal. Mucous membranes moist.  RESPIRATORY:  Clear to auscultation without rales, wheezes or rhonchi. CARDIOVASCULAR:  Regular rate and rhythm without murmur, rub or gallop. CHEST WALL;  Port-a-cath. BREASTS:  Right sided mastectomy well healed.  No skin changes, erythema or nodularity.  Left breast without masses, skin changes or nipple discharge. ABDOMEN:  Soft, non-tender, with active bowel sounds, and no hepatosplenomegaly.  No masses. SKIN:  Vitiligo involving face, chest, and hands.  No rashes, ulcers or lesions. EXTREMITIES: No edema, no skin discoloration or tenderness.  No palpable cords. LYMPH NODES: No palpable cervical, supraclavicular, axillary or inguinal adenopathy  NEUROLOGICAL: Unremarkable. PSYCH:  Appropriate.  Appointment on 06/10/2015  Component Date Value Ref Range Status  . WBC 06/10/2015 4.2  3.6 - 11.0 K/uL Final  . RBC 06/10/2015 5.23* 3.80 - 5.20 MIL/uL Final  . Hemoglobin 06/10/2015 13.9  12.0 - 16.0 g/dL Final  . HCT 06/10/2015 42.7  35.0 - 47.0 % Final  . MCV 06/10/2015 81.7  80.0 - 100.0 fL Final  . MCH 06/10/2015 26.5  26.0 - 34.0 pg Final   . MCHC 06/10/2015 32.5  32.0 - 36.0 g/dL Final  . RDW 06/10/2015 16.3* 11.5 - 14.5 % Final  . Platelets 06/10/2015 205  150 - 440 K/uL Final  . Neutrophils Relative % 06/10/2015 64   Final  . Neutro Abs 06/10/2015 2.7  1.4 - 6.5 K/uL Final  . Lymphocytes Relative 06/10/2015 25   Final  . Lymphs Abs 06/10/2015 1.0  1.0 - 3.6 K/uL Final  . Monocytes Relative 06/10/2015 10   Final  . Monocytes Absolute 06/10/2015 0.4  0.2 - 0.9 K/uL Final  . Eosinophils Relative 06/10/2015 0   Final  . Eosinophils Absolute 06/10/2015 0.0  0 - 0.7 K/uL Final  . Basophils Relative 06/10/2015 1   Final  . Basophils Absolute 06/10/2015 0.0  0 - 0.1 K/uL Final  . Sodium 06/10/2015 140  135 - 145 mmol/L Final  . Potassium 06/10/2015 3.1* 3.5 - 5.1 mmol/L Final  . Chloride 06/10/2015 109  101 - 111 mmol/L Final  . CO2 06/10/2015 27  22 - 32  mmol/L Final  . Glucose, Bld 06/10/2015 124* 65 - 99 mg/dL Final  . BUN 06/10/2015 22* 6 - 20 mg/dL Final  . Creatinine, Ser 06/10/2015 0.84  0.44 - 1.00 mg/dL Final  . Calcium 06/10/2015 9.1  8.9 - 10.3 mg/dL Final  . Total Protein 06/10/2015 6.7  6.5 - 8.1 g/dL Final  . Albumin 06/10/2015 3.9  3.5 - 5.0 g/dL Final  . AST 06/10/2015 18  15 - 41 U/L Final  . ALT 06/10/2015 9* 14 - 54 U/L Final  . Alkaline Phosphatase 06/10/2015 55  38 - 126 U/L Final  . Total Bilirubin 06/10/2015 0.4  0.3 - 1.2 mg/dL Final  . GFR calc non Af Amer 06/10/2015 >60  >60 mL/min Final  . GFR calc Af Amer 06/10/2015 >60  >60 mL/min Final   Comment: (NOTE) The eGFR has been calculated using the CKD EPI equation. This calculation has not been validated in all clinical situations. eGFR's persistently <60 mL/min signify possible Chronic Kidney Disease.   Georgiann Hahn gap 06/10/2015 4* 5 - 15 Final    Assessment:  Alexandra Glass is a 82 y.o. female with a history of stage II right breast cancer and stage IIIB right-sided lung cancer.  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   She underwent a  right modified radical mastectomy and node dissection on 02/04/2011. Pathology revealed grade II multifocal disease (largest lesion 3.6 cm).  There was angiolymphatic invasion. Nine lymph nodes were negative. She received radiation to the chest wall and lymphatics from 04/25/2011 until 06/15/2011.  She is on Femara.  CA27.29 was 28.9 on 11/12/2014 and 13.7 on 06/10/2015.  Mammogram on 06/26/2013 was negative.  Bone density study on 03/19/2014 revealed osteopenia with a T score of -1.9 in L1-L4 and -1.6 in the right femur.  She was diagnosed with stage IIIB right lung cancer in 2014.  Pathology revealed differentiated adenocarcinoma consistent with lung primary. EGFR and ALK were negative. She received 6 weeks of concurrent carboplatin and Taxol from 08/08/2012 until 10/08/2012.  CEA was 2.5 on 11/12/2014 and 2.1 on 06/10/2015.  Chest, abdomen, and pelvic CT scan on 01/27/2014 revealed stable RUL pulmonary nodule, 12 x 7 mm ground glass attenuation of the LUL nodule, and multiple bilateral aggressive appearing renal lesions.  The largest was 4.5 x 3.7 cm in the left kidney.  Lesions were highly suspicious for renal cell carcinoma.  Given her age and bilateral disease, a decision by tumor board was for ongoing surveillance.  Chest, abdomen, and pelvic CT scan 12/05/2014 revealed mild progression of the 2.7 cm spiculated medial right upper lobe pulmonary nodule. There was a stable 1.1 cm subsolid left upper lobe pulmonary nodule. There was mild progression of subcarinal mediastinal adenopathy. There were 4 heterogeneous enhancing solid renal masses (1 on the right and 3 on the left kidney) which were slightly enlarged and all suspicious for renal cell carcinoma. There was mild progression of left periaortic adenopathy. She had a 2.6 cm infrarenal abdominal aortic aneurysm.  Chest, abdomen, and pelvic CT scan on 06/09/2015 revealed a mild interval decrease size (2.4 x 1.7 cm) of medial right upper lobe  pulmonary nodule.   There has been interval stability (1.1 x 0.8 cm) of previously described subsolid left upper lobe pulmonary nodule.   The pure ground-glass apical left upper lobe pulmonary nodule had mildly increased in size (1.4 x 1.2 cm).  There was stable mild subcarinal mediastinal and left para-aortic retroperitoneal lymphadenopathy.  There was stable bilateral indeterminate  renal masses, suspicious for renal cell carcinoma based on prior contrast-enhanced studies.  She remains not interested in biopsy or treatment for the enlarging renal masses.  Symptomatically, she feels good.  She is active.  Exam is stable.  Plan: 1.  Labs today: CBC with diff, CMP, CA27.29, CEA. 2.  Review scans.  Discuss patient's thoughts about biopsy and follow-up.  Patient adamantly states that she "don't want to be cut no more" (biopsy or surgery).  She is not interested in treatment.  She desires ongoing monitoring. 3.  Port flush every 6-8 weeks. 4.  Continue Femara. 5.  Schedule mammogram on 06/27/2015 6.  Anticipate bone density study on 03/19/2016. 7.  Chest, abdomen, and pelvic CT scan on 12/07/2015. 8.  RTC in 6 months for MD assessment, labs (CBC with diff, CMP, CA27.29, CEA), and review of mammogram and CT scans.   Lequita Asal, MD  06/10/2015, 3:47 PM

## 2015-06-10 NOTE — Progress Notes (Signed)
Pt here at [facility] today to see MD and go over her CT of Abd/chest/pelvis. Pt reports her appetite is good. Energy is good, she never sits down. She works in her house and garden. Denies pain anywhere. Has a BM daily.

## 2015-06-11 LAB — CEA: CEA: 2.1 ng/mL (ref 0.0–4.7)

## 2015-06-11 LAB — CANCER ANTIGEN 27.29: CA 27.29: 13.7 U/mL (ref 0.0–38.6)

## 2015-06-14 ENCOUNTER — Encounter: Payer: Self-pay | Admitting: Hematology and Oncology

## 2015-07-03 ENCOUNTER — Inpatient Hospital Stay: Payer: Medicare Other

## 2015-07-06 ENCOUNTER — Other Ambulatory Visit: Payer: Self-pay | Admitting: Hematology and Oncology

## 2015-07-24 DIAGNOSIS — H401132 Primary open-angle glaucoma, bilateral, moderate stage: Secondary | ICD-10-CM | POA: Diagnosis not present

## 2015-08-14 ENCOUNTER — Inpatient Hospital Stay: Payer: Medicare Other | Attending: Hematology and Oncology

## 2015-08-14 VITALS — BP 150/72 | HR 69 | Temp 96.8°F | Resp 20

## 2015-08-14 DIAGNOSIS — Z95828 Presence of other vascular implants and grafts: Secondary | ICD-10-CM

## 2015-08-14 DIAGNOSIS — Z452 Encounter for adjustment and management of vascular access device: Secondary | ICD-10-CM | POA: Diagnosis not present

## 2015-08-14 DIAGNOSIS — Z170 Estrogen receptor positive status [ER+]: Secondary | ICD-10-CM | POA: Insufficient documentation

## 2015-08-14 DIAGNOSIS — C50911 Malignant neoplasm of unspecified site of right female breast: Secondary | ICD-10-CM | POA: Diagnosis not present

## 2015-08-14 MED ORDER — SODIUM CHLORIDE 0.9% FLUSH
10.0000 mL | INTRAVENOUS | Status: DC | PRN
  Administered 2015-08-14: 10 mL via INTRAVENOUS
  Filled 2015-08-14: qty 10

## 2015-08-14 MED ORDER — HEPARIN SOD (PORK) LOCK FLUSH 100 UNIT/ML IV SOLN
500.0000 [IU] | Freq: Once | INTRAVENOUS | Status: AC
  Administered 2015-08-14: 500 [IU] via INTRAVENOUS

## 2015-08-31 DIAGNOSIS — E538 Deficiency of other specified B group vitamins: Secondary | ICD-10-CM | POA: Diagnosis not present

## 2015-08-31 DIAGNOSIS — F0390 Unspecified dementia without behavioral disturbance: Secondary | ICD-10-CM | POA: Diagnosis not present

## 2015-09-04 DIAGNOSIS — H401132 Primary open-angle glaucoma, bilateral, moderate stage: Secondary | ICD-10-CM | POA: Diagnosis not present

## 2015-09-23 ENCOUNTER — Other Ambulatory Visit: Payer: Self-pay

## 2015-09-25 ENCOUNTER — Inpatient Hospital Stay: Payer: Medicare Other | Attending: Hematology and Oncology

## 2015-09-25 DIAGNOSIS — C50911 Malignant neoplasm of unspecified site of right female breast: Secondary | ICD-10-CM | POA: Insufficient documentation

## 2015-09-25 DIAGNOSIS — Z452 Encounter for adjustment and management of vascular access device: Secondary | ICD-10-CM | POA: Insufficient documentation

## 2015-09-25 DIAGNOSIS — Z95828 Presence of other vascular implants and grafts: Secondary | ICD-10-CM

## 2015-09-25 DIAGNOSIS — Z170 Estrogen receptor positive status [ER+]: Secondary | ICD-10-CM | POA: Insufficient documentation

## 2015-09-25 MED ORDER — HEPARIN SOD (PORK) LOCK FLUSH 100 UNIT/ML IV SOLN
500.0000 [IU] | Freq: Once | INTRAVENOUS | Status: AC
  Administered 2015-09-25: 500 [IU] via INTRAVENOUS

## 2015-09-25 MED ORDER — SODIUM CHLORIDE 0.9% FLUSH
10.0000 mL | INTRAVENOUS | Status: DC | PRN
  Administered 2015-09-25: 10 mL via INTRAVENOUS
  Filled 2015-09-25: qty 10

## 2015-09-25 MED ORDER — HEPARIN SOD (PORK) LOCK FLUSH 100 UNIT/ML IV SOLN
INTRAVENOUS | Status: AC
  Filled 2015-09-25: qty 5

## 2015-09-27 ENCOUNTER — Encounter: Payer: Self-pay | Admitting: Emergency Medicine

## 2015-09-27 ENCOUNTER — Inpatient Hospital Stay
Admission: EM | Admit: 2015-09-27 | Discharge: 2015-09-29 | DRG: 069 | Disposition: A | Discharge status: Home / Self Care | Payer: Medicare Other | Attending: Internal Medicine | Admitting: Internal Medicine

## 2015-09-27 ENCOUNTER — Emergency Department: Payer: Medicare Other

## 2015-09-27 ENCOUNTER — Other Ambulatory Visit: Payer: Self-pay

## 2015-09-27 DIAGNOSIS — R200 Anesthesia of skin: Secondary | ICD-10-CM | POA: Diagnosis not present

## 2015-09-27 DIAGNOSIS — Z9049 Acquired absence of other specified parts of digestive tract: Secondary | ICD-10-CM | POA: Diagnosis not present

## 2015-09-27 DIAGNOSIS — R4781 Slurred speech: Secondary | ICD-10-CM | POA: Diagnosis not present

## 2015-09-27 DIAGNOSIS — Z9011 Acquired absence of right breast and nipple: Secondary | ICD-10-CM | POA: Diagnosis not present

## 2015-09-27 DIAGNOSIS — I10 Essential (primary) hypertension: Secondary | ICD-10-CM | POA: Diagnosis not present

## 2015-09-27 DIAGNOSIS — Z8673 Personal history of transient ischemic attack (TIA), and cerebral infarction without residual deficits: Secondary | ICD-10-CM

## 2015-09-27 DIAGNOSIS — Z90710 Acquired absence of both cervix and uterus: Secondary | ICD-10-CM

## 2015-09-27 DIAGNOSIS — R4701 Aphasia: Secondary | ICD-10-CM | POA: Diagnosis not present

## 2015-09-27 DIAGNOSIS — Z79899 Other long term (current) drug therapy: Secondary | ICD-10-CM | POA: Diagnosis not present

## 2015-09-27 DIAGNOSIS — Z809 Family history of malignant neoplasm, unspecified: Secondary | ICD-10-CM | POA: Diagnosis not present

## 2015-09-27 DIAGNOSIS — Z9221 Personal history of antineoplastic chemotherapy: Secondary | ICD-10-CM | POA: Diagnosis not present

## 2015-09-27 DIAGNOSIS — E876 Hypokalemia: Secondary | ICD-10-CM

## 2015-09-27 DIAGNOSIS — F0390 Unspecified dementia without behavioral disturbance: Secondary | ICD-10-CM

## 2015-09-27 DIAGNOSIS — I639 Cerebral infarction, unspecified: Secondary | ICD-10-CM | POA: Diagnosis not present

## 2015-09-27 DIAGNOSIS — E785 Hyperlipidemia, unspecified: Secondary | ICD-10-CM | POA: Diagnosis not present

## 2015-09-27 DIAGNOSIS — Z853 Personal history of malignant neoplasm of breast: Secondary | ICD-10-CM | POA: Diagnosis not present

## 2015-09-27 DIAGNOSIS — Z85118 Personal history of other malignant neoplasm of bronchus and lung: Secondary | ICD-10-CM

## 2015-09-27 DIAGNOSIS — Z79811 Long term (current) use of aromatase inhibitors: Secondary | ICD-10-CM

## 2015-09-27 DIAGNOSIS — R202 Paresthesia of skin: Secondary | ICD-10-CM | POA: Diagnosis not present

## 2015-09-27 DIAGNOSIS — Z923 Personal history of irradiation: Secondary | ICD-10-CM | POA: Diagnosis not present

## 2015-09-27 DIAGNOSIS — I6523 Occlusion and stenosis of bilateral carotid arteries: Secondary | ICD-10-CM | POA: Diagnosis not present

## 2015-09-27 DIAGNOSIS — G459 Transient cerebral ischemic attack, unspecified: Principal | ICD-10-CM

## 2015-09-27 LAB — URINALYSIS COMPLETE WITH MICROSCOPIC (ARMC ONLY)
BACTERIA UA: NONE SEEN
BILIRUBIN URINE: NEGATIVE
Glucose, UA: NEGATIVE mg/dL
HGB URINE DIPSTICK: NEGATIVE
NITRITE: NEGATIVE
PH: 5 (ref 5.0–8.0)
PROTEIN: NEGATIVE mg/dL
Specific Gravity, Urine: 1.014 (ref 1.005–1.030)

## 2015-09-27 LAB — COMPREHENSIVE METABOLIC PANEL
ALBUMIN: 4 g/dL (ref 3.5–5.0)
ALT: 9 U/L — ABNORMAL LOW (ref 14–54)
ANION GAP: 7 (ref 5–15)
AST: 16 U/L (ref 15–41)
Alkaline Phosphatase: 59 U/L (ref 38–126)
BILIRUBIN TOTAL: 0.4 mg/dL (ref 0.3–1.2)
BUN: 12 mg/dL (ref 6–20)
CHLORIDE: 107 mmol/L (ref 101–111)
CO2: 25 mmol/L (ref 22–32)
Calcium: 9.6 mg/dL (ref 8.9–10.3)
Creatinine, Ser: 0.89 mg/dL (ref 0.44–1.00)
GFR calc Af Amer: >60 mL/min (ref >60)
GFR calc non Af Amer: 56 mL/min — ABNORMAL LOW (ref >60)
GLUCOSE: 89 mg/dL (ref 65–99)
POTASSIUM: 3.1 mmol/L — AB (ref 3.5–5.1)
SODIUM: 139 mmol/L (ref 135–145)
TOTAL PROTEIN: 6.8 g/dL (ref 6.5–8.1)

## 2015-09-27 LAB — CBC
HEMATOCRIT: 40.5 % (ref 35.0–47.0)
HEMOGLOBIN: 13.4 g/dL (ref 12.0–16.0)
MCH: 26.5 pg (ref 26.0–34.0)
MCHC: 33 g/dL (ref 32.0–36.0)
MCV: 80.3 fL (ref 80.0–100.0)
PLATELETS: 148 10*3/uL — AB (ref 150–440)
RBC: 5.04 MIL/uL (ref 3.80–5.20)
RDW: 16.3 % — AB (ref 11.5–14.5)
WBC: 4.5 10*3/uL (ref 3.6–11.0)

## 2015-09-27 LAB — DIFFERENTIAL
BASOS ABS: 0 10*3/uL (ref 0–0.1)
BASOS PCT: 1 %
EOS ABS: 0 10*3/uL (ref 0–0.7)
EOS PCT: 0 %
LYMPHS ABS: 1 10*3/uL (ref 1.0–3.6)
Lymphocytes Relative: 23 %
Monocytes Absolute: 0.5 10*3/uL (ref 0.2–0.9)
Monocytes Relative: 11 %
NEUTROS PCT: 65 %
Neutro Abs: 3 10*3/uL (ref 1.4–6.5)

## 2015-09-27 LAB — PROTIME-INR
INR: 1.1
Prothrombin Time: 14.4 seconds (ref 11.4–15.0)

## 2015-09-27 LAB — TROPONIN I: Troponin I: <0.03 ng/mL (ref <0.031)

## 2015-09-27 LAB — APTT: APTT: 42 s — AB (ref 24–36)

## 2015-09-27 MED ORDER — ACETAMINOPHEN 650 MG RE SUPP
650.0000 mg | Freq: Four times a day (QID) | RECTAL | Status: DC | PRN
Start: 2015-09-27 — End: 2015-09-29

## 2015-09-27 MED ORDER — DOCUSATE SODIUM 100 MG PO CAPS
100.0000 mg | ORAL_CAPSULE | Freq: Two times a day (BID) | ORAL | Status: DC
  Administered 2015-09-27 – 2015-09-29 (×4): 100 mg via ORAL
  Filled 2015-09-27 (×4): qty 1

## 2015-09-27 MED ORDER — HYDROCHLOROTHIAZIDE 25 MG PO TABS
25.0000 mg | ORAL_TABLET | Freq: Every day | ORAL | Status: DC
  Administered 2015-09-27 – 2015-09-29 (×3): 25 mg via ORAL
  Filled 2015-09-27 (×3): qty 1

## 2015-09-27 MED ORDER — DONEPEZIL HCL 5 MG PO TABS
10.0000 mg | ORAL_TABLET | Freq: Every day | ORAL | Status: DC
  Administered 2015-09-27 – 2015-09-28 (×2): 10 mg via ORAL
  Filled 2015-09-27 (×2): qty 2

## 2015-09-27 MED ORDER — HYDRALAZINE HCL 20 MG/ML IJ SOLN: 10.0000 mg | INTRAMUSCULAR | Status: DC | PRN

## 2015-09-27 MED ORDER — LEVOTHYROXINE SODIUM 88 MCG PO TABS
88.0000 ug | ORAL_TABLET | Freq: Every day | ORAL | Status: DC
  Administered 2015-09-28 – 2015-09-29 (×2): 88 ug via ORAL
  Filled 2015-09-27 (×2): qty 1

## 2015-09-27 MED ORDER — LETROZOLE 2.5 MG PO TABS
2.5000 mg | ORAL_TABLET | Freq: Every day | ORAL | Status: DC
  Administered 2015-09-27 – 2015-09-29 (×3): 2.5 mg via ORAL
  Filled 2015-09-27 (×4): qty 1

## 2015-09-27 MED ORDER — POTASSIUM CHLORIDE IN NACL 20-0.9 MEQ/L-% IV SOLN
INTRAVENOUS | Status: DC
  Administered 2015-09-27: 22:00:00 via INTRAVENOUS
  Filled 2015-09-27 (×3): qty 1000

## 2015-09-27 MED ORDER — PANTOPRAZOLE SODIUM 40 MG PO TBEC
40.0000 mg | DELAYED_RELEASE_TABLET | Freq: Every day | ORAL | Status: DC
  Administered 2015-09-28 – 2015-09-29 (×2): 40 mg via ORAL
  Filled 2015-09-27 (×2): qty 1

## 2015-09-27 MED ORDER — CLOPIDOGREL BISULFATE 75 MG PO TABS
75.0000 mg | ORAL_TABLET | Freq: Every day | ORAL | Status: DC
  Administered 2015-09-28: 75 mg via ORAL
  Filled 2015-09-27: qty 1

## 2015-09-27 MED ORDER — BISACODYL 10 MG RE SUPP: 10.0000 mg | Freq: Every day | RECTAL | Status: DC | PRN

## 2015-09-27 MED ORDER — ONDANSETRON HCL 4 MG/2ML IJ SOLN: 4.0000 mg | Freq: Four times a day (QID) | INTRAMUSCULAR | Status: DC | PRN

## 2015-09-27 MED ORDER — HEPARIN SODIUM (PORCINE) 5000 UNIT/ML IJ SOLN
5000.0000 [IU] | Freq: Three times a day (TID) | INTRAMUSCULAR | Status: DC
  Administered 2015-09-27 – 2015-09-29 (×5): 5000 [IU] via SUBCUTANEOUS
  Filled 2015-09-27 (×5): qty 1

## 2015-09-27 MED ORDER — SODIUM CHLORIDE 0.9% FLUSH
3.0000 mL | Freq: Two times a day (BID) | INTRAVENOUS | Status: DC
  Administered 2015-09-27 – 2015-09-29 (×4): 3 mL via INTRAVENOUS

## 2015-09-27 MED ORDER — ONDANSETRON HCL 4 MG PO TABS: 4.0000 mg | ORAL_TABLET | Freq: Four times a day (QID) | ORAL | Status: DC | PRN

## 2015-09-27 MED ORDER — ACETAMINOPHEN 325 MG PO TABS: 650.0000 mg | ORAL_TABLET | Freq: Four times a day (QID) | ORAL | Status: DC | PRN

## 2015-09-27 MED ORDER — HYDROCODONE-ACETAMINOPHEN 5-325 MG PO TABS: 1.0000 | ORAL_TABLET | ORAL | Status: DC | PRN

## 2015-09-27 MED ORDER — ASPIRIN 81 MG PO CHEW
81.0000 mg | CHEWABLE_TABLET | Freq: Once | ORAL | Status: AC
  Administered 2015-09-27: 81 mg via ORAL
  Filled 2015-09-27: qty 1

## 2015-09-27 MED ORDER — POTASSIUM CHLORIDE CRYS ER 20 MEQ PO TBCR
40.0000 meq | EXTENDED_RELEASE_TABLET | Freq: Once | ORAL | Status: AC
  Administered 2015-09-27: 40 meq via ORAL
  Filled 2015-09-27: qty 2

## 2015-09-27 MED ORDER — VITAMIN B-12 1000 MCG PO TABS
1000.0000 ug | ORAL_TABLET | Freq: Every day | ORAL | Status: DC
  Administered 2015-09-27 – 2015-09-29 (×3): 1000 ug via ORAL
  Filled 2015-09-27 (×3): qty 1

## 2015-09-27 NOTE — ED Provider Notes (Signed)
[facility] Emergency Department Provider Note   ____________________________________________  Time seen: Approximately 5:30 PM I have reviewed the triage vital signs and the triage nursing note.  HISTORY  Chief Complaint Weakness   Historian Patient own history is limited due to some mild dementia Son and daughter-in-law provide additional history.  HPI Alexandra Glass is a 82 y.o. female lives alone but her son checks on her frequently. This patient had weakness of the right arm, decreased grip strength and was unable to hold a cup yesterday around 11 AM and when EMS got there her symptoms had resolved and she was not transported.  This morning when she woke up she was having sensory changes to her right arm and leg, numbness and tingling, and also was having trouble holding a glass again. No prior history of stroke or heart attack. She does not take aspirin or blood thinners.  No recent illnesses such as cough, vomiting, nausea, diarrhea.  She has a history of metastatic breast cancer with a history of mastectomy and she does have a port.  Symptoms seem to have improved, weakness seems to be improved, but persistent paresthesias of the right arm and leg. Family also thought that they noticed some slurred speech although that seems to be improved not to.    Past Medical History  Diagnosis Date  . Thyroid disease   . Hypertension   . Breast cancer [facility])     Patient Active Problem List   Diagnosis Date Noted  . Amnesia 11/27/2014  . Breast cancer, right breast ([place]) 11/12/2014  . Cancer of upper lobe of right lung ([place]) 11/12/2014  . Bilateral kidney masses 11/12/2014  . Hyperlipidemia 10/09/2014    Past Surgical History  Procedure Laterality Date  . Tumor surg      removed from L) side  . Appendectomy    . Breast surgery Right   . Vaginal hysterectomy    . Mastectomy Right 02/04/11    chemo/rad- takes femara  . Breast biopsy Left     neg     Current Outpatient Rx  Name  Route  Sig  Dispense  Refill  . donepezil (ARICEPT) 10 MG tablet   Oral   Take 10 mg by mouth at bedtime. Potter         . hydrochlorothiazide (HYDRODIURIL) 25 MG tablet      TAKE ONE (1) TABLET BY MOUTH ONCE DAILY   30 tablet   8   . letrozole (FEMARA) 2.5 MG tablet      TAKE (1) TABLET BY MOUTH EVERY DAY   30 tablet   3   . levothyroxine (SYNTHROID, LEVOTHROID) 88 MCG tablet   Oral   Take 1 tablet (88 mcg total) by mouth daily.   90 tablet   0     Pt needs to take this instead of 121mg due to lab ...   . vitamin B-12 (CYANOCOBALAMIN) 1000 MCG tablet   Oral   Take 1,000 mcg by mouth daily.           Allergies No known allergies  Family History  Problem Relation Age of Onset  . Cancer Sister     Social History Social History  Substance Use Topics  . Smoking status: Never Smoker   . Smokeless tobacco: None  . Alcohol Use: No    Review of Systems  Constitutional: Negative for fever. Eyes: Negative for visual changes. ENT: Negative for sore throat. Cardiovascular: Negative for chest pain. Respiratory: Negative for  shortness of breath. Gastrointestinal: Negative for abdominal pain, vomiting and diarrhea. Genitourinary: Negative for dysuria. Musculoskeletal: Negative for back pain. Skin: Negative for rash. Neurological: Negative for headache. 10 point Review of Systems otherwise negative ____________________________________________   PHYSICAL EXAM:  VITAL SIGNS: ED Triage Vitals  Enc Vitals Group     BP 09/27/15 1519 152/81 mmHg     Pulse Rate 09/27/15 1519 77     Resp 09/27/15 1519 18     Temp 09/27/15 1519 97.7 F (36.5 C)     Temp Source 09/27/15 1519 Oral     SpO2 09/27/15 1519 98 %     Weight 09/27/15 1519 134 lb (60.782 kg)     Height 09/27/15 1519 '5\' 7"'$  (1.702 m)     Head Cir --      Peak Flow --      Pain Score --      Pain Loc --      Pain Edu? --      Excl. in [place]? --       Constitutional: Alert and Cooperative. Well appearing and in no distress. HEENT   Head: Normocephalic and atraumatic.      Eyes: Conjunctivae are normal. PERRL. Normal extraocular movements.      Ears:         Nose: No congestion/rhinnorhea.   Mouth/Throat: Mucous membranes are moist.   Neck: No stridor. Cardiovascular/Chest: Normal rate, regular rhythm.  No murmurs, rubs, or gallops. Respiratory: Normal respiratory effort without tachypnea nor retractions. Breath sounds are clear and equal bilaterally. No wheezes/rales/rhonchi. Gastrointestinal: Soft. No distention, no guarding, no rebound. Nontender.    Genitourinary/rectal:Deferred Musculoskeletal: Nontender with normal range of motion in all extremities. No joint effusions.  No lower extremity tenderness.  No edema. Neurologic:  No slurred speech. No facial droop. Facial sensation intact and equal bilaterally. Reports paresthesia without true numbness to the right upper extremity and right lower extremity. Finger-nose intact bilaterally. Positive for pronator drift on the right. Patient is able to resist equally bilateral lower extremities for strength testing. Equal and strong grip biceps and triceps of bilateral upper extremities. Skin:  Skin is warm, dry and intact. No rash noted. Vitiligo Psychiatric: Poor Historian.  ____________________________________________   EKG I, Lisa Roca, MD, the attending physician have personally viewed and interpreted all ECGs.  72 bpm. Normal sinus rhythm. Narrow QRS normal axis. Nonspecific ST and T-wave ____________________________________________  LABS (pertinent positives/negatives)  Labs Reviewed  CBC - Abnormal; Notable for the following:    RDW 16.3 (*)    Platelets 148 (*)    All other components within normal limits  COMPREHENSIVE METABOLIC PANEL - Abnormal; Notable for the following:    Potassium 3.1 (*)    ALT 9 (*)    GFR calc non Af Amer 56 (*)    All other  components within normal limits  DIFFERENTIAL  TROPONIN I  PROTIME-INR  APTT  URINALYSIS COMPLETEWITH MICROSCOPIC ([facility] ONLY)  CBG MONITORING, ED    ____________________________________________  RADIOLOGY All Xrays were viewed by me. Imaging interpreted by Radiologist.  CT without contrast: No acute findings. Age-related involutional change. __________________________________________  PROCEDURES  Procedure(s) performed: None  Critical Care performed: None  ____________________________________________   ED COURSE / ASSESSMENT AND PLAN  Pertinent labs & imaging results that were available during my care of the patient were reviewed by me and considered in my medical decision making (see chart for details).   Patient giving a history of symptoms concerning for stroke, although some  waxing and waning of the weakness component of the right upper extremity. Her symptoms were noticed yesterday around 11 AM but then seemed to resolve. Symptoms again this morning. Patient was not a code stroke and is not a TPA candidate due to unknown time of onset, waxing and waning/improving symptoms.  Currently she is still having paresthesias to her right arm and right leg, and also has right-sided pronator drift. Her CT scan is negative, but she needs additional workup for stroke/TIA. I am going to give her a baby aspirin dose here. I discussed with the hospitalist for admission.  Mild hypokalemia was repleted.  Urinalysis pending at time of hospital admission.  CONSULTATIONS:   Hospitalist for admission.   Patient / Family / Caregiver informed of clinical course, medical decision-making process, and agree with plan.    ___________________________________________   FINAL CLINICAL IMPRESSION(S) / ED DIAGNOSES   Final diagnoses:  Transient cerebral ischemia, unspecified transient cerebral ischemia type  Numbness and tingling of right arm and leg              Note:  This dictation was prepared with Dragon dictation. Any transcriptional errors that result from this process are unintentional   Lisa Roca, MD 09/27/15 1751

## 2015-09-27 NOTE — ED Notes (Signed)
Report from [place], [place].

## 2015-09-27 NOTE — ED Notes (Signed)
Pt comes into the ED via POV with her son c/o new onset of weakness in the right arm as well as slurred speech.  According to her son this started yesterday morning around 11:00.  EMS was called to her house yesterday at 3:00pm but stated that they didn't see anything wrong so she was not brought into the hospital.  According to the son he believes she has gotten a little worse today.  Patient seems to have a hard time finding her words here in the triage area.

## 2015-09-27 NOTE — H&P (Signed)
History and Physical    [name] JQZ:[id number] DOB: [birth year] DOA: 09/27/2015  Referring physician: Dr. Reita Cliche PCP: Otilio Miu, MD  Specialists: none  Chief Complaint: right-sided weakness  HPI: Alexandra Glass is a 82 y.o. female has a past medical history significant for breast/lung cancer, HTN, and dementia now with 2 day hx of intermittent slurred speech associated with right-sided weakness. Sx's have been intermittent/recurrent lasting up to an hour at a time. Currently asymptomatic. Head CT OK. BP elevated. Denies HA or visual changes. No difficulty swallowing. She is now admitted.  Review of Systems: The patient denies anorexia, fever, weight loss,, vision loss, decreased hearing, hoarseness, chest pain, syncope, dyspnea on exertion, peripheral edema, balance deficits, hemoptysis, abdominal pain, melena, hematochezia, severe indigestion/heartburn, hematuria, incontinence, genital sores, muscle weakness, suspicious skin lesions, transient blindness, difficulty walking, depression, unusual weight change, abnormal bleeding, enlarged lymph nodes, angioedema, and breast masses.   Past Medical History  Diagnosis Date  . Thyroid disease   . Hypertension   . Breast cancer [facility])    Past Surgical History  Procedure Laterality Date  . Tumor surg      removed from L) side  . Appendectomy    . Breast surgery Right   . Vaginal hysterectomy    . Mastectomy Right 02/04/11    chemo/rad- takes femara  . Breast biopsy Left     neg   Social History:  reports that she has never smoked. She does not have any smokeless tobacco history on file. She reports that she does not drink alcohol or use illicit drugs.  Allergies  Allergen Reactions  . No Known Allergies     Family History  Problem Relation Age of Onset  . Cancer Sister     Prior to Admission medications   Medication Sig Start Date End Date Taking? Authorizing Provider  donepezil (ARICEPT) 10 MG tablet Take 10 mg by mouth at  bedtime. Potter    Historical Provider, MD  hydrochlorothiazide (HYDRODIURIL) 25 MG tablet TAKE ONE (1) TABLET BY MOUTH ONCE DAILY 03/26/15   Juline Patch, MD  letrozole (FEMARA) 2.5 MG tablet TAKE (1) TABLET BY MOUTH EVERY DAY 07/07/15   Lequita Asal, MD  levothyroxine (SYNTHROID, LEVOTHROID) 88 MCG tablet Take 1 tablet (88 mcg total) by mouth daily. 03/27/15   Juline Patch, MD  vitamin B-12 (CYANOCOBALAMIN) 1000 MCG tablet Take 1,000 mcg by mouth daily.    Historical Provider, MD   Physical Exam: Filed Vitals:   09/27/15 1519 09/27/15 1631 09/27/15 1730 09/27/15 1817  BP: 152/81 152/85 146/74   Pulse: 77 67 64   Temp: 97.7 F (36.5 C)   97.7 F (36.5 C)  TempSrc: Oral     Resp: '18 26 17   '$ Height: '5\' 7"'$  (1.702 m)     Weight: 60.782 kg (134 lb)     SpO2: 98% 99% 99%      General:  No apparent distress, WDWN, [place]/AT  Eyes: PERRL, EOMI, no scleral icterus, conjunctiva clear  ENT: moist oropharynx without lesions, TM's benign, dentition fair  Neck: supple, no lymphadenopathy. No bruits or thyromegaly  Cardiovascular: regular rate without MRG; 2+ peripheral pulses, no JVD, no peripheral edema  Respiratory: CTA biL, good air movement without wheezing, rhonchi or crackled  Abdomen: soft, non tender to palpation, positive bowel sounds, no guarding, no rebound  Skin: no rashes or lesions  Musculoskeletal: normal bulk and tone, no joint swelling  Psychiatric: normal mood and affect, A&OX3  Neurologic: CN  2-12 grossly intact, Motor strength 5/5 in all 4 groups with symmetric DTR's and non-focal sensory exam  Labs on Admission:  Basic Metabolic Panel:  Recent Labs Lab 09/27/15 1705  NA 139  K 3.1*  CL 107  CO2 25  GLUCOSE 89  BUN 12  CREATININE 0.89  CALCIUM 9.6   Liver Function Tests:  Recent Labs Lab 09/27/15 1705  AST 16  ALT 9*  ALKPHOS 59  BILITOT 0.4  PROT 6.8  ALBUMIN 4.0   No results for input(s): LIPASE, AMYLASE in the last 168 hours. No  results for input(s): AMMONIA in the last 168 hours. CBC:  Recent Labs Lab 09/27/15 1705  WBC 4.5  NEUTROABS 3.0  HGB 13.4  HCT 40.5  MCV 80.3  PLT 148*   Cardiac Enzymes:  Recent Labs Lab 09/27/15 1705  TROPONINI <0.03    BNP (last 3 results) No results for input(s): BNP in the last 8760 hours.  ProBNP (last 3 results) No results for input(s): PROBNP in the last 8760 hours.  CBG: No results for input(s): GLUCAP in the last 168 hours.  Radiological Exams on Admission: Ct Head Wo Contrast  09/27/2015  CLINICAL DATA:  Right arm weakness and slurred speech since yesterday EXAM: CT HEAD WITHOUT CONTRAST TECHNIQUE: Contiguous axial images were obtained from the base of the skull through the vertex without intravenous contrast. COMPARISON:  07/18/12 mri FINDINGS: Chronic age-related atrophy and white matter involution. No evidence of mass infarct hemorrhage or extra-axial fluid. No hydrocephalus. Calvarium intact. IMPRESSION: No acute findings.  Age-related involutional change. Electronically Signed   By: Skipper Cliche M.D.   On: 09/27/2015 16:17    EKG: Independently reviewed.  Assessment/Plan Principal Problem:   TIA (transient ischemic attack) Active Problems:   Hyperlipidemia   Accelerated hypertension   Senile dementia   Will observe on telemetry and begin Plavix. Consult Neurology. Order MRI of brain with echo and carotids. Consult PT and CM. Supplement K+. Optimize BP regimen. Repeat labs in AM  Diet: low sodium Fluids: NS with K+'@75'$  DVT Prophylaxis: SQ Heparin  Code Status: FULL  Family Communication: yes  Disposition Plan: home  Time spent: 85mn

## 2015-09-27 NOTE — Care Management Obs Status (Signed)
[place] NOTIFICATION   Patient Details  Name: Alexandra Glass MRN: [id number] Date of Birth: 06-Sep-1924   Medicare Observation Status Notification Given:  Yes, pt unable to sign with Right hand. No voiced questions.     Delrae Sawyers, RN 09/27/2015, 9:35 PM

## 2015-09-28 ENCOUNTER — Inpatient Hospital Stay
Admit: 2015-09-28 | Discharge: 2015-09-28 | Disposition: A | Discharge status: Home / Self Care | Payer: Medicare Other | Attending: Internal Medicine | Admitting: Internal Medicine

## 2015-09-28 ENCOUNTER — Observation Stay: Payer: Medicare Other

## 2015-09-28 DIAGNOSIS — Z9049 Acquired absence of other specified parts of digestive tract: Secondary | ICD-10-CM | POA: Diagnosis not present

## 2015-09-28 DIAGNOSIS — I6523 Occlusion and stenosis of bilateral carotid arteries: Secondary | ICD-10-CM | POA: Diagnosis not present

## 2015-09-28 DIAGNOSIS — R4781 Slurred speech: Secondary | ICD-10-CM | POA: Diagnosis not present

## 2015-09-28 DIAGNOSIS — Z90710 Acquired absence of both cervix and uterus: Secondary | ICD-10-CM | POA: Diagnosis not present

## 2015-09-28 DIAGNOSIS — I10 Essential (primary) hypertension: Secondary | ICD-10-CM | POA: Diagnosis present

## 2015-09-28 DIAGNOSIS — Z853 Personal history of malignant neoplasm of breast: Secondary | ICD-10-CM | POA: Diagnosis not present

## 2015-09-28 DIAGNOSIS — Z8673 Personal history of transient ischemic attack (TIA), and cerebral infarction without residual deficits: Secondary | ICD-10-CM | POA: Diagnosis not present

## 2015-09-28 DIAGNOSIS — G459 Transient cerebral ischemic attack, unspecified: Secondary | ICD-10-CM | POA: Diagnosis present

## 2015-09-28 DIAGNOSIS — E785 Hyperlipidemia, unspecified: Secondary | ICD-10-CM | POA: Diagnosis present

## 2015-09-28 DIAGNOSIS — Z79811 Long term (current) use of aromatase inhibitors: Secondary | ICD-10-CM | POA: Diagnosis not present

## 2015-09-28 DIAGNOSIS — Z85118 Personal history of other malignant neoplasm of bronchus and lung: Secondary | ICD-10-CM | POA: Diagnosis not present

## 2015-09-28 DIAGNOSIS — Z79899 Other long term (current) drug therapy: Secondary | ICD-10-CM | POA: Diagnosis not present

## 2015-09-28 DIAGNOSIS — Z9011 Acquired absence of right breast and nipple: Secondary | ICD-10-CM | POA: Diagnosis not present

## 2015-09-28 DIAGNOSIS — Z9221 Personal history of antineoplastic chemotherapy: Secondary | ICD-10-CM | POA: Diagnosis not present

## 2015-09-28 DIAGNOSIS — Z809 Family history of malignant neoplasm, unspecified: Secondary | ICD-10-CM | POA: Diagnosis not present

## 2015-09-28 DIAGNOSIS — F0390 Unspecified dementia without behavioral disturbance: Secondary | ICD-10-CM | POA: Diagnosis present

## 2015-09-28 DIAGNOSIS — I639 Cerebral infarction, unspecified: Secondary | ICD-10-CM | POA: Diagnosis not present

## 2015-09-28 DIAGNOSIS — R4701 Aphasia: Secondary | ICD-10-CM | POA: Diagnosis present

## 2015-09-28 DIAGNOSIS — Z923 Personal history of irradiation: Secondary | ICD-10-CM | POA: Diagnosis not present

## 2015-09-28 LAB — CBC
HCT: 39 % (ref 35.0–47.0)
HEMOGLOBIN: 13.3 g/dL (ref 12.0–16.0)
MCH: 27.6 pg (ref 26.0–34.0)
MCHC: 34 g/dL (ref 32.0–36.0)
MCV: 81 fL (ref 80.0–100.0)
PLATELETS: 166 10*3/uL (ref 150–440)
RBC: 4.81 MIL/uL (ref 3.80–5.20)
RDW: 16.2 % — ABNORMAL HIGH (ref 11.5–14.5)
WBC: 3.2 10*3/uL — ABNORMAL LOW (ref 3.6–11.0)

## 2015-09-28 LAB — COMPREHENSIVE METABOLIC PANEL
ALBUMIN: 3.4 g/dL — AB (ref 3.5–5.0)
ALT: 8 U/L — ABNORMAL LOW (ref 14–54)
ANION GAP: 6 (ref 5–15)
AST: 15 U/L (ref 15–41)
Alkaline Phosphatase: 53 U/L (ref 38–126)
BUN: 11 mg/dL (ref 6–20)
CALCIUM: 9.2 mg/dL (ref 8.9–10.3)
CHLORIDE: 108 mmol/L (ref 101–111)
CO2: 25 mmol/L (ref 22–32)
Creatinine, Ser: 0.84 mg/dL (ref 0.44–1.00)
GFR calc Af Amer: >60 mL/min (ref >60)
GFR calc non Af Amer: 60 mL/min — ABNORMAL LOW (ref >60)
Glucose, Bld: 96 mg/dL (ref 65–99)
POTASSIUM: 3.4 mmol/L — AB (ref 3.5–5.1)
SODIUM: 139 mmol/L (ref 135–145)
TOTAL PROTEIN: 6.3 g/dL — AB (ref 6.5–8.1)
Total Bilirubin: 0.8 mg/dL (ref 0.3–1.2)

## 2015-09-28 MED ORDER — MEMANTINE HCL 5 MG PO TABS
5.0000 mg | ORAL_TABLET | Freq: Two times a day (BID) | ORAL | Status: DC
  Administered 2015-09-28 – 2015-09-29 (×3): 5 mg via ORAL
  Filled 2015-09-28 (×3): qty 1

## 2015-09-28 MED ORDER — ASPIRIN 325 MG PO TABS
325.0000 mg | ORAL_TABLET | Freq: Every day | ORAL | Status: DC
  Administered 2015-09-29: 09:00:00 325 mg via ORAL
  Filled 2015-09-28: qty 1

## 2015-09-28 MED ORDER — LEVOTHYROXINE SODIUM 100 MCG PO TABS
100.0000 ug | ORAL_TABLET | Freq: Every day | ORAL | Status: DC
  Administered 2015-09-28 – 2015-09-29 (×2): 100 ug via ORAL
  Filled 2015-09-28 (×2): qty 1

## 2015-09-28 NOTE — Care Management (Addendum)
Admitted to [facility] with the diagnosis of TIA. Lives alone. Son is [name] [phone]). Seen Dr Otilio Miu in the last year. No Home Health. No skilled facility. Uses no aids for ambulation, Takes care of all basic activities of daily living herself, doesn't drive. Family helps with errands. No falls. Good appetite. Son will transport. Shelbie Ammons RN MSN CCM Care Management [phone]

## 2015-09-28 NOTE — Progress Notes (Signed)
Patient ID: Alexandra Glass, female   DOB: 06/18/1924, 82 y.o.   MRN: [id number] [place] [facility] [place] NAME: Alexandra Glass    MR#:  [id number]  DATE OF BIRTH:  1924-09-23  SUBJECTIVE:    REVIEW OF SYSTEMS:   ROS Tolerating Diet: Tolerating PT:   DRUG ALLERGIES:   Allergies  Allergen Reactions  . No Known Allergies     VITALS:  Blood pressure 160/69, pulse 64, temperature 98.1 F (36.7 C), temperature source Oral, resp. rate 20, height '5\' 7"'$  (1.702 m), weight 64.184 kg (141 lb 8 oz), SpO2 98 %.  PHYSICAL EXAMINATION:   Physical Exam  GENERAL:  82 y.o.-year-old patient lying in the bed with no acute distress.  EYES: Pupils equal, round, reactive to light and accommodation. No scleral icterus. Extraocular muscles intact.  HEENT: Head atraumatic, normocephalic. Oropharynx and nasopharynx clear.  NECK:  Supple, no jugular venous distention. No thyroid enlargement, no tenderness.  LUNGS: Normal breath sounds bilaterally, no wheezing, rales, rhonchi. No use of accessory muscles of respiration.  CARDIOVASCULAR: S1, S2 normal. No murmurs, rubs, or gallops.  ABDOMEN: Soft, nontender, nondistended. Bowel sounds present. No organomegaly or mass.  EXTREMITIES: No cyanosis, clubbing or edema b/l.    NEUROLOGIC: Cranial nerves II through XII are intact. No focal Motor or sensory deficits b/l.   PSYCHIATRIC:  patient is alert and oriented x 3.  SKIN: No obvious rash, lesion, or ulcer.   LABORATORY PANEL:  CBC  Recent Labs Lab 09/28/15 0501  WBC 3.2*  HGB 13.3  HCT 39.0  PLT 166    Chemistries   Recent Labs Lab 09/28/15 0501  NA 139  K 3.4*  CL 108  CO2 25  GLUCOSE 96  BUN 11  CREATININE 0.84  CALCIUM 9.2  AST 15  ALT 8*  ALKPHOS 53  BILITOT 0.8   Cardiac Enzymes  Recent Labs Lab 09/27/15 1705  TROPONINI <0.03   RADIOLOGY:  Ct Head Wo Contrast  09/27/2015  CLINICAL DATA:  Right arm weakness and slurred speech  since yesterday EXAM: CT HEAD WITHOUT CONTRAST TECHNIQUE: Contiguous axial images were obtained from the base of the skull through the vertex without intravenous contrast. COMPARISON:  07/18/12 mri FINDINGS: Chronic age-related atrophy and white matter involution. No evidence of mass infarct hemorrhage or extra-axial fluid. No hydrocephalus. Calvarium intact. IMPRESSION: No acute findings.  Age-related involutional change. Electronically Signed   By: Skipper Cliche M.D.   On: 09/27/2015 16:17   Mr Brain Wo Contrast  09/28/2015  CLINICAL DATA:  Intermittent slurred speech and right-sided weakness for 2 days. History of breast and lung cancer. EXAM: MRI HEAD WITHOUT CONTRAST TECHNIQUE: Multiplanar, multiecho pulse sequences of the brain and surrounding structures were obtained without intravenous contrast. COMPARISON:  Head CT 09/27/2015 and MRI 07/18/2012 FINDINGS: There are patchy areas of acute cortical and subcortical infarction throughout the posterior left MCA territory involving the posterior frontal lobe, parietal lobe, and posterior temporal lobe, overall small to moderate in volume. There is no evidence of associated hemorrhage. A chronic cortical infarct is again seen involving the left parieto-occipital region. There is also a chronic hemorrhagic infarct involving the left cingulate gyrus and adjacent anterior corpus callosum which is new from the prior MRI. Patchy to confluent T2 hyperintensities throughout the cerebral white matter bilaterally elsewhere are similar to the prior MRI and nonspecific but compatible with extensive chronic small vessel ischemic disease. Mild chronic small vessel ischemic changes are again noted  in the pons. Tiny, chronic infarcts are again seen in both cerebellar hemispheres. There is moderate global cerebral atrophy. No mass, midline shift, or extra-axial fluid collection is identified. Prior right cataract extraction is noted. Paranasal sinuses and mastoid air cells are  clear. Major intracranial vascular flow voids are preserved. IMPRESSION: 1. Patchy acute infarcts throughout the posterior left MCA territory. 2. Extensive chronic small vessel ischemic disease. 3. Chronic left parieto-occipital infarct. Electronically Signed   By: Logan Bores M.D.   On: 09/28/2015 11:01   US Carotid Bilateral  09/28/2015  CLINICAL DATA:  TIA.  Hypertension, hyperlipidemia. EXAM: BILATERAL CAROTID DUPLEX ULTRASOUND TECHNIQUE: Pearline Cables scale imaging, color Doppler and duplex ultrasound was performed of bilateral carotid and vertebral arteries in the neck. COMPARISON:  None. TECHNIQUE: Quantification of carotid stenosis is based on velocity parameters that correlate the residual internal carotid diameter with NASCET-based stenosis levels, using the diameter of the distal internal carotid lumen as the denominator for stenosis measurement. The following velocity measurements were obtained: PEAK SYSTOLIC/END DIASTOLIC RIGHT ICA:                     40/12cm/sec CCA:                     37/85YI/FOY SYSTOLIC ICA/CCA RATIO:  0.7 DIASTOLIC ICA/CCA RATIO: 1.2 ECA:                     83cm/sec LEFT ICA:                     36/10cm/sec CCA:                     77/4JO/INO SYSTOLIC ICA/CCA RATIO:  0.6 DIASTOLIC ICA/CCA RATIO: 1.2 ECA:                     85cm/sec FINDINGS: RIGHT CAROTID ARTERY: Eccentric partially calcified somewhat irregular plaque in the distal common carotid artery and bulb extending into the proximal ICA, resulting in at least mild stenosis. Normal waveforms and color Doppler signal. Distal ICA tortuous. RIGHT VERTEBRAL ARTERY:  Normal flow direction and waveform. LEFT CAROTID ARTERY: Eccentric partial calcified plaque in the mid and distal common carotid artery. Mild partially calcified plaque in the bulb and proximal ICA. No high-grade stenosis. Normal waveforms and color Doppler signal. Distal ICA tortuous. LEFT VERTEBRAL ARTERY: Normal flow direction and waveform. IMPRESSION: 1. Bilateral  carotid bifurcation and proximal ICA plaque resulting in less than 50% diameter stenosis. The exam does not exclude plaque ulceration or embolization. Continued surveillance recommended. Electronically Signed   By: Lucrezia Europe M.D.   On: 09/28/2015 10:17   ASSESSMENT AND PLAN:  Gerrie Castiglia is a 82 y.o. female has a past medical history significant for breast/lung cancer, HTN, and dementia now with 2 day hx of intermittent slurred speech associated with right-sided weakness. Sx's have been intermittent/recurrent lasting up to an hour at a time. Currently asymptomatic. Head CT OK. BP elevated. Denies HA or visual changes. No difficulty swallowing  1. Acute left MCA territory CVA with history of stroke in the remote past noted on MRI -Patient came in with expressive aphasia -CT head negative. -Currently she does not have any neurologic symptoms -No focal weakness no facial drool -Continue aspirin and Plavix -Neurology consultation -Ultrasound carotid Dopplers does not show any hemodynamically stenosis -PT OT to see patient -echo pending  2. Hyperlipidemia -Continue statins  3. Accelerated hypertension  Continue hydrocodone thiazide  4.Senile dementia cont Aricept and Namenda  Case discussed with Care Management/Social Worker. Management plans discussed with the patient, family and they are in agreement.  CODE STATUS: full  DVT Prophylaxis: lovenox  TOTAL TIME TAKING CARE OF THIS PATIENT: 30  minutes.  >50% time spent on counselling and coordination of care  POSSIBLE D/C IN 1-2 DAYS, DEPENDING ON CLINICAL CONDITION.  Note: This dictation was prepared with Dragon dictation along with smaller phrase technology. Any transcriptional errors that result from this process are unintentional.  Tanav Orsak M.D on 09/28/2015 at 12:31 PM  Between 7am to 6pm - Pager - [phone]  After 6pm go to [email or web address] - password [email or web address] [facility]  [facility]  Office   [phone]  CC: Primary care physician; Otilio Miu, MD

## 2015-09-28 NOTE — Consult Note (Signed)
Referring Physician: Posey Pronto    Chief Complaint: Difficulty with speech and right upper extremity   HPI: Alexandra Glass is an 82 y.o. female who reports that on Friday night after her guests left she began to notice that she was having trouble with her right side.  She had difficulty taking her bath and getting her self ready for bed.  On Saturday family members noticed her difficulty with her right side and noticed that her speech was not normal either.  EMS was called but she was not brought in because they thought she was doing pretty good.  Patient was then brought in on Sunday due to persistent symptoms.  Patient reports that although her symptoms have fluctuated some they have never completely resolved since Friday.  Initial NIHSS of 0.  Date last known well: 09/25/2015 Time last known well: Unable to determine tPA Given: No: Outside time window  MRankin: 0  Past Medical History  Diagnosis Date  . Thyroid disease   . Hypertension   . Breast cancer [facility])     Past Surgical History  Procedure Laterality Date  . Tumor surg      removed from L) side  . Appendectomy    . Breast surgery Right   . Vaginal hysterectomy    . Mastectomy Right 02/04/11    chemo/rad- takes femara  . Breast biopsy Left     neg    Family History  Problem Relation Age of Onset  . Cancer Sister    Social History:  reports that she has never smoked. She does not have any smokeless tobacco history on file. She reports that she does not drink alcohol or use illicit drugs.  Allergies:  Allergies  Allergen Reactions  . No Known Allergies     Medications:  I have reviewed the patient's current medications. Prior to Admission:  Prescriptions prior to admission  Medication Sig Dispense Refill Last Dose  . donepezil (ARICEPT) 10 MG tablet Take 10 mg by mouth at bedtime. Potter   09/26/2015 at Unknown time  . hydrochlorothiazide (HYDRODIURIL) 25 MG tablet TAKE ONE (1) TABLET BY MOUTH ONCE DAILY 30 tablet 8  09/27/2015 at Unknown time  . letrozole (FEMARA) 2.5 MG tablet TAKE (1) TABLET BY MOUTH EVERY DAY 30 tablet 3 09/27/2015 at Unknown time  . levothyroxine (SYNTHROID, LEVOTHROID) 100 MCG tablet Take 100 mcg by mouth daily before breakfast.   09/27/2015 at Unknown time  . memantine (NAMENDA) 5 MG tablet Take 5 mg by mouth 2 (two) times daily.   09/27/2015 at Unknown time  . vitamin B-12 (CYANOCOBALAMIN) 1000 MCG tablet Take 1,000 mcg by mouth daily.   Taking   Scheduled: . clopidogrel  75 mg Oral Daily  . docusate sodium  100 mg Oral BID  . donepezil  10 mg Oral QHS  . heparin  5,000 Units Subcutaneous Q8H  . hydrochlorothiazide  25 mg Oral Daily  . letrozole  2.5 mg Oral Daily  . levothyroxine  100 mcg Oral QAC breakfast  . levothyroxine  88 mcg Oral QAC breakfast  . memantine  5 mg Oral BID  . pantoprazole  40 mg Oral Daily  . sodium chloride flush  3 mL Intravenous Q12H  . vitamin B-12  1,000 mcg Oral Daily    ROS: History obtained from the patient  General ROS: negative for - chills, fatigue, fever, night sweats, weight gain or weight loss Psychological ROS: negative for - behavioral disorder, hallucinations, memory difficulties, mood swings or suicidal ideation Ophthalmic  ROS: negative for - blurry vision, double vision, eye pain or loss of vision ENT ROS: negative for - epistaxis, nasal discharge, oral lesions, sore throat, tinnitus or vertigo Allergy and Immunology ROS: negative for - hives or itchy/watery eyes Hematological and Lymphatic ROS: negative for - bleeding problems, bruising or swollen lymph nodes Endocrine ROS: negative for - galactorrhea, hair pattern changes, polydipsia/polyuria or temperature intolerance Respiratory ROS: negative for - cough, hemoptysis, shortness of breath or wheezing Cardiovascular ROS: negative for - chest pain, dyspnea on exertion, edema or irregular heartbeat Gastrointestinal ROS: negative for - abdominal pain, diarrhea, hematemesis,  nausea/vomiting or stool incontinence Genito-Urinary ROS: negative for - dysuria, hematuria, incontinence or urinary frequency/urgency Musculoskeletal ROS: negative for - joint swelling or muscular weakness Neurological ROS: as noted in HPI Dermatological ROS: negative for rash and skin lesion changes  Physical Examination: Blood pressure 160/69, pulse 64, temperature 98.1 F (36.7 C), temperature source Oral, resp. rate 20, height '5\' 7"'$  (1.702 m), weight 64.184 kg (141 lb 8 oz), SpO2 98 %.  HEENT-  Normocephalic, no lesions, without obvious abnormality.  Normal external eye and conjunctiva.  Normal TM's bilaterally.  Normal auditory canals and external ears. Normal external nose, mucus membranes and septum.  Normal pharynx. Cardiovascular- S1, S2 normal, pulses palpable throughout   Lungs- chest clear, no wheezing, rales, normal symmetric air entry Abdomen- soft, non-tender; bowel sounds normal; no masses,  no organomegaly Extremities- no edema Lymph-no adenopathy palpable Musculoskeletal-no joint tenderness, deformity or swelling Skin-warm and dry, no hyperpigmentation, vitiligo, or suspicious lesions  Neurological Examination Mental Status: Alert, oriented, thought content appropriate.  Speech aphasic.  Needs reinforcement to follow 3-step commands. Cranial Nerves: II: Discs flat bilaterally; Visual fields grossly normal, pupils equal, round, reactive to light and accommodation III,IV, VI: ptosis not present, extra-ocular motions intact bilaterally V,VII: decrease in right NLF, facial light touch sensation normal bilaterally VIII: hearing normal bilaterally IX,X: gag reflex present XI: bilateral shoulder shrug XII: midline tongue extension Motor: Right : Upper extremity   5-/5    Left:     Upper extremity   5/5  Lower extremity   5/5     Lower extremity   5/5 Tone and bulk:normal tone throughout; no atrophy noted Sensory: Pinprick and light touch intact throughout,  bilaterally Deep Tendon Reflexes: 1+ and symmetric throughout with absent AJ's bilaterally Plantars: Right: downgoing   Left: downgoing Cerebellar: Dysmetria with right upper extremity finger-to-nose testing and normal heel-to-shin testing bilaterally Gait: normal gait and station    Laboratory Studies:  Basic Metabolic Panel:  Recent Labs Lab 09/27/15 1705 09/28/15 0501  NA 139 139  K 3.1* 3.4*  CL 107 108  CO2 25 25  GLUCOSE 89 96  BUN 12 11  CREATININE 0.89 0.84  CALCIUM 9.6 9.2    Liver Function Tests:  Recent Labs Lab 09/27/15 1705 09/28/15 0501  AST 16 15  ALT 9* 8*  ALKPHOS 59 53  BILITOT 0.4 0.8  PROT 6.8 6.3*  ALBUMIN 4.0 3.4*   No results for input(s): LIPASE, AMYLASE in the last 168 hours. No results for input(s): AMMONIA in the last 168 hours.  CBC:  Recent Labs Lab 09/27/15 1705 09/28/15 0501  WBC 4.5 3.2*  NEUTROABS 3.0  --   HGB 13.4 13.3  HCT 40.5 39.0  MCV 80.3 81.0  PLT 148* 166    Cardiac Enzymes:  Recent Labs Lab 09/27/15 1705  TROPONINI <0.03    BNP: Invalid input(s): POCBNP  CBG: No results for input(s):  GLUCAP in the last 168 hours.  Microbiology: No results found for this or any previous visit.  Coagulation Studies:  Recent Labs  09/27/15 1705  LABPROT 14.4  INR 1.10    Urinalysis:  Recent Labs Lab 09/27/15 1811  COLORURINE YELLOW*  LABSPEC 1.014  PHURINE 5.0  GLUCOSEU NEGATIVE  HGBUR NEGATIVE  BILIRUBINUR NEGATIVE  KETONESUR TRACE*  PROTEINUR NEGATIVE  NITRITE NEGATIVE  LEUKOCYTESUR 1+*    Lipid Panel:    Component Value Date/Time   CHOL 264* 10/15/2014 1034   TRIG 114 10/15/2014 1034   HDL 52 10/15/2014 1034   CHOLHDL 5.1* 10/15/2014 1034   LDLCALC 189* 10/15/2014 1034    HgbA1C: No results found for: HGBA1C  Urine Drug Screen:  No results found for: LABOPIA, COCAINSCRNUR, LABBENZ, AMPHETMU, THCU, LABBARB  Alcohol Level: No results for input(s): ETH in the last 168 hours.  Other  results: EKG: sinus rhythm at 72 bpm, incomplete RBB.  Imaging: Ct Head Wo Contrast  09/27/2015  CLINICAL DATA:  Right arm weakness and slurred speech since yesterday EXAM: CT HEAD WITHOUT CONTRAST TECHNIQUE: Contiguous axial images were obtained from the base of the skull through the vertex without intravenous contrast. COMPARISON:  07/18/12 mri FINDINGS: Chronic age-related atrophy and white matter involution. No evidence of mass infarct hemorrhage or extra-axial fluid. No hydrocephalus. Calvarium intact. IMPRESSION: No acute findings.  Age-related involutional change. Electronically Signed   By: Skipper Cliche M.D.   On: 09/27/2015 16:17   Mr Brain Wo Contrast  09/28/2015  CLINICAL DATA:  Intermittent slurred speech and right-sided weakness for 2 days. History of breast and lung cancer. EXAM: MRI HEAD WITHOUT CONTRAST TECHNIQUE: Multiplanar, multiecho pulse sequences of the brain and surrounding structures were obtained without intravenous contrast. COMPARISON:  Head CT 09/27/2015 and MRI 07/18/2012 FINDINGS: There are patchy areas of acute cortical and subcortical infarction throughout the posterior left MCA territory involving the posterior frontal lobe, parietal lobe, and posterior temporal lobe, overall small to moderate in volume. There is no evidence of associated hemorrhage. A chronic cortical infarct is again seen involving the left parieto-occipital region. There is also a chronic hemorrhagic infarct involving the left cingulate gyrus and adjacent anterior corpus callosum which is new from the prior MRI. Patchy to confluent T2 hyperintensities throughout the cerebral white matter bilaterally elsewhere are similar to the prior MRI and nonspecific but compatible with extensive chronic small vessel ischemic disease. Mild chronic small vessel ischemic changes are again noted in the pons. Tiny, chronic infarcts are again seen in both cerebellar hemispheres. There is moderate global cerebral atrophy. No  mass, midline shift, or extra-axial fluid collection is identified. Prior right cataract extraction is noted. Paranasal sinuses and mastoid air cells are clear. Major intracranial vascular flow voids are preserved. IMPRESSION: 1. Patchy acute infarcts throughout the posterior left MCA territory. 2. Extensive chronic small vessel ischemic disease. 3. Chronic left parieto-occipital infarct. Electronically Signed   By: Logan Bores M.D.   On: 09/28/2015 11:01   US Carotid Bilateral  09/28/2015  CLINICAL DATA:  TIA.  Hypertension, hyperlipidemia. EXAM: BILATERAL CAROTID DUPLEX ULTRASOUND TECHNIQUE: Pearline Cables scale imaging, color Doppler and duplex ultrasound was performed of bilateral carotid and vertebral arteries in the neck. COMPARISON:  None. TECHNIQUE: Quantification of carotid stenosis is based on velocity parameters that correlate the residual internal carotid diameter with NASCET-based stenosis levels, using the diameter of the distal internal carotid lumen as the denominator for stenosis measurement. The following velocity measurements were obtained: PEAK SYSTOLIC/END DIASTOLIC RIGHT  ICA:                     40/12cm/sec CCA:                     21/30QM/VHQ SYSTOLIC ICA/CCA RATIO:  0.7 DIASTOLIC ICA/CCA RATIO: 1.2 ECA:                     83cm/sec LEFT ICA:                     36/10cm/sec CCA:                     46/9GE/XBM SYSTOLIC ICA/CCA RATIO:  0.6 DIASTOLIC ICA/CCA RATIO: 1.2 ECA:                     85cm/sec FINDINGS: RIGHT CAROTID ARTERY: Eccentric partially calcified somewhat irregular plaque in the distal common carotid artery and bulb extending into the proximal ICA, resulting in at least mild stenosis. Normal waveforms and color Doppler signal. Distal ICA tortuous. RIGHT VERTEBRAL ARTERY:  Normal flow direction and waveform. LEFT CAROTID ARTERY: Eccentric partial calcified plaque in the mid and distal common carotid artery. Mild partially calcified plaque in the bulb and proximal ICA. No high-grade  stenosis. Normal waveforms and color Doppler signal. Distal ICA tortuous. LEFT VERTEBRAL ARTERY: Normal flow direction and waveform. IMPRESSION: 1. Bilateral carotid bifurcation and proximal ICA plaque resulting in less than 50% diameter stenosis. The exam does not exclude plaque ulceration or embolization. Continued surveillance recommended. Electronically Signed   By: Lucrezia Europe M.D.   On: 09/28/2015 10:17    Assessment: 82 y.o. female presenting with aphasia and right upper extremity weakness, incoordination.  MRI of the brain personally reviewed and shows small acute infarcts in the left MCA territory.  Patient on no antiplatelet therapy at home.  Carotid dopplers show no evidence of hemodynamically significant stenosis.  Echocardiogram is pending.    Stroke Risk Factors - hypertension  Plan: 1. HgbA1c, fasting lipid panel 2. Frequent neuro checks 3. PT consult, OT consult, Speech consult 4. Echocardiogram 5. Prophylactic therapy-Antiplatelet med: Aspirin - dose '325mg'$  daily 6. Telemetry monitoring   Alexis Goodell, MD Neurology [phone] 09/28/2015, 1:07 PM

## 2015-09-28 NOTE — Evaluation (Signed)
Physical Therapy Evaluation Patient Details Name: Alexandra Glass MRN: [id number] DOB: 1925-01-11 Today's Date: 09/28/2015   History of Present Illness  Pt admitted for L CVA. Pt with initial complaints of R side weakness and slurred speech. Pt with history of breast/lung cancer, HTN, and dementia.   Clinical Impression  Pt is a pleasant 82 year old female who was admitted for positive L CVA. Pt performs bed mobility/transfers with independence and ambulation with cga and no AD. Pt demonstrates deficits with balance. Pt with sensation intact and demonstrates slight deficits in coordination using finger->nose test in R UE. Pt demonstrates decreased safety awareness and needs cues for mobility as she is impulsive. Pt reports niece would be able to stay with her for a few days post dc. Would benefit from skilled PT to address above deficits and promote optimal return to PLOF.       Follow Up Recommendations Outpatient PT    Equipment Recommendations       Recommendations for Other Services       Precautions / Restrictions Precautions Precautions: Fall Restrictions Weight Bearing Restrictions: No      Mobility  Bed Mobility Overal bed mobility: Independent             General bed mobility comments: safe technique performed with independence  Transfers Overall transfer level: Independent Equipment used: None Transfers: Sit to/from Stand Sit to Stand: Independent         General transfer comment: safe technique performed, no LOB noted  Ambulation/Gait Ambulation/Gait assistance: Min guard Ambulation Distance (Feet): 200 Feet Assistive device: None Gait Pattern/deviations: Step-through pattern;Staggering left     General Gait Details: ambulated with changing gait speeds, however WNL. Pt with reciprocal gait pattern and often veers off to the L. A few LOB noted during ambulation, however only cga for correction. Pt with decreased safety awareness with LOB and needs cues  from therapist to maintain safety.  Stairs            Wheelchair Mobility    Modified Rankin (Stroke Patients Only)       Balance Overall balance assessment: Needs assistance Sitting-balance support: Feet unsupported Sitting balance-Leahy Scale: Normal     Standing balance support: No upper extremity supported Standing balance-Leahy Scale: Good                               Pertinent Vitals/Pain Pain Assessment: No/denies pain    Home Living Family/patient expects to be discharged to:: Private residence Living Arrangements: Alone Available Help at Discharge: Family (has neice that can stay with her for a few days post dc) Type of Home: House Home Access: Level entry     Home Layout: Two level;Able to live on main level with bedroom/bathroom Home Equipment: None      Prior Function Level of Independence: Independent         Comments: limited community ambulator     Hand Dominance   Dominant Hand: Right    Extremity/Trunk Assessment   Upper Extremity Assessment: Generalized weakness (R UE grossly 4+/5; L UE grossly 5/5)           Lower Extremity Assessment:  (B LE grossly 5/5)         Communication   Communication: No difficulties  Cognition Arousal/Alertness: Awake/alert Behavior During Therapy: WFL for tasks assessed/performed Overall Cognitive Status: Within Functional Limits for tasks assessed  General Comments      Exercises Other Exercises Other Exercises: Pt ambulated to [facility] and is impulsive with decreased safety awareness, needs cues for correct technique as pt almost sat on the handrail. Supervision for hygiene tasks      Assessment/Plan    PT Assessment Patient needs continued PT services  PT Diagnosis Difficulty walking;Abnormality of gait   PT Problem List Decreased strength;Decreased balance;Decreased safety awareness  PT Treatment Interventions Gait training;Balance training    PT Goals (Current goals can be found in the Care Plan section) Acute Rehab PT Goals Patient Stated Goal: to go back home PT Goal Formulation: With patient Time For Goal Achievement: 10/12/15 Potential to Achieve Goals: Good    Frequency 7X/week   Barriers to discharge Decreased caregiver support      Co-evaluation               End of Session Equipment Utilized During Treatment: Gait belt Activity Tolerance: Patient tolerated treatment well Patient left: in bed;with bed alarm set Nurse Communication: Mobility status         Time: 7903-8333 PT Time Calculation (min) (ACUTE ONLY): 30 min   Charges:   PT Evaluation $PT Eval Moderate Complexity: 1 Procedure PT Treatments $Therapeutic Activity: 8-22 mins   PT G Codes:        Edgardo Petrenko Oct 23, 2015, 4:42 PM Greggory Stallion, PT, DPT [phone]

## 2015-09-29 LAB — LIPID PANEL
CHOL/HDL RATIO: 4 ratio
Cholesterol: 273 mg/dL — ABNORMAL HIGH (ref 0–200)
HDL: 68 mg/dL (ref >40)
LDL Cholesterol: 181 mg/dL — ABNORMAL HIGH (ref 0–99)
Triglycerides: 118 mg/dL (ref <150)
VLDL: 24 mg/dL (ref 0–40)

## 2015-09-29 LAB — ECHOCARDIOGRAM COMPLETE
CHL CUP MV DEC (S): 324
E/e' ratio: 10.34
EWDT: 324 ms
FS: 28 % (ref 28–44)
Height: 67 in
IV/PV OW: 1.39
LA diam index: 2.23 cm/m2
LASIZE: 39 mm
LAVOL: 78.2 mL
LAVOLA4C: 53.4 mL
LAVOLIN: 44.7 mL/m2
LEFT ATRIUM END SYS DIAM: 39 mm
LV E/e' medial: 10.34
LV E/e'average: 10.34
LVELAT: 6.74 cm/s
MVPKAVEL: 115 m/s
MVPKEVEL: 69.7 m/s
PW: 10.7 mm — AB (ref 0.6–1.1)
RV TAPSE: 14.6 mm
TDI e' lateral: 6.74
TDI e' medial: 3.48
Weight: 2264 oz

## 2015-09-29 LAB — HEMOGLOBIN A1C: HEMOGLOBIN A1C: 5.8 % (ref 4.0–6.0)

## 2015-09-29 MED ORDER — ATORVASTATIN CALCIUM 20 MG PO TABS: 20.0000 mg | ORAL_TABLET | Freq: Every day | ORAL | Status: DC

## 2015-09-29 MED ORDER — ASPIRIN 325 MG PO TABS: 325.0000 mg | ORAL_TABLET | Freq: Every day | ORAL | Status: DC

## 2015-09-29 MED ORDER — POTASSIUM CHLORIDE CRYS ER 20 MEQ PO TBCR
40.0000 meq | EXTENDED_RELEASE_TABLET | Freq: Once | ORAL | Status: AC
  Administered 2015-09-29: 14:00:00 40 meq via ORAL
  Filled 2015-09-29: qty 2
  Filled 2015-09-29: qty 1

## 2015-09-29 MED ORDER — HEPARIN SOD (PORK) LOCK FLUSH 100 UNIT/ML IV SOLN
100.0000 [IU] | Freq: Once | INTRAVENOUS | Status: AC
  Administered 2015-09-29: 15:00:00 100 [IU]
  Filled 2015-09-29: qty 5

## 2015-09-29 NOTE — Discharge Instructions (Signed)
°  DIET:  Cardiac diet  DISCHARGE CONDITION:  Stable  ACTIVITY:  Activity as tolerated  OXYGEN:  Home Oxygen: No.   Oxygen Delivery: room air  DISCHARGE LOCATION:  home   If you experience worsening of your admission symptoms, develop shortness of breath, life threatening emergency, suicidal or homicidal thoughts you must seek medical attention immediately by calling 911 or calling your MD immediately  if symptoms less severe.  You Must read complete instructions/literature along with all the possible adverse reactions/side effects for all the Medicines you take and that have been prescribed to you. Take any new Medicines after you have completely understood and accpet all the possible adverse reactions/side effects.   Please note  You were cared for by a hospitalist during your hospital stay. If you have any questions about your discharge medications or the care you received while you were in the hospital after you are discharged, you can call the unit and asked to speak with the hospitalist on call if the hospitalist that took care of you is not available. Once you are discharged, your primary care physician will handle any further medical issues. Please note that NO REFILLS for any discharge medications will be authorized once you are discharged, as it is imperative that you return to your primary care physician (or establish a relationship with a primary care physician if you do not have one) for your aftercare needs so that they can reassess your need for medications and monitor your lab values.   OUTPATIENT PHYSICAL THERAPY

## 2015-09-29 NOTE — Progress Notes (Signed)
PATIENT DISCHARGED HOME PER MD ORDER. ALL DISCHARGE INSTRUCTIONS GIVEN AND ALL QUESTIONS ANSWERED.

## 2015-09-29 NOTE — Progress Notes (Signed)
   09/29/15 1100  Clinical Encounter Type  Visited With Patient and family together  Visit Type Follow-up  Referral From Nurse  Consult/Referral To Chaplain  Spiritual Encounters  Spiritual Needs Literature;Other (Comment) (Complete Advance Directive)  Stress Factors  Patient Stress Factors Health changes  Family Stress Factors Major life changes  Advance Directives (For Healthcare)  Does patient have an advance directive? Yes  Would patient like information on creating an advanced directive? Yes - Scientist, clinical (histocompatibility and immunogenetics) given  Type of Paramedic of [place];Living will  Does patient want to make changes to advanced directive? Yes - information given  Copy of advanced directive(s) in chart? Yes (Provided unit admin for pt's chart.)  Completed Advance Directive (HPOA & Living Will). Provided copy to unit admin for chart and original (w/2 copies) were given to patient. Chap. Donavyn Fecher G. [place]

## 2015-09-29 NOTE — Progress Notes (Signed)
Subjective: Patient reports that she feels she is improved but continues with some deficits.    Objective: Current vital signs: BP 141/73 mmHg  Pulse 64  Temp(Src) 97.8 F (36.6 C) (Oral)  Resp 18  Ht '5\' 7"'$  (1.702 m)  Wt 61.372 kg (135 lb 4.8 oz)  BMI 21.19 kg/m2  SpO2 100% Vital signs in last 24 hours: Temp:  [97.6 F (36.4 C)-97.8 F (36.6 C)] 97.8 F (36.6 C) (06/13 1226) Pulse Rate:  [60-70] 64 (06/13 1226) Resp:  [18-20] 18 (06/13 1226) BP: (136-169)/(66-79) 141/73 mmHg (06/13 1226) SpO2:  [96 %-100 %] 100 % (06/13 1226) Weight:  [61.372 kg (135 lb 4.8 oz)] 61.372 kg (135 lb 4.8 oz) (06/13 0500)  Intake/Output from previous day: 06/12 0701 - 06/13 0700 In: 360 [P.O.:360] Out: -  Intake/Output this shift: Total I/O In: 240 [P.O.:240] Out: -  Nutritional status: Diet 2 gram sodium Room service appropriate?: Yes; Fluid consistency:: Thin  Neurologic Exam: Mental Status: Alert, oriented, thought content appropriate. Speech aphasic at times. Needs reinforcement to follow 3-step commands. Cranial Nerves: II: Discs flat bilaterally; Visual fields grossly normal, pupils equal, round, reactive to light and accommodation III,IV, VI: ptosis not present, extra-ocular motions intact bilaterally V,VII: decrease in right NLF, facial light touch sensation normal bilaterally VIII: hearing normal bilaterally IX,X: gag reflex present XI: bilateral shoulder shrug XII: midline tongue extension Motor: Right :Upper extremity 5-/5Left: Upper extremity 5/5 Lower extremity 5/5Lower extremity 5/5  Lab Results: Basic Metabolic Panel:  Recent Labs Lab 09/27/15 1705 09/28/15 0501  NA 139 139  K 3.1* 3.4*  CL 107 108  CO2 25 25  GLUCOSE 89 96  BUN 12 11  CREATININE 0.89 0.84  CALCIUM 9.6 9.2    Liver Function Tests:  Recent Labs Lab 09/27/15 1705  09/28/15 0501  AST 16 15  ALT 9* 8*  ALKPHOS 59 53  BILITOT 0.4 0.8  PROT 6.8 6.3*  ALBUMIN 4.0 3.4*   No results for input(s): LIPASE, AMYLASE in the last 168 hours. No results for input(s): AMMONIA in the last 168 hours.  CBC:  Recent Labs Lab 09/27/15 1705 09/28/15 0501  WBC 4.5 3.2*  NEUTROABS 3.0  --   HGB 13.4 13.3  HCT 40.5 39.0  MCV 80.3 81.0  PLT 148* 166    Cardiac Enzymes:  Recent Labs Lab 09/27/15 1705  TROPONINI <0.03    Lipid Panel: No results for input(s): CHOL, TRIG, HDL, CHOLHDL, VLDL, LDLCALC in the last 168 hours.  CBG: No results for input(s): GLUCAP in the last 168 hours.  Microbiology: No results found for this or any previous visit.  Coagulation Studies:  Recent Labs  09/27/15 1705  LABPROT 14.4  INR 1.10    Imaging: Ct Head Wo Contrast  09/27/2015  CLINICAL DATA:  Right arm weakness and slurred speech since yesterday EXAM: CT HEAD WITHOUT CONTRAST TECHNIQUE: Contiguous axial images were obtained from the base of the skull through the vertex without intravenous contrast. COMPARISON:  07/18/12 mri FINDINGS: Chronic age-related atrophy and white matter involution. No evidence of mass infarct hemorrhage or extra-axial fluid. No hydrocephalus. Calvarium intact. IMPRESSION: No acute findings.  Age-related involutional change. Electronically Signed   By: Skipper Cliche M.D.   On: 09/27/2015 16:17   Mr Brain Wo Contrast  09/28/2015  CLINICAL DATA:  Intermittent slurred speech and right-sided weakness for 2 days. History of breast and lung cancer. EXAM: MRI HEAD WITHOUT CONTRAST TECHNIQUE: Multiplanar, multiecho pulse sequences of the brain and surrounding  structures were obtained without intravenous contrast. COMPARISON:  Head CT 09/27/2015 and MRI 07/18/2012 FINDINGS: There are patchy areas of acute cortical and subcortical infarction throughout the posterior left MCA territory involving the posterior frontal lobe, parietal lobe, and posterior  temporal lobe, overall small to moderate in volume. There is no evidence of associated hemorrhage. A chronic cortical infarct is again seen involving the left parieto-occipital region. There is also a chronic hemorrhagic infarct involving the left cingulate gyrus and adjacent anterior corpus callosum which is new from the prior MRI. Patchy to confluent T2 hyperintensities throughout the cerebral white matter bilaterally elsewhere are similar to the prior MRI and nonspecific but compatible with extensive chronic small vessel ischemic disease. Mild chronic small vessel ischemic changes are again noted in the pons. Tiny, chronic infarcts are again seen in both cerebellar hemispheres. There is moderate global cerebral atrophy. No mass, midline shift, or extra-axial fluid collection is identified. Prior right cataract extraction is noted. Paranasal sinuses and mastoid air cells are clear. Major intracranial vascular flow voids are preserved. IMPRESSION: 1. Patchy acute infarcts throughout the posterior left MCA territory. 2. Extensive chronic small vessel ischemic disease. 3. Chronic left parieto-occipital infarct. Electronically Signed   By: Logan Bores M.D.   On: 09/28/2015 11:01   US Carotid Bilateral  09/28/2015  CLINICAL DATA:  TIA.  Hypertension, hyperlipidemia. EXAM: BILATERAL CAROTID DUPLEX ULTRASOUND TECHNIQUE: Pearline Cables scale imaging, color Doppler and duplex ultrasound was performed of bilateral carotid and vertebral arteries in the neck. COMPARISON:  None. TECHNIQUE: Quantification of carotid stenosis is based on velocity parameters that correlate the residual internal carotid diameter with NASCET-based stenosis levels, using the diameter of the distal internal carotid lumen as the denominator for stenosis measurement. The following velocity measurements were obtained: PEAK SYSTOLIC/END DIASTOLIC RIGHT ICA:                     40/12cm/sec CCA:                     13/24MW/NUU SYSTOLIC ICA/CCA RATIO:  0.7  DIASTOLIC ICA/CCA RATIO: 1.2 ECA:                     83cm/sec LEFT ICA:                     36/10cm/sec CCA:                     72/5DG/UYQ SYSTOLIC ICA/CCA RATIO:  0.6 DIASTOLIC ICA/CCA RATIO: 1.2 ECA:                     85cm/sec FINDINGS: RIGHT CAROTID ARTERY: Eccentric partially calcified somewhat irregular plaque in the distal common carotid artery and bulb extending into the proximal ICA, resulting in at least mild stenosis. Normal waveforms and color Doppler signal. Distal ICA tortuous. RIGHT VERTEBRAL ARTERY:  Normal flow direction and waveform. LEFT CAROTID ARTERY: Eccentric partial calcified plaque in the mid and distal common carotid artery. Mild partially calcified plaque in the bulb and proximal ICA. No high-grade stenosis. Normal waveforms and color Doppler signal. Distal ICA tortuous. LEFT VERTEBRAL ARTERY: Normal flow direction and waveform. IMPRESSION: 1. Bilateral carotid bifurcation and proximal ICA plaque resulting in less than 50% diameter stenosis. The exam does not exclude plaque ulceration or embolization. Continued surveillance recommended. Electronically Signed   By: Lucrezia Europe M.D.   On: 09/28/2015 10:17    Medications:  I have reviewed  the patient's current medications. Scheduled: . aspirin  325 mg Oral Daily  . atorvastatin  20 mg Oral q1800  . docusate sodium  100 mg Oral BID  . donepezil  10 mg Oral QHS  . heparin  5,000 Units Subcutaneous Q8H  . hydrochlorothiazide  25 mg Oral Daily  . letrozole  2.5 mg Oral Daily  . levothyroxine  100 mcg Oral QAC breakfast  . levothyroxine  88 mcg Oral QAC breakfast  . memantine  5 mg Oral BID  . pantoprazole  40 mg Oral Daily  . sodium chloride flush  3 mL Intravenous Q12H  . vitamin B-12  1,000 mcg Oral Daily    Assessment/Plan: Patient stable.  LDL and A1c pending.  On ASA.  Echocardiogram shows no intracardiac source of emboli.  EF 55%.    Recommendations: 1.  Agree with continued ASA.  Patient to follow up with  neurology on an outpatient basis.     LOS: 1 day   Alexis Goodell, MD Neurology [phone] 09/29/2015  12:58 PM

## 2015-09-29 NOTE — Progress Notes (Signed)
Physical Therapy Treatment Patient Details Name: Alexandra Glass MRN: [id number] DOB: July 09, 1924 Today's Date: 09/29/2015    History of Present Illness Pt admitted for L CVA. Pt with initial complaints of R side weakness and slurred speech. Pt with history of breast/lung cancer, HTN, and dementia.     PT Comments    Pt in chair ready for session, stating she is awaiting discharge home.  Pt was able to ambulate around nursing unit x 2 with min guard.  Pt with occasional imbalances which she is able to recover but put her at an increased risk for falls.  Discussed at length the use of assistive devices - rolling walker or cane to assist in mobility and decrease fall risk but she was resistive stating "I won't fall down. Watch"  Pt declined further education on her options or their uses.  Stated she was comfortable with discharge plan.     Follow Up Recommendations  Outpatient PT     Equipment Recommendations       Recommendations for Other Services       Precautions / Restrictions Precautions Precautions: Fall Restrictions Weight Bearing Restrictions: No    Mobility  Bed Mobility                  Transfers Overall transfer level: Independent Equipment used: None Transfers: Sit to/from Stand Sit to Stand: Independent         General transfer comment: safe technique performed, no LOB noted  Ambulation/Gait Ambulation/Gait assistance: Min guard Ambulation Distance (Feet): 350 Feet Assistive device: None Gait Pattern/deviations: Step-through pattern;Staggering left     General Gait Details: ambulated with changing gait speeds, however WNL. Pt with reciprocal gait pattern and often veers off to the L. A few LOB noted during ambulation, however only cga for correction. Pt with decreased safety awareness with LOB and needs cues from therapist to maintain safety.   Stairs            Wheelchair Mobility    Modified Rankin (Stroke Patients Only)        Balance Overall balance assessment: Needs assistance                                  Cognition Arousal/Alertness: Awake/alert Behavior During Therapy: WFL for tasks assessed/performed Overall Cognitive Status: Within Functional Limits for tasks assessed                      Exercises      General Comments        Pertinent Vitals/Pain Pain Assessment: No/denies pain    Home Living                      Prior Function            PT Goals (current goals can now be found in the care plan section) Progress towards PT goals: Progressing toward goals    Frequency  7X/week    PT Plan Current plan remains appropriate    Co-evaluation             End of Session Equipment Utilized During Treatment: Gait belt Activity Tolerance: Patient tolerated treatment well Patient left: in chair;with call bell/phone within reach;with chair alarm set     Time: 3570-1779 PT Time Calculation (min) (ACUTE ONLY): 9 min  Charges:  $Gait Training: 8-22 mins  G Codes:      Chesley Noon, PTA 09/29/2015, 9:56 AM

## 2015-10-01 NOTE — Discharge Summary (Signed)
[place] [facility] [place] NAME: Alexandra Glass    MR#:  [id number]  DATE OF BIRTH:  [birth year]  DATE OF ADMISSION:  09/27/2015 ADMITTING PHYSICIAN: Idelle Crouch, MD  DATE OF DISCHARGE: 09/29/2015  3:30 PM  PRIMARY CARE PHYSICIAN: Otilio Miu, MD   ADMISSION DIAGNOSIS:  TIA (transient ischemic attack) [G45.9] Hypokalemia [E87.6] Numbness and tingling of right arm and leg [R20.2] Transient cerebral ischemia, unspecified transient cerebral ischemia type [G45.9]  DISCHARGE DIAGNOSIS:  Principal Problem:   TIA (transient ischemic attack) Active Problems:   Hyperlipidemia   Accelerated hypertension   Senile dementia   SECONDARY DIAGNOSIS:   Past Medical History  Diagnosis Date  . Thyroid disease   . Hypertension   . Breast cancer [facility])      ADMITTING HISTORY  Chief Complaint: right-sided weakness  HPI: Alexandra Glass is a 82 y.o. female has a past medical history significant for breast/lung cancer, HTN, and dementia now with 2 day hx of intermittent slurred speech associated with right-sided weakness. Sx's have been intermittent/recurrent lasting up to an hour at a time. Currently asymptomatic. Head CT OK. BP elevated. Denies HA or visual changes. No difficulty swallowing. She is now admitted.  HOSPITAL COURSE:   Alexandra Glass is a 82 y.o. female has a past medical history significant for breast/lung cancer, HTN, and dementia now with 2 day hx of intermittent slurred speech associated with right-sided weakness. Sx's have been intermittent/recurrent lasting up to an hour at a time. Currently asymptomatic. Head CT OK. BP elevated. Denies HA or visual changes. No difficulty swallowing  1. Acute left MCA territory CVA with history of stroke in the remote past noted on MRI -Patient came in with expressive aphasia. Symptoms resolved -CT head negative. -Currently she does not have any neurologic symptoms -No focal weakness no facial  drool -Continue aspirin  -Neurology consultation appreciated -Ultrasound carotid Dopplers showed No significant stenosis -PT as outpatient recommended -echo normal  2. Hyperlipidemia -Continue statins  3. Accelerated hypertension Continue hydrochlorothiazide  4.Senile dementia cont Aricept and Namenda  CONSULTS OBTAINED:  Treatment Team:  Alexis Goodell, MD  DRUG ALLERGIES:   Allergies  Allergen Reactions  . No Known Allergies     DISCHARGE MEDICATIONS:   Discharge Medication List as of 09/29/2015  3:11 PM    START taking these medications   Details  atorvastatin (LIPITOR) 20 MG tablet Take 1 tablet (20 mg total) by mouth daily., Starting 09/29/2015, Until Discontinued, Normal      CONTINUE these medications which have CHANGED   Details  aspirin 325 MG tablet Take 1 tablet (325 mg total) by mouth daily., Starting 09/29/2015, Until Discontinued, Normal      CONTINUE these medications which have NOT CHANGED   Details  donepezil (ARICEPT) 10 MG tablet Take 10 mg by mouth at bedtime. [place], Until Discontinued, Historical Med    hydrochlorothiazide (HYDRODIURIL) 25 MG tablet TAKE ONE (1) TABLET BY MOUTH ONCE DAILY, Normal    letrozole (FEMARA) 2.5 MG tablet TAKE (1) TABLET BY MOUTH EVERY DAY, Normal    levothyroxine (SYNTHROID, LEVOTHROID) 100 MCG tablet Take 100 mcg by mouth daily before breakfast., Until Discontinued, Historical Med    memantine (NAMENDA) 5 MG tablet Take 5 mg by mouth 2 (two) times daily., Starting 08/31/2015, Until Tue 08/30/16, Historical Med    vitamin B-12 (CYANOCOBALAMIN) 1000 MCG tablet Take 1,000 mcg by mouth daily., Until Discontinued, Historical Med        Today  VITAL SIGNS:  Blood pressure 141/73, pulse 64, temperature 97.8 F (36.6 C), temperature source Oral, resp. rate 18, height '5\' 7"'$  (1.702 m), weight 61.372 kg (135 lb 4.8 oz), SpO2 100 %.  I/O:  No intake or output data in the 24 hours ending 10/01/15 1358  PHYSICAL  EXAMINATION:  Physical Exam  GENERAL:  82 y.o.-year-old patient lying in the bed with no acute distress.  LUNGS: Normal breath sounds bilaterally, no wheezing, rales,rhonchi or crepitation. No use of accessory muscles of respiration.  CARDIOVASCULAR: S1, S2 normal. No murmurs, rubs, or gallops.  ABDOMEN: Soft, non-tender, non-distended. Bowel sounds present. No organomegaly or mass.  NEUROLOGIC: Moves all 4 extremities. PSYCHIATRIC: The patient is alert and oriented x 3.  SKIN: No obvious rash, lesion, or ulcer.   DATA REVIEW:   CBC  Recent Labs Lab 09/28/15 0501  WBC 3.2*  HGB 13.3  HCT 39.0  PLT 166    Chemistries   Recent Labs Lab 09/28/15 0501  NA 139  K 3.4*  CL 108  CO2 25  GLUCOSE 96  BUN 11  CREATININE 0.84  CALCIUM 9.2  AST 15  ALT 8*  ALKPHOS 53  BILITOT 0.8    Cardiac Enzymes  Recent Labs Lab 09/27/15 1705  TROPONINI <0.03    Microbiology Results  No results found for this or any previous visit.  RADIOLOGY:  No results found.  Follow up with PCP in 1 week.  Management plans discussed with the patient, family and they are in agreement.  CODE STATUS:  Code Status History    Date Active Date Inactive Code Status Order ID Comments User Context   09/27/2015  8:15 PM 09/29/2015  8:01 PM Full Code [id number]  Idelle Crouch, MD Inpatient    Advance Directive Documentation        Most Recent Value   Type of Advance Directive  Healthcare Power of Attorney, Living will   Pre-existing out of facility DNR order (yellow form or pink MOST form)     "MOST" Form in Place?        TOTAL TIME TAKING CARE OF THIS PATIENT ON DAY OF DISCHARGE: more than 30 minutes.   Hillary Bow R M.D on 10/01/2015 at 1:58 PM  Between 7am to 6pm - Pager - [phone]  After 6pm go to [email or web address] - password [email or web address] [facility]  Office  [phone]  CC: Primary care physician; Otilio Miu, MD  Note: This dictation was prepared with  Dragon dictation along with smaller phrase technology. Any transcriptional errors that result from this process are unintentional.

## 2015-10-06 ENCOUNTER — Ambulatory Visit (INDEPENDENT_AMBULATORY_CARE_PROVIDER_SITE_OTHER): Payer: Medicare Other | Admitting: Family Medicine

## 2015-10-06 ENCOUNTER — Encounter: Payer: Self-pay | Admitting: Family Medicine

## 2015-10-06 VITALS — BP 128/70 | HR 64 | Ht 67.0 in | Wt 137.0 lb

## 2015-10-06 DIAGNOSIS — F0390 Unspecified dementia without behavioral disturbance: Secondary | ICD-10-CM | POA: Diagnosis not present

## 2015-10-06 DIAGNOSIS — Z09 Encounter for follow-up examination after completed treatment for conditions other than malignant neoplasm: Secondary | ICD-10-CM

## 2015-10-06 DIAGNOSIS — I679 Cerebrovascular disease, unspecified: Secondary | ICD-10-CM | POA: Diagnosis not present

## 2015-10-06 DIAGNOSIS — I10 Essential (primary) hypertension: Secondary | ICD-10-CM | POA: Diagnosis not present

## 2015-10-06 MED ORDER — HYDROCHLOROTHIAZIDE 12.5 MG PO TABS: 12.5000 mg | ORAL_TABLET | Freq: Every day | ORAL | Status: DC

## 2015-10-06 NOTE — Progress Notes (Signed)
Name: Alexandra Glass   MRN: [id number]    DOB: 1924-06-15   Date:10/06/2015       Progress Note  Subjective  Chief Complaint  Chief Complaint  Patient presents with  . Follow-up    had a TIA- stayed in hosp x 2 days. They added 325 Asp and Atorv    Hypertension This is a chronic problem. The current episode started more than 1 year ago. The problem has been gradually improving since onset. The problem is controlled. Pertinent negatives include no anxiety, blurred vision, chest pain, headaches, malaise/fatigue, neck pain, orthopnea, palpitations, peripheral edema, PND, shortness of breath or sweats. There are no associated agents to hypertension. Past treatments include diuretics. The current treatment provides mild improvement. There are no compliance problems.  Hypertensive end-organ damage includes CVA. There is no history of angina, kidney disease, CAD/MI, left ventricular hypertrophy, PVD, renovascular disease or retinopathy. There is no history of chronic renal disease or a hypertension causing med.    No problem-specific assessment & plan notes found for this encounter.   Past Medical History  Diagnosis Date  . Thyroid disease   . Hypertension   . Breast cancer [facility])     Past Surgical History  Procedure Laterality Date  . Tumor surg      removed from L) side  . Appendectomy    . Breast surgery Right   . Vaginal hysterectomy    . Mastectomy Right 02/04/11    chemo/rad- takes femara  . Breast biopsy Left     neg    Family History  Problem Relation Age of Onset  . Cancer Sister     Social History   Social History  . Marital Status: Single    Spouse Name: N/A  . Number of Children: N/A  . Years of Education: N/A   Occupational History  . Not on file.   Social History Main Topics  . Smoking status: Never Smoker   . Smokeless tobacco: Not on file  . Alcohol Use: No  . Drug Use: No  . Sexual Activity: No   Other Topics Concern  . Not on file   Social History  Narrative    Allergies  Allergen Reactions  . No Known Allergies      Review of Systems  Constitutional: Negative for fever, chills, weight loss and malaise/fatigue.  HENT: Negative for ear discharge, ear pain and sore throat.   Eyes: Negative for blurred vision.  Respiratory: Negative for cough, sputum production, shortness of breath and wheezing.   Cardiovascular: Negative for chest pain, palpitations, orthopnea, leg swelling and PND.  Gastrointestinal: Negative for heartburn, nausea, abdominal pain, diarrhea, constipation, blood in stool and melena.  Genitourinary: Negative for dysuria, urgency, frequency and hematuria.  Musculoskeletal: Negative for myalgias, back pain, joint pain and neck pain.  Skin: Negative for rash.  Neurological: Negative for dizziness, tingling, sensory change, focal weakness and headaches.  Endo/Heme/Allergies: Negative for environmental allergies and polydipsia. Does not bruise/bleed easily.  Psychiatric/Behavioral: Negative for depression and suicidal ideas. The patient is not nervous/anxious and does not have insomnia.      Objective  Filed Vitals:   10/06/15 1123  BP: 128/70  Pulse: 64  Height: '5\' 7"'$  (1.702 m)  Weight: 137 lb (62.143 kg)    Physical Exam  Constitutional: She is well-developed, well-nourished, and in no distress. No distress.  HENT:  Head: Normocephalic and atraumatic.  Right Ear: External ear normal.  Left Ear: External ear normal.  Nose: Nose normal.  Mouth/Throat: Oropharynx is clear and moist.  Eyes: Conjunctivae and EOM are normal. Pupils are equal, round, and reactive to light. Right eye exhibits no discharge. Left eye exhibits no discharge.  Neck: Normal range of motion. Neck supple. No JVD present. No thyromegaly present.  Cardiovascular: Normal rate, regular rhythm, normal heart sounds and intact distal pulses.  Exam reveals no gallop and no friction rub.   No murmur heard. Pulmonary/Chest: Effort normal and  breath sounds normal.  Abdominal: Soft. Bowel sounds are normal. She exhibits no mass. There is no tenderness. There is no guarding.  Musculoskeletal: Normal range of motion. She exhibits no edema.  Lymphadenopathy:    She has no cervical adenopathy.  Neurological: She is alert.  Skin: Skin is warm and dry. She is not diaphoretic.  Psychiatric: Mood and affect normal.  Nursing note and vitals reviewed.     Assessment & Plan  Problem List Items Addressed This Visit      Cardiovascular and Mediastinum   Essential hypertension   Relevant Medications   hydrochlorothiazide (HYDRODIURIL) 12.5 MG tablet   Cerebral vascular disease   Relevant Medications   hydrochlorothiazide (HYDRODIURIL) 12.5 MG tablet     Nervous and Auditory   Senile dementia    Other Visit Diagnoses    Hospital discharge follow-up    -  Primary         Dr. Otilio Miu [facility]  10/06/2015

## 2015-11-03 ENCOUNTER — Inpatient Hospital Stay: Payer: Medicare Other | Attending: Hematology and Oncology

## 2015-11-03 ENCOUNTER — Ambulatory Visit (INDEPENDENT_AMBULATORY_CARE_PROVIDER_SITE_OTHER): Payer: Medicare Other | Admitting: Family Medicine

## 2015-11-03 ENCOUNTER — Encounter: Payer: Self-pay | Admitting: Family Medicine

## 2015-11-03 VITALS — BP 142/80 | HR 60 | Temp 97.6°F | Resp 18 | Ht 67.0 in | Wt 135.0 lb

## 2015-11-03 DIAGNOSIS — Z170 Estrogen receptor positive status [ER+]: Secondary | ICD-10-CM | POA: Diagnosis not present

## 2015-11-03 DIAGNOSIS — Z8673 Personal history of transient ischemic attack (TIA), and cerebral infarction without residual deficits: Secondary | ICD-10-CM | POA: Insufficient documentation

## 2015-11-03 DIAGNOSIS — I10 Essential (primary) hypertension: Secondary | ICD-10-CM

## 2015-11-03 DIAGNOSIS — C50911 Malignant neoplasm of unspecified site of right female breast: Secondary | ICD-10-CM | POA: Diagnosis not present

## 2015-11-03 DIAGNOSIS — Z79811 Long term (current) use of aromatase inhibitors: Secondary | ICD-10-CM | POA: Insufficient documentation

## 2015-11-03 DIAGNOSIS — I679 Cerebrovascular disease, unspecified: Secondary | ICD-10-CM

## 2015-11-03 DIAGNOSIS — F0390 Unspecified dementia without behavioral disturbance: Secondary | ICD-10-CM | POA: Diagnosis not present

## 2015-11-03 DIAGNOSIS — Z452 Encounter for adjustment and management of vascular access device: Secondary | ICD-10-CM | POA: Diagnosis not present

## 2015-11-03 DIAGNOSIS — Z95828 Presence of other vascular implants and grafts: Secondary | ICD-10-CM

## 2015-11-03 DIAGNOSIS — E538 Deficiency of other specified B group vitamins: Secondary | ICD-10-CM | POA: Diagnosis not present

## 2015-11-03 MED ORDER — SODIUM CHLORIDE 0.9% FLUSH
10.0000 mL | INTRAVENOUS | Status: DC | PRN
  Administered 2015-11-03: 10 mL via INTRAVENOUS
  Filled 2015-11-03: qty 10

## 2015-11-03 MED ORDER — HEPARIN SOD (PORK) LOCK FLUSH 100 UNIT/ML IV SOLN
500.0000 [IU] | Freq: Once | INTRAVENOUS | Status: AC
  Administered 2015-11-03: 500 [IU] via INTRAVENOUS
  Filled 2015-11-03: qty 5

## 2015-11-03 MED ORDER — HYDROCHLOROTHIAZIDE 12.5 MG PO TABS: 12.5000 mg | ORAL_TABLET | Freq: Every day | ORAL | Status: DC

## 2015-11-03 NOTE — Progress Notes (Signed)
Name: Alexandra Glass   MRN: [id number]    DOB: January 18, 1925   Date:11/03/2015       Progress Note  Subjective  Chief Complaint  Chief Complaint  Patient presents with  . Follow-up    Dementia, HTN, CVD - Caregiver states she has gain most of the strength back in her right side     HPI Comments: Patient presents with follow up for admission for cva/tia.  Neurologic Problem The patient's pertinent negatives include no altered mental status, clumsiness, focal sensory loss, focal weakness, loss of balance, memory loss, near-syncope, slurred speech, syncope, visual change or weakness. Primary symptoms comment: s/p cva. The current episode started 1 to 4 weeks ago. Pertinent negatives include no abdominal pain, back pain, chest pain, dizziness, fever, headaches, nausea, neck pain, palpitations or shortness of breath. Past treatments include aspirin. The treatment provided mild relief.  Hypertension This is a chronic problem. The current episode started more than 1 year ago. The problem has been gradually improving since onset. The problem is controlled. Pertinent negatives include no anxiety, blurred vision, chest pain, headaches, malaise/fatigue, neck pain, orthopnea, palpitations, peripheral edema, PND, shortness of breath or sweats. There are no associated agents to hypertension. Risk factors for coronary artery disease include dyslipidemia.    No problem-specific assessment & plan notes found for this encounter.   Past Medical History  Diagnosis Date  . Thyroid disease   . Hypertension   . Breast cancer [facility])     Past Surgical History  Procedure Laterality Date  . Tumor surg      removed from L) side  . Appendectomy    . Breast surgery Right   . Vaginal hysterectomy    . Mastectomy Right 02/04/11    chemo/rad- takes femara  . Breast biopsy Left     neg    Family History  Problem Relation Age of Onset  . Cancer Sister     Social History   Social History  . Marital Status:  Single    Spouse Name: N/A  . Number of Children: N/A  . Years of Education: N/A   Occupational History  . Not on file.   Social History Main Topics  . Smoking status: Never Smoker   . Smokeless tobacco: Not on file  . Alcohol Use: No  . Drug Use: No  . Sexual Activity: No   Other Topics Concern  . Not on file   Social History Narrative    Allergies  Allergen Reactions  . No Known Allergies      Review of Systems  Constitutional: Negative for fever, chills, weight loss and malaise/fatigue.  HENT: Negative for ear discharge, ear pain and sore throat.   Eyes: Negative for blurred vision.  Respiratory: Negative for cough, sputum production, shortness of breath and wheezing.   Cardiovascular: Negative for chest pain, palpitations, orthopnea, leg swelling, PND and near-syncope.  Gastrointestinal: Negative for heartburn, nausea, abdominal pain, diarrhea, constipation, blood in stool and melena.  Genitourinary: Negative for dysuria, urgency, frequency and hematuria.  Musculoskeletal: Negative for myalgias, back pain, joint pain and neck pain.  Skin: Negative for rash.  Neurological: Negative for dizziness, tingling, sensory change, focal weakness, syncope, weakness, headaches and loss of balance.  Endo/Heme/Allergies: Negative for environmental allergies and polydipsia. Does not bruise/bleed easily.  Psychiatric/Behavioral: Negative for depression, suicidal ideas and memory loss. The patient is not nervous/anxious and does not have insomnia.      Objective  Filed Vitals:   11/03/15 1043  BP: 142/80  Pulse: 60  Temp: 97.6 F (36.4 C)  TempSrc: Oral  Resp: 18  Height: '5\' 7"'$  (1.702 m)  Weight: 135 lb (61.236 kg)  SpO2: 95%    Physical Exam  Constitutional: She is well-developed, well-nourished, and in no distress. No distress.  HENT:  Head: Normocephalic and atraumatic.  Right Ear: External ear normal.  Left Ear: External ear normal.  Nose: Nose normal.   Mouth/Throat: Oropharynx is clear and moist.  Eyes: Conjunctivae and EOM are normal. Pupils are equal, round, and reactive to light. Right eye exhibits no discharge. Left eye exhibits no discharge.  Neck: Normal range of motion. Neck supple. No JVD present. No thyromegaly present.  Cardiovascular: Normal rate, regular rhythm, normal heart sounds and intact distal pulses.  Exam reveals no gallop and no friction rub.   No murmur heard. Pulmonary/Chest: Effort normal and breath sounds normal. She has no wheezes. She has no rales. She exhibits no tenderness.  Abdominal: Soft. Bowel sounds are normal. She exhibits no mass. There is no tenderness. There is no guarding.  Musculoskeletal: Normal range of motion. She exhibits no edema.  Lymphadenopathy:    She has no cervical adenopathy.  Neurological: She is alert. She has normal sensation, normal strength, normal reflexes and intact cranial nerves.  Skin: Skin is warm and dry. She is not diaphoretic.  Psychiatric: Mood and affect normal.  Nursing note and vitals reviewed.     Assessment & Plan  Problem List Items Addressed This Visit      Cardiovascular and Mediastinum   Essential hypertension - Primary   Relevant Medications   hydrochlorothiazide (HYDRODIURIL) 12.5 MG tablet   Cerebral vascular disease   Relevant Medications   hydrochlorothiazide (HYDRODIURIL) 12.5 MG tablet     Nervous and Auditory   Senile dementia        Dr. Haruye Lainez [facility]  11/03/2015

## 2015-11-06 ENCOUNTER — Inpatient Hospital Stay: Payer: Medicare Other

## 2015-11-23 ENCOUNTER — Other Ambulatory Visit: Payer: Self-pay | Admitting: Hematology and Oncology

## 2015-11-23 ENCOUNTER — Ambulatory Visit
Admission: RE | Admit: 2015-11-23 | Discharge: 2015-11-23 | Disposition: A | Discharge status: Home / Self Care | Payer: Medicare Other | Source: Ambulatory Visit | Attending: Hematology and Oncology | Admitting: Hematology and Oncology

## 2015-11-23 DIAGNOSIS — C50911 Malignant neoplasm of unspecified site of right female breast: Secondary | ICD-10-CM

## 2015-11-23 DIAGNOSIS — Z1231 Encounter for screening mammogram for malignant neoplasm of breast: Secondary | ICD-10-CM | POA: Insufficient documentation

## 2015-11-23 HISTORY — DX: Malignant neoplasm of unspecified kidney, except renal pelvis: C64.9

## 2015-11-26 ENCOUNTER — Other Ambulatory Visit: Payer: Self-pay | Admitting: *Deleted

## 2015-11-26 MED ORDER — LETROZOLE 2.5 MG PO TABS: ORAL_TABLET | ORAL | 3 refills | Status: DC

## 2015-12-07 ENCOUNTER — Ambulatory Visit
Admission: RE | Admit: 2015-12-07 | Discharge: 2015-12-07 | Disposition: A | Discharge status: Home / Self Care | Payer: Medicare Other | Source: Ambulatory Visit | Attending: Hematology and Oncology | Admitting: Hematology and Oncology

## 2015-12-07 DIAGNOSIS — R911 Solitary pulmonary nodule: Secondary | ICD-10-CM | POA: Insufficient documentation

## 2015-12-07 DIAGNOSIS — R590 Localized enlarged lymph nodes: Secondary | ICD-10-CM | POA: Diagnosis not present

## 2015-12-07 DIAGNOSIS — I2510 Atherosclerotic heart disease of native coronary artery without angina pectoris: Secondary | ICD-10-CM | POA: Diagnosis not present

## 2015-12-07 DIAGNOSIS — I7122 Aneurysm of the aortic arch, without rupture: Secondary | ICD-10-CM | POA: Insufficient documentation

## 2015-12-07 DIAGNOSIS — I712 Thoracic aortic aneurysm, without rupture: Secondary | ICD-10-CM | POA: Diagnosis not present

## 2015-12-07 DIAGNOSIS — I700 Atherosclerosis of aorta: Secondary | ICD-10-CM | POA: Insufficient documentation

## 2015-12-07 DIAGNOSIS — N2889 Other specified disorders of kidney and ureter: Secondary | ICD-10-CM | POA: Diagnosis not present

## 2015-12-07 DIAGNOSIS — C50911 Malignant neoplasm of unspecified site of right female breast: Secondary | ICD-10-CM | POA: Diagnosis not present

## 2015-12-07 DIAGNOSIS — J439 Emphysema, unspecified: Secondary | ICD-10-CM | POA: Insufficient documentation

## 2015-12-09 ENCOUNTER — Encounter: Payer: Self-pay | Admitting: Hematology and Oncology

## 2015-12-09 ENCOUNTER — Inpatient Hospital Stay: Payer: Medicare Other | Attending: Hematology and Oncology

## 2015-12-09 ENCOUNTER — Inpatient Hospital Stay (HOSPITAL_BASED_OUTPATIENT_CLINIC_OR_DEPARTMENT_OTHER): Payer: Medicare Other | Admitting: Hematology and Oncology

## 2015-12-09 VITALS — BP 135/79 | HR 59 | Temp 97.3°F | Resp 18 | Wt 134.5 lb

## 2015-12-09 DIAGNOSIS — C50911 Malignant neoplasm of unspecified site of right female breast: Secondary | ICD-10-CM | POA: Insufficient documentation

## 2015-12-09 DIAGNOSIS — Z170 Estrogen receptor positive status [ER+]: Secondary | ICD-10-CM | POA: Insufficient documentation

## 2015-12-09 DIAGNOSIS — Z9011 Acquired absence of right breast and nipple: Secondary | ICD-10-CM

## 2015-12-09 DIAGNOSIS — Z79811 Long term (current) use of aromatase inhibitors: Secondary | ICD-10-CM | POA: Diagnosis not present

## 2015-12-09 DIAGNOSIS — C3491 Malignant neoplasm of unspecified part of right bronchus or lung: Secondary | ICD-10-CM

## 2015-12-09 DIAGNOSIS — E876 Hypokalemia: Secondary | ICD-10-CM | POA: Insufficient documentation

## 2015-12-09 DIAGNOSIS — N2889 Other specified disorders of kidney and ureter: Secondary | ICD-10-CM | POA: Insufficient documentation

## 2015-12-09 DIAGNOSIS — I714 Abdominal aortic aneurysm, without rupture: Secondary | ICD-10-CM | POA: Diagnosis not present

## 2015-12-09 DIAGNOSIS — Z923 Personal history of irradiation: Secondary | ICD-10-CM

## 2015-12-09 DIAGNOSIS — M8580 Other specified disorders of bone density and structure, unspecified site: Secondary | ICD-10-CM | POA: Insufficient documentation

## 2015-12-09 DIAGNOSIS — R590 Localized enlarged lymph nodes: Secondary | ICD-10-CM

## 2015-12-09 DIAGNOSIS — E079 Disorder of thyroid, unspecified: Secondary | ICD-10-CM | POA: Insufficient documentation

## 2015-12-09 DIAGNOSIS — I712 Thoracic aortic aneurysm, without rupture: Secondary | ICD-10-CM

## 2015-12-09 DIAGNOSIS — Z9221 Personal history of antineoplastic chemotherapy: Secondary | ICD-10-CM | POA: Diagnosis not present

## 2015-12-09 DIAGNOSIS — C3411 Malignant neoplasm of upper lobe, right bronchus or lung: Secondary | ICD-10-CM

## 2015-12-09 DIAGNOSIS — I7122 Aneurysm of the aortic arch, without rupture: Secondary | ICD-10-CM

## 2015-12-09 DIAGNOSIS — I10 Essential (primary) hypertension: Secondary | ICD-10-CM | POA: Insufficient documentation

## 2015-12-09 LAB — CBC WITH DIFFERENTIAL/PLATELET
Basophils Absolute: 0 10*3/uL (ref 0–0.1)
Basophils Relative: 0 %
Eosinophils Absolute: 0 10*3/uL (ref 0–0.7)
Eosinophils Relative: 0 %
HCT: 42.1 % (ref 35.0–47.0)
Hemoglobin: 13.7 g/dL (ref 12.0–16.0)
Lymphocytes Relative: 22 %
Lymphs Abs: 0.9 10*3/uL — ABNORMAL LOW (ref 1.0–3.6)
MCH: 26.4 pg (ref 26.0–34.0)
MCHC: 32.6 g/dL (ref 32.0–36.0)
MCV: 81.1 fL (ref 80.0–100.0)
Monocytes Absolute: 0.6 10*3/uL (ref 0.2–0.9)
Monocytes Relative: 13 %
Neutro Abs: 2.7 10*3/uL (ref 1.4–6.5)
Neutrophils Relative %: 65 %
Platelets: 166 10*3/uL (ref 150–440)
RBC: 5.19 MIL/uL (ref 3.80–5.20)
RDW: 16 % — ABNORMAL HIGH (ref 11.5–14.5)
WBC: 4.3 10*3/uL (ref 3.6–11.0)

## 2015-12-09 LAB — COMPREHENSIVE METABOLIC PANEL
ALT: 8 U/L — ABNORMAL LOW (ref 14–54)
AST: 16 U/L (ref 15–41)
Albumin: 4.1 g/dL (ref 3.5–5.0)
Alkaline Phosphatase: 58 U/L (ref 38–126)
Anion gap: 6 (ref 5–15)
BUN: 16 mg/dL (ref 6–20)
CO2: 26 mmol/L (ref 22–32)
Calcium: 9.4 mg/dL (ref 8.9–10.3)
Chloride: 110 mmol/L (ref 101–111)
Creatinine, Ser: 1.03 mg/dL — ABNORMAL HIGH (ref 0.44–1.00)
GFR calc Af Amer: 54 mL/min — ABNORMAL LOW (ref >60)
GFR calc non Af Amer: 47 mL/min — ABNORMAL LOW (ref >60)
Glucose, Bld: 110 mg/dL — ABNORMAL HIGH (ref 65–99)
Potassium: 3.1 mmol/L — ABNORMAL LOW (ref 3.5–5.1)
Sodium: 142 mmol/L (ref 135–145)
Total Bilirubin: 0.5 mg/dL (ref 0.3–1.2)
Total Protein: 7.3 g/dL (ref 6.5–8.1)

## 2015-12-09 MED ORDER — POTASSIUM CHLORIDE ER 10 MEQ PO TBCR: 10.0000 meq | EXTENDED_RELEASE_TABLET | Freq: Every day | ORAL | 0 refills | Status: DC

## 2015-12-09 NOTE — Progress Notes (Signed)
[facility] day:  12/09/15  Chief Complaint: [employer] Null is a 82 y.o. female with a history of stage II breast cancer and stage IIIB lung cancer who is seen for review of interval imaging and 6 month assessment.  HPI: The patient was last seen in the medical oncology clinic on 06/10/2015.  At that time, she felt good.  Exam was stable.  CEA and CA27.29 were normal.  Chest, abdomen, and pelvic CT scan on 06/09/2015 revealed a mild interval decrease size (2.4 x 1.7 cm) of medial RUL pulmonary nodule.   There was stability (1.1 x 0.8 cm) of the subsolid LUL pulmonary nodule.   The pure ground-glass apical left upper lobe pulmonary nodule had mildly increased in size (1.4 x 1.2 cm).  There was stable mild subcarinal mediastinal and left para-aortic retroperitoneal lymphadenopathy.  There was stable bilateral indeterminate renal masses.  Decision was made for continued observation.  She was scheduled for mammogram and 6 month CT scans.  Chest, abdomen, and pelvic CT scan on 12/07/2015 revealed several stable masses in the left kidney.  There was some mild left periaortic adenopathy (stable).  There was stable volume loss and paramediastinal density in the right suprahilar region, likely from prior radiation therapy.  There was sub solid nodularity in the left upper lobe (reduced to stable).  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  Recommendation was for semi-annual imaging followup by CTA or MRA and referral to cardiothoracic surgery.  Mammogram on 11/23/2015 revealed no evidence of malignancy.  Symptomatically, she feels pretty good.  She denies any pain.  She denies any respiratory symptoms or abdominal symptoms.  She denies any hematuria.  She remains on letrozole. She denies any breast concerns.   Past Medical History:  Diagnosis Date  . Breast cancer ([place])   . Hypertension   . Renal cancer ([place])   .  Thyroid disease     Past Surgical History:  Procedure Laterality Date  . APPENDECTOMY    . BREAST BIOPSY Left    neg  . BREAST SURGERY Right   . MASTECTOMY Right 02/04/11   chemo/rad- takes femara  . tumor surg     removed from L) side  . VAGINAL HYSTERECTOMY      Family History  Problem Relation Age of Onset  . Cancer Sister   . Breast cancer Neg Hx     Social History:  reports that she has never smoked. She does not have any smokeless tobacco history on file. She reports that she does not drink alcohol or use drugs.  The patient is accompanied by her son, [name],  today.  Allergies:  Allergies  Allergen Reactions  . No Known Allergies     Current Medications: Current Outpatient Prescriptions  Medication Sig Dispense Refill  . aspirin 325 MG tablet Take 1 tablet (325 mg total) by mouth daily. 90 tablet 0  . atorvastatin (LIPITOR) 20 MG tablet Take 1 tablet (20 mg total) by mouth daily. 30 tablet 0  . donepezil (ARICEPT) 10 MG tablet Take 10 mg by mouth at bedtime. Potter    . hydrochlorothiazide (HYDRODIURIL) 12.5 MG tablet Take 1 tablet (12.5 mg total) by mouth daily. 30 tablet 5  . letrozole (FEMARA) 2.5 MG tablet TAKE (1) TABLET BY MOUTH EVERY DAY 30 tablet 3  . memantine (NAMENDA) 5 MG tablet Take 5 mg by mouth 2 (two) times daily.    Marland Kitchen  vitamin B-12 (CYANOCOBALAMIN) 1000 MCG tablet Take 1,000 mcg by mouth daily.    Marland Kitchen levothyroxine (SYNTHROID, LEVOTHROID) 100 MCG tablet Take 100 mcg by mouth daily before breakfast.    . levothyroxine (SYNTHROID, LEVOTHROID) 88 MCG tablet      No current facility-administered medications for this visit.     Review of Systems:  GENERAL:  Feels good.  Active.  No fevers or sweats.  Weight up 1 pound. PERFORMANCE STATUS (ECOG):  2 HEENT: No visual changes, runny nose, sore throat, mouth sores or tenderness. Lungs:  No cough or shortness of breath.  No hemoptysis. Cardiac:  No chest pain, palpitations, orthopnea, or PND. Breasts:  No  concerns.  Tightness across chest and under arm at site of prior surgery (stable). GI:  No nausea, vomiting, diarrhea, constipation, melena or hematochezia. GU:  No urgency, frequency, dysuria, or hematuria. Musculoskeletal:  No back pain.  No joint pain.  No muscle tenderness. Extremities: No pain or swelling. Skin:  No rashes or skin changes. Neuro:  No headache, numbness or weakness, balance or coordination issues. Endocrine:  No diabetes.  Thyroid disease on Synthroid.  No hot flashes or night sweats. Psych:  No mood changes, depression or anxiety. Pain:  No focal pain. Review of systems:  All other systems reviewed and found to be negative.  Physical Exam: Blood pressure 135/79, pulse (!) 59, temperature 97.3 F (36.3 C), temperature source Tympanic, resp. rate 18, weight 134 lb 7.7 oz (61 kg). GENERAL:  Thin elderly woman sitting comfortably in the exam room in no acute distress. MENTAL STATUS:  Alert and oriented to person, place and time. HEAD:  Graying hair pulled back.  Normocephalic, atraumatic, face symmetric, no Cushingoid features. EYES:  Glasses.  Brown eyes.  Pupils equal round and reactive to light and accomodation.  No conjunctivitis or scleral icterus. ENT:  Oropharynx clear without lesion.  Dentures.  Tongue normal. Mucous membranes moist.  RESPIRATORY:  Clear to auscultation without rales, wheezes or rhonchi. CARDIOVASCULAR:  Regular rate and rhythm without murmur, rub or gallop. CHEST WALL;  Port-a-cath. BREASTS:  Right sided mastectomy well healed.  No skin changes, erythema or nodularity.  Left breast without masses, skin changes or nipple discharge. ABDOMEN:  Soft, non-tender, with active bowel sounds, and no hepatosplenomegaly.  No masses. SKIN:  Vitiligo involving face, chest, and hands.  No rashes, ulcers or lesions. EXTREMITIES: No edema, no skin discoloration or tenderness.  No palpable cords. LYMPH NODES: No palpable cervical, supraclavicular, axillary or  inguinal adenopathy  NEUROLOGICAL: Unremarkable. PSYCH:  Appropriate.   Appointment on 12/09/2015  Component Date Value Ref Range Status  . WBC 12/09/2015 4.3  3.6 - 11.0 K/uL Final  . RBC 12/09/2015 5.19  3.80 - 5.20 MIL/uL Final  . Hemoglobin 12/09/2015 13.7  12.0 - 16.0 g/dL Final  . HCT 12/09/2015 42.1  35.0 - 47.0 % Final  . MCV 12/09/2015 81.1  80.0 - 100.0 fL Final  . MCH 12/09/2015 26.4  26.0 - 34.0 pg Final  . MCHC 12/09/2015 32.6  32.0 - 36.0 g/dL Final  . RDW 12/09/2015 16.0* 11.5 - 14.5 % Final  . Platelets 12/09/2015 166  150 - 440 K/uL Final  . Neutrophils Relative % 12/09/2015 65  % Final  . Neutro Abs 12/09/2015 2.7  1.4 - 6.5 K/uL Final  . Lymphocytes Relative 12/09/2015 22  % Final  . Lymphs Abs 12/09/2015 0.9* 1.0 - 3.6 K/uL Final  . Monocytes Relative 12/09/2015 13  % Final  .  Monocytes Absolute 12/09/2015 0.6  0.2 - 0.9 K/uL Final  . Eosinophils Relative 12/09/2015 0  % Final  . Eosinophils Absolute 12/09/2015 0.0  0 - 0.7 K/uL Final  . Basophils Relative 12/09/2015 0  % Final  . Basophils Absolute 12/09/2015 0.0  0 - 0.1 K/uL Final  . Sodium 12/09/2015 142  135 - 145 mmol/L Final  . Potassium 12/09/2015 3.1* 3.5 - 5.1 mmol/L Final  . Chloride 12/09/2015 110  101 - 111 mmol/L Final  . CO2 12/09/2015 26  22 - 32 mmol/L Final  . Glucose, Bld 12/09/2015 110* 65 - 99 mg/dL Final  . BUN 12/09/2015 16  6 - 20 mg/dL Final  . Creatinine, Ser 12/09/2015 1.03* 0.44 - 1.00 mg/dL Final  . Calcium 12/09/2015 9.4  8.9 - 10.3 mg/dL Final  . Total Protein 12/09/2015 7.3  6.5 - 8.1 g/dL Final  . Albumin 12/09/2015 4.1  3.5 - 5.0 g/dL Final  . AST 12/09/2015 16  15 - 41 U/L Final  . ALT 12/09/2015 8* 14 - 54 U/L Final  . Alkaline Phosphatase 12/09/2015 58  38 - 126 U/L Final  . Total Bilirubin 12/09/2015 0.5  0.3 - 1.2 mg/dL Final  . GFR calc non Af Amer 12/09/2015 47* >60 mL/min Final  . GFR calc Af Amer 12/09/2015 54* >60 mL/min Final   Comment: (NOTE) The eGFR has  been calculated using the CKD EPI equation. This calculation has not been validated in all clinical situations. eGFR's persistently <60 mL/min signify possible Chronic Kidney Disease.   Georgiann Hahn gap 12/09/2015 6  5 - 15 Final    Assessment:  Alexandra Glass is a 82 y.o. female with a history of stage II right breast cancer and stage IIIB right-sided lung cancer.  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   She underwent a right modified radical mastectomy and node dissection on 02/04/2011. Pathology revealed grade II multifocal disease (largest lesion 3.6 cm).  There was angiolymphatic invasion. Nine lymph nodes were negative. She received radiation to the chest wall and lymphatics from 04/25/2011 until 06/15/2011.  She is on Femara.  CA27.29 was 28.9 on 11/12/2014 and 13.7 on 06/10/2015.  Mammogram on 06/26/2013 was negative.  Bone density study on 03/19/2014 revealed osteopenia with a T score of -1.9 in L1-L4 and -1.6 in the right femur.  She was diagnosed with stage IIIB right lung cancer in 2014.  Pathology revealed differentiated adenocarcinoma consistent with lung primary. EGFR and ALK were negative. She received 6 weeks of concurrent carboplatin and Taxol from 08/08/2012 until 10/08/2012.  CEA was 2.5 on 11/12/2014 and 2.1 on 06/10/2015.  Chest, abdomen, and pelvic CT scan on 01/27/2014 revealed stable RUL pulmonary nodule, 12 x 7 mm ground glass attenuation of the LUL nodule, and multiple bilateral aggressive appearing renal lesions.  The largest was 4.5 x 3.7 cm in the left kidney.  Lesions were highly suspicious for renal cell carcinoma.  Given her age and bilateral disease, a decision by tumor board was for ongoing surveillance.  Chest, abdomen, and pelvic CT scan 12/05/2014 revealed mild progression of the 2.7 cm spiculated medial right upper lobe pulmonary nodule. There was a stable 1.1 cm subsolid left upper lobe pulmonary nodule. There was mild progression of subcarinal mediastinal  adenopathy. There were 4 heterogeneous enhancing solid renal masses (1 on the right and 3 on the left kidney) which were slightly enlarged and all suspicious for renal cell carcinoma. There was mild progression of left periaortic adenopathy. She  had a 2.6 cm infrarenal abdominal aortic aneurysm.  Chest, abdomen, and pelvic CT scan on 06/09/2015 revealed a mild interval decrease size (2.4 x 1.7 cm) of medial right upper lobe pulmonary nodule.   There has been interval stability (1.1 x 0.8 cm) of previously described subsolid left upper lobe pulmonary nodule.   The pure ground-glass apical left upper lobe pulmonary nodule had mildly increased in size (1.4 x 1.2 cm).  There was stable mild subcarinal mediastinal and left para-aortic retroperitoneal lymphadenopathy.  There was stable bilateral indeterminate renal masses, suspicious for renal cell carcinoma based on prior contrast-enhanced studies.  Chest, abdomen, and pelvic CT scan on 12/07/2015 revealed several stable masses in the left kidney.  There was some mild left periaortic adenopathy (stable).  There was stable volume loss and paramediastinal density in the right suprahilar region.  There was sub solid nodularity in the left upper lobe (reduced to stable).  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   She is not interested in a biopsy or treatment for the enlarging renal masses.  Symptomatically, she feels good.  Exam is stable.  She has hypokalemia (3.1).  Plan: 1.  Labs today: CBC with diff, CMP, CA27.29, CEA. 2.  Review CT scans.  Copy of scans for patient.  Disease is stable.  Patient desires continued surveillance every 6 months.   3.  Discuss distal aortic arch aneurysm.  Discuss referral to cardiothoracic surgery. 4.  Review mammogram.  No evidence of disease. 5.  Schedule bone density study on 03/21/2016. 6.  Referral to cardiothoracic surgery for surveillance:  aortic arch aneurysm.    7.  Port flush every 6-8 weeks. 8.  Continue Femara. 9.  Rx: potassium chloride 10 meq BID x 2 days then 10 meq a day x 2 days. 10.  RTC on 03/23/2016 for MD assess and review of bone density study.   Lequita Asal, MD  12/09/2015, 11:11 AM

## 2015-12-09 NOTE — Progress Notes (Signed)
Patient is here for follow up, no complaints  

## 2015-12-10 LAB — CANCER ANTIGEN 27.29: CA 27.29: 20.9 U/mL (ref 0.0–38.6)

## 2015-12-13 ENCOUNTER — Encounter: Payer: Self-pay | Admitting: Hematology and Oncology

## 2015-12-15 ENCOUNTER — Ambulatory Visit: Payer: Self-pay | Admitting: Cardiothoracic Surgery

## 2015-12-18 ENCOUNTER — Encounter: Payer: Self-pay | Admitting: Cardiothoracic Surgery

## 2015-12-18 ENCOUNTER — Ambulatory Visit (INDEPENDENT_AMBULATORY_CARE_PROVIDER_SITE_OTHER): Payer: Medicare Other | Admitting: Cardiothoracic Surgery

## 2015-12-18 VITALS — BP 179/76 | HR 64 | Temp 97.5°F | Wt 132.0 lb

## 2015-12-18 DIAGNOSIS — I712 Thoracic aortic aneurysm, without rupture, unspecified: Secondary | ICD-10-CM

## 2015-12-18 NOTE — Progress Notes (Signed)
Patient ID: Alexandra Glass, female   DOB: 10-Sep-1924, 82 y.o.   MRN: [id number]  Chief Complaint  Patient presents with  . Other    Lung cancer    Referred By Dr. Mike Gip Reason for Referral Thoracic aortic aneurysm  HPI Location, Quality, Duration, Severity, Timing, Context, Modifying Factors, Associated Signs and Symptoms.  Alexandra Glass is a 82 y.o. female.  She is accompanied today by her son and daughter-in-law. She is an 53 year old woman with a diagnosis of a thoracic aortic aneurysm that was recently identified on a chest CT. She also has a history of breast cancer, renal cancer and lung cancer. She carries a history of dementia and is unable to recall distant events. She does live alone however. There are 2 nieces and a son who live very close to her frequently check on her. However she is able to carry out the activities of daily living without significant problems. I'm asked to see her today to comment on the thoracic aortic aneurysm. There are multiple CT scans made over the last 5 years. In 2012 a CT scan the chest showed the proximal thoracic aortic aneurysm to measure about 39-40 mm and more recently it has measured between 40 and 41 mm showing a marginal increase over the last 5 years. The patient has declined any intervention for her renal cell carcinoma.   Past Medical History:  Diagnosis Date  . Breast cancer ([place])   . Hypertension   . Renal cancer ([place])   . Thyroid disease     Past Surgical History:  Procedure Laterality Date  . APPENDECTOMY    . BREAST BIOPSY Left    neg  . BREAST SURGERY Right   . MASTECTOMY Right 02/04/11   chemo/rad- takes femara  . tumor surg     removed from L) side  . VAGINAL HYSTERECTOMY      Family History  Problem Relation Age of Onset  . Cancer Sister     lung  . Cancer Sister     pancreas  . Cancer Sister     breast  . Breast cancer Neg Hx     Social History Social History  Substance Use Topics  . Smoking status: Former  Smoker    Types: Cigarettes    Quit date: 12/17/1973  . Smokeless tobacco: Never Used  . Alcohol use No    Allergies  Allergen Reactions  . No Known Allergies     Current Outpatient Prescriptions  Medication Sig Dispense Refill  . aspirin 325 MG tablet Take 1 tablet (325 mg total) by mouth daily. 90 tablet 0  . atorvastatin (LIPITOR) 20 MG tablet Take 1 tablet (20 mg total) by mouth daily. 30 tablet 0  . donepezil (ARICEPT) 10 MG tablet Take 10 mg by mouth at bedtime. Potter    . hydrochlorothiazide (HYDRODIURIL) 12.5 MG tablet Take 1 tablet (12.5 mg total) by mouth daily. 30 tablet 5  . letrozole (FEMARA) 2.5 MG tablet TAKE (1) TABLET BY MOUTH EVERY DAY 30 tablet 3  . levothyroxine (SYNTHROID, LEVOTHROID) 100 MCG tablet Take 100 mcg by mouth daily before breakfast.    . memantine (NAMENDA) 5 MG tablet Take 5 mg by mouth 2 (two) times daily.    . potassium chloride (K-DUR) 10 MEQ tablet Take 1 tablet (10 mEq total) by mouth daily. 6 tablet 0  . vitamin B-12 (CYANOCOBALAMIN) 1000 MCG tablet Take 1,000 mcg by mouth daily.     No current facility-administered medications for this visit.  Review of Systems A complete review of systems was asked and was negative except for the following positive findingsNone  Blood pressure (!) 179/76, pulse 64, temperature 97.5 F (36.4 C), temperature source Oral, weight 132 lb (59.9 kg).  Physical Exam CONSTITUTIONAL:  Pleasant, well-developed, well-nourished, and in no acute distress. EYES: Pupils equal and reactive to light, Sclera non-icteric EARS, NOSE, MOUTH AND THROAT:  The oropharynx was clear.  Dentition is good repair.  Oral mucosa pink and moist. LYMPH NODES:  Lymph nodes in the neck and axillae were normal RESPIRATORY:  Lungs were clear.  Normal respiratory effort without pathologic use of accessory muscles of respiration CARDIOVASCULAR: Heart was regular without murmurs.  There were no carotid bruits. GI: The abdomen was soft,  nontender, and nondistended. There were no palpable masses. There was no hepatosplenomegaly. There were normal bowel sounds in all quadrants. GU:  Rectal deferred.   MUSCULOSKELETAL:  Normal muscle strength and tone.  No clubbing or cyanosis.   SKIN:  There were no pathologic skin lesions.  There were no nodules on palpation.  Vitiligo present NEUROLOGIC:  Sensation is normal.  Cranial nerves are grossly intact. PSYCH:  Oriented to person, place and time.  Mood and affect are normal.  Data Reviewed CT scans  I have personally reviewed the patient's imaging, laboratory findings and medical records.    Assessment    I have independently reviewed the patient's CT scans. In 2012 a thoracic aortic aneurysm is identified. This measured approximately 39-40 mm. More recently it has increased in size to 40-41 mm. This growth of 1-2 mm over 5 years suggest a very slow growing process. I explained to the son that usually these weren't not repaired until approximate 5 cm.    Plan    Because she continues her oncology follow-up with Dr. Mike Gip and obtains annual CT scans I suggested that this be a part of her evaluation for thoracic aortic aneurysm. She should continue to receive annual physical exams and CT scans of the chest for both her lung and breast cancer surveillance as well as for thoracic aortic aneurysm. I did not make a return visit for her but would be happy to see her should the need arise.       Nestor Lewandowsky, MD 12/18/2015, 11:03 AM

## 2015-12-30 ENCOUNTER — Inpatient Hospital Stay: Payer: Medicare Other | Attending: Hematology and Oncology

## 2016-02-10 ENCOUNTER — Inpatient Hospital Stay: Payer: Medicare Other | Attending: Hematology and Oncology

## 2016-02-10 DIAGNOSIS — Z452 Encounter for adjustment and management of vascular access device: Secondary | ICD-10-CM | POA: Diagnosis not present

## 2016-02-10 DIAGNOSIS — Z95828 Presence of other vascular implants and grafts: Secondary | ICD-10-CM

## 2016-02-10 DIAGNOSIS — Z170 Estrogen receptor positive status [ER+]: Secondary | ICD-10-CM | POA: Diagnosis not present

## 2016-02-10 DIAGNOSIS — C50911 Malignant neoplasm of unspecified site of right female breast: Secondary | ICD-10-CM | POA: Insufficient documentation

## 2016-02-10 MED ORDER — HEPARIN SOD (PORK) LOCK FLUSH 100 UNIT/ML IV SOLN
500.0000 [IU] | Freq: Once | INTRAVENOUS | Status: AC
  Administered 2016-02-10: 500 [IU] via INTRAVENOUS
  Filled 2016-02-10: qty 5

## 2016-02-10 MED ORDER — SODIUM CHLORIDE 0.9% FLUSH
10.0000 mL | INTRAVENOUS | Status: DC | PRN
  Administered 2016-02-10: 10 mL via INTRAVENOUS
  Filled 2016-02-10: qty 10

## 2016-02-12 DIAGNOSIS — H401132 Primary open-angle glaucoma, bilateral, moderate stage: Secondary | ICD-10-CM | POA: Diagnosis not present

## 2016-03-01 ENCOUNTER — Other Ambulatory Visit: Payer: Self-pay

## 2016-03-21 ENCOUNTER — Other Ambulatory Visit: Payer: Self-pay | Admitting: Hematology and Oncology

## 2016-03-21 ENCOUNTER — Ambulatory Visit
Admission: RE | Admit: 2016-03-21 | Discharge: 2016-03-21 | Disposition: A | Discharge status: Home / Self Care | Payer: Medicare Other | Source: Ambulatory Visit | Attending: Hematology and Oncology | Admitting: Hematology and Oncology

## 2016-03-21 DIAGNOSIS — Z79811 Long term (current) use of aromatase inhibitors: Secondary | ICD-10-CM | POA: Insufficient documentation

## 2016-03-21 DIAGNOSIS — M85851 Other specified disorders of bone density and structure, right thigh: Secondary | ICD-10-CM | POA: Diagnosis not present

## 2016-03-21 DIAGNOSIS — M8589 Other specified disorders of bone density and structure, multiple sites: Secondary | ICD-10-CM | POA: Diagnosis not present

## 2016-03-21 DIAGNOSIS — C50911 Malignant neoplasm of unspecified site of right female breast: Secondary | ICD-10-CM | POA: Diagnosis not present

## 2016-03-21 DIAGNOSIS — M8580 Other specified disorders of bone density and structure, unspecified site: Secondary | ICD-10-CM | POA: Insufficient documentation

## 2016-03-22 ENCOUNTER — Telehealth: Payer: Self-pay | Admitting: *Deleted

## 2016-03-22 NOTE — Telephone Encounter (Signed)
Message left for patient to call us back at [phone] related to her bone density study.

## 2016-03-23 ENCOUNTER — Inpatient Hospital Stay: Payer: Medicare Other

## 2016-03-23 ENCOUNTER — Telehealth: Payer: Self-pay | Admitting: *Deleted

## 2016-03-23 ENCOUNTER — Other Ambulatory Visit: Payer: Self-pay | Admitting: *Deleted

## 2016-03-23 ENCOUNTER — Inpatient Hospital Stay (HOSPITAL_BASED_OUTPATIENT_CLINIC_OR_DEPARTMENT_OTHER): Payer: Medicare Other | Admitting: Hematology and Oncology

## 2016-03-23 ENCOUNTER — Inpatient Hospital Stay: Payer: Medicare Other | Attending: Hematology and Oncology

## 2016-03-23 ENCOUNTER — Other Ambulatory Visit: Payer: Self-pay | Admitting: Hematology and Oncology

## 2016-03-23 VITALS — BP 143/72 | HR 60 | Temp 97.5°F | Resp 18 | Wt 130.5 lb

## 2016-03-23 DIAGNOSIS — Z170 Estrogen receptor positive status [ER+]: Secondary | ICD-10-CM

## 2016-03-23 DIAGNOSIS — Z9011 Acquired absence of right breast and nipple: Secondary | ICD-10-CM | POA: Insufficient documentation

## 2016-03-23 DIAGNOSIS — Z95828 Presence of other vascular implants and grafts: Secondary | ICD-10-CM

## 2016-03-23 DIAGNOSIS — Z853 Personal history of malignant neoplasm of breast: Secondary | ICD-10-CM

## 2016-03-23 DIAGNOSIS — I7122 Aneurysm of the aortic arch, without rupture: Secondary | ICD-10-CM

## 2016-03-23 DIAGNOSIS — R350 Frequency of micturition: Secondary | ICD-10-CM

## 2016-03-23 DIAGNOSIS — E876 Hypokalemia: Secondary | ICD-10-CM | POA: Insufficient documentation

## 2016-03-23 DIAGNOSIS — C3491 Malignant neoplasm of unspecified part of right bronchus or lung: Secondary | ICD-10-CM | POA: Diagnosis not present

## 2016-03-23 DIAGNOSIS — Z79811 Long term (current) use of aromatase inhibitors: Secondary | ICD-10-CM

## 2016-03-23 DIAGNOSIS — C50911 Malignant neoplasm of unspecified site of right female breast: Secondary | ICD-10-CM | POA: Diagnosis not present

## 2016-03-23 DIAGNOSIS — M8580 Other specified disorders of bone density and structure, unspecified site: Secondary | ICD-10-CM | POA: Diagnosis not present

## 2016-03-23 DIAGNOSIS — I712 Thoracic aortic aneurysm, without rupture: Secondary | ICD-10-CM

## 2016-03-23 DIAGNOSIS — I714 Abdominal aortic aneurysm, without rupture: Secondary | ICD-10-CM | POA: Insufficient documentation

## 2016-03-23 DIAGNOSIS — E079 Disorder of thyroid, unspecified: Secondary | ICD-10-CM | POA: Insufficient documentation

## 2016-03-23 DIAGNOSIS — I10 Essential (primary) hypertension: Secondary | ICD-10-CM | POA: Diagnosis not present

## 2016-03-23 DIAGNOSIS — N2889 Other specified disorders of kidney and ureter: Secondary | ICD-10-CM | POA: Diagnosis not present

## 2016-03-23 DIAGNOSIS — Z452 Encounter for adjustment and management of vascular access device: Secondary | ICD-10-CM | POA: Diagnosis not present

## 2016-03-23 DIAGNOSIS — Z923 Personal history of irradiation: Secondary | ICD-10-CM

## 2016-03-23 DIAGNOSIS — Z9221 Personal history of antineoplastic chemotherapy: Secondary | ICD-10-CM | POA: Insufficient documentation

## 2016-03-23 DIAGNOSIS — M8589 Other specified disorders of bone density and structure, multiple sites: Secondary | ICD-10-CM

## 2016-03-23 DIAGNOSIS — C3411 Malignant neoplasm of upper lobe, right bronchus or lung: Secondary | ICD-10-CM

## 2016-03-23 LAB — URINALYSIS, COMPLETE (UACMP) WITH MICROSCOPIC
Bilirubin Urine: NEGATIVE
Glucose, UA: NEGATIVE mg/dL
Hgb urine dipstick: NEGATIVE
Ketones, ur: NEGATIVE mg/dL
Nitrite: NEGATIVE
Protein, ur: NEGATIVE mg/dL
Specific Gravity, Urine: 1.02 (ref 1.005–1.030)
pH: 6.5 (ref 5.0–8.0)

## 2016-03-23 MED ORDER — SODIUM CHLORIDE 0.9% FLUSH
10.0000 mL | INTRAVENOUS | Status: DC | PRN
  Administered 2016-03-23: 10 mL via INTRAVENOUS
  Filled 2016-03-23: qty 10

## 2016-03-23 MED ORDER — HEPARIN SOD (PORK) LOCK FLUSH 100 UNIT/ML IV SOLN
500.0000 [IU] | Freq: Once | INTRAVENOUS | Status: AC
  Administered 2016-03-23: 500 [IU] via INTRAVENOUS

## 2016-03-23 NOTE — Progress Notes (Signed)
Patient has not had any new diagnosis. Offers no complaints today. Tolerating Letrazole well.

## 2016-03-23 NOTE — Telephone Encounter (Signed)
Called patient to discuss possible prolia treatment and need for dental check.  Patient denied having dentist and denied going to dentist for years.  Patient unsure if she can go to the dentist at this time.  Patient to see MD today.

## 2016-03-23 NOTE — Progress Notes (Signed)
[facility] day:  03/23/16  Chief Complaint: Alexandra Glass is a 82 y.o. female with a history of stage II breast cancer and stage IIIB lung cancer who is seen for review of interval imaging and 6 month assessment.  HPI: The patient was last seen in the medical oncology clinic on 12/09/2015.  At that time, she felt pretty good.  Exam was stable.  CA27.29 was normal.  Chest, abdomen, and pelvic CT scan on 12/07/2015 revealed stable disease.  Mammogram on 11/23/2015 revealed no evidence of malignancy.    She was referred to cardiothoracic surgery for surveillance of her aortic aneurysm.  She was seen by Dr. Genevive Bi on 12/18/2015.  Aneurysm was slowly growing.  Repair was not felt needed until approximately 5 cm.  Ongoing surveillance with CT scans was recommended.  Bone density study on 03/21/2016 revealed osteopenia with a T score of -2.2 in the right femoral neck (previously -1.2 on 03/19/2014) and -1.8 in the AP spine L1-L4 (previously -1.9 on 03/19/2014).  Symptomatically, she is doing well on Femara.  She denies any complaints.  She has dementia.  She states that her partial is loose.   Past Medical History:  Diagnosis Date  . Breast cancer ([place])   . Hypertension   . Renal cancer ([place])   . Thyroid disease     Past Surgical History:  Procedure Laterality Date  . APPENDECTOMY    . BREAST BIOPSY Left    neg  . BREAST SURGERY Right   . MASTECTOMY Right 02/04/11   chemo/rad- takes femara  . tumor surg     removed from L) side  . VAGINAL HYSTERECTOMY      Family History  Problem Relation Age of Onset  . Cancer Sister     lung  . Cancer Sister     pancreas  . Cancer Sister     breast  . Breast cancer Neg Hx     Social History:  reports that she quit smoking about 42 years ago. Her smoking use included Cigarettes. She has never used smokeless tobacco. She reports that she does not drink alcohol or use drugs.  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   She lives in [place].  The patient is accompanied by her son, Alexandra Glass, and daughter-in-law, Alexandra Glass, today.  Allergies:  Allergies  Allergen Reactions  . No Known Allergies     Current Medications: Current Outpatient Prescriptions  Medication Sig Dispense Refill  . aspirin 325 MG tablet Take 1 tablet (325 mg total) by mouth daily. 90 tablet 0  . atorvastatin (LIPITOR) 20 MG tablet Take 1 tablet (20 mg total) by mouth daily. 30 tablet 0  . donepezil (ARICEPT) 10 MG tablet Take 10 mg by mouth at bedtime. [place]    . hydrochlorothiazide (HYDRODIURIL) 12.5 MG tablet Take 1 tablet (12.5 mg total) by mouth daily. 30 tablet 5  . letrozole (FEMARA) 2.5 MG tablet TAKE (1) TABLET BY MOUTH EVERY DAY 30 tablet 3  . levothyroxine (SYNTHROID, LEVOTHROID) 100 MCG tablet Take 100 mcg by mouth daily before breakfast.    . memantine (NAMENDA) 5 MG tablet Take 5 mg by mouth 2 (two) times daily.    . potassium chloride (K-DUR) 10 MEQ tablet Take 1 tablet (10 mEq total) by mouth daily. 6 tablet 0  . vitamin B-12 (CYANOCOBALAMIN) 1000 MCG tablet Take 1,000 mcg by mouth daily.    Marland Kitchen latanoprost (XALATAN) 0.005 % ophthalmic solution      No current  facility-administered medications for this visit.     Review of Systems:  GENERAL:  Feels "good".  Active.  No fevers or sweats.  Weight down 4 pounds. PERFORMANCE STATUS (ECOG):  2 HEENT: No visual changes, runny nose, sore throat, mouth sores or tenderness. Lungs:  No shortness of breath or cough.  No hemoptysis. Cardiac:  No chest pain, palpitations, orthopnea, or PND. GI:  No nausea, vomiting, diarrhea, constipation, melena or hematochezia. GU:  No urgency, frequency, dysuria, or hematuria. Musculoskeletal:  No back pain.  No joint pain.  No muscle tenderness. Extremities: No pain or swelling. Skin:  No rashes or skin changes. Neuro:  Dementia.  No headache, numbness or weakness, balance or coordination issues. Endocrine:  No diabetes.  Thyroid disease on  Synthroid.  No hot flashes or night sweats. Psych:  No mood changes, depression or anxiety. Pain:  No focal pain. Review of systems:  All other systems reviewed and found to be negative.  Physical Exam: Blood pressure (!) 143/72, pulse 60, temperature 97.5 F (36.4 C), temperature source Tympanic, resp. rate 18, weight 130 lb 8.2 oz (59.2 kg). GENERAL:  Thin elderly woman sitting comfortably in the exam room in no acute distress. MENTAL STATUS:  Alert and oriented to person, place and time. HEAD:  Graying hair pulled back.  Normocephalic, atraumatic, face symmetric, no Cushingoid features. EYES:  Glasses.  Brown eyes.  Pupils equal round and reactive to light and accomodation.  No conjunctivitis or scleral icterus. ENT:  Oropharynx clear without lesion.  Dentures.  Tongue normal. Mucous membranes moist.  RESPIRATORY:  Clear to auscultation without rales, wheezes or rhonchi. CARDIOVASCULAR:  Regular rate and rhythm without murmur, rub or gallop. CHEST WALL;  Port-a-cath. BREASTS:  Right sided mastectomy well healed.  No skin changes, erythema or nodularity.  Left breast without masses, skin changes or nipple discharge. ABDOMEN:  Soft, non-tender, with active bowel sounds, and no hepatosplenomegaly.  No masses. SKIN:  Vitiligo involving face, chest, and hands.  No rashes, ulcers or lesions. EXTREMITIES: Right arm lymphedema.  No lower extremity edema, no skin discoloration or tenderness.  No palpable cords. LYMPH NODES: No palpable cervical, supraclavicular, axillary or inguinal adenopathy  NEUROLOGICAL: Unremarkable. PSYCH:  Appropriate.   No visits with results within 3 Day(s) from this visit.  Latest known visit with results is:  Appointment on 12/09/2015  Component Date Value Ref Range Status  . WBC 12/09/2015 4.3  3.6 - 11.0 K/uL Final  . RBC 12/09/2015 5.19  3.80 - 5.20 MIL/uL Final  . Hemoglobin 12/09/2015 13.7  12.0 - 16.0 g/dL Final  . HCT 12/09/2015 42.1  35.0 - 47.0 % Final   . MCV 12/09/2015 81.1  80.0 - 100.0 fL Final  . MCH 12/09/2015 26.4  26.0 - 34.0 pg Final  . MCHC 12/09/2015 32.6  32.0 - 36.0 g/dL Final  . RDW 12/09/2015 16.0* 11.5 - 14.5 % Final  . Platelets 12/09/2015 166  150 - 440 K/uL Final  . Neutrophils Relative % 12/09/2015 65  % Final  . Neutro Abs 12/09/2015 2.7  1.4 - 6.5 K/uL Final  . Lymphocytes Relative 12/09/2015 22  % Final  . Lymphs Abs 12/09/2015 0.9* 1.0 - 3.6 K/uL Final  . Monocytes Relative 12/09/2015 13  % Final  . Monocytes Absolute 12/09/2015 0.6  0.2 - 0.9 K/uL Final  . Eosinophils Relative 12/09/2015 0  % Final  . Eosinophils Absolute 12/09/2015 0.0  0 - 0.7 K/uL Final  . Basophils Relative 12/09/2015 0  %  Final  . Basophils Absolute 12/09/2015 0.0  0 - 0.1 K/uL Final  . Sodium 12/09/2015 142  135 - 145 mmol/L Final  . Potassium 12/09/2015 3.1* 3.5 - 5.1 mmol/L Final  . Chloride 12/09/2015 110  101 - 111 mmol/L Final  . CO2 12/09/2015 26  22 - 32 mmol/L Final  . Glucose, Bld 12/09/2015 110* 65 - 99 mg/dL Final  . BUN 12/09/2015 16  6 - 20 mg/dL Final  . Creatinine, Ser 12/09/2015 1.03* 0.44 - 1.00 mg/dL Final  . Calcium 12/09/2015 9.4  8.9 - 10.3 mg/dL Final  . Total Protein 12/09/2015 7.3  6.5 - 8.1 g/dL Final  . Albumin 12/09/2015 4.1  3.5 - 5.0 g/dL Final  . AST 12/09/2015 16  15 - 41 U/L Final  . ALT 12/09/2015 8* 14 - 54 U/L Final  . Alkaline Phosphatase 12/09/2015 58  38 - 126 U/L Final  . Total Bilirubin 12/09/2015 0.5  0.3 - 1.2 mg/dL Final  . GFR calc non Af Amer 12/09/2015 47* >60 mL/min Final  . GFR calc Af Amer 12/09/2015 54* >60 mL/min Final   Comment: (NOTE) The eGFR has been calculated using the CKD EPI equation. This calculation has not been validated in all clinical situations. eGFR's persistently <60 mL/min signify possible Chronic Kidney Disease.   . Anion gap 12/09/2015 6  5 - 15 Final  . CA 27.29 12/10/2015 20.9  0.0 - 38.6 U/mL Final   Comment: (NOTE) Bayer Centaur/ACS methodology Performed  At: [facility] [place], [place] [id number] Lindon Romp MD HR:[id number]     Assessment:  Alexandra Glass is a 82 y.o. female with a history of stage II right breast cancer and stage IIIB right-sided lung cancer.  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   She underwent a right modified radical mastectomy and node dissection on 02/04/2011. Pathology revealed grade II multifocal disease (largest lesion 3.6 cm).  There was angiolymphatic invasion. Nine lymph nodes were negative. She received radiation to the chest wall and lymphatics from 04/25/2011 until 06/15/2011.  She is on Femara.    Mammogram on 06/26/2013 was negative.    CA27.29 has been followed: 29.6 on 04/07/2014, 21.6 on 08/06/2014, 28.9 on 11/12/2014, 13.7 on 06/10/2015, and 20.9 on 12/09/2015.  Bone density study on 03/19/2014 revealed osteopenia with a T score of -1.9 in L1-L4 and -1.6 in the right femur.  Bone density study on 03/21/2016 revealed osteopenia with a T score of -2.2 in the right femoral neck and -1.8 in the AP spine L1-L4.  She was diagnosed with stage IIIB right lung cancer in 2014.  Pathology revealed differentiated adenocarcinoma consistent with lung primary. EGFR and ALK were negative. She received 6 weeks of concurrent carboplatin and Taxol from 08/08/2012 until 10/08/2012.  CEA was 2.5 on 11/12/2014 and 2.1 on 06/10/2015.  Chest, abdomen, and pelvic CT scan on 01/27/2014 revealed stable RUL pulmonary nodule, 12 x 7 mm ground glass attenuation of the LUL nodule, and multiple bilateral aggressive appearing renal lesions.  The largest was 4.5 x 3.7 cm in the left kidney.  Lesions were highly suspicious for renal cell carcinoma.  Given her age and bilateral disease, a decision by tumor board was for ongoing surveillance.  Chest, abdomen, and pelvic CT scan 12/05/2014 revealed mild progression of the 2.7 cm spiculated medial right upper lobe pulmonary nodule. There was a stable 1.1 cm  subsolid left upper lobe pulmonary nodule. There was mild progression of subcarinal mediastinal adenopathy.  There were 4 heterogeneous enhancing solid renal masses (1 on the right and 3 on the left kidney) which were slightly enlarged and all suspicious for renal cell carcinoma. There was mild progression of left periaortic adenopathy. She had a 2.6 cm infrarenal abdominal aortic aneurysm.  Chest, abdomen, and pelvic CT scan on 06/09/2015 revealed a mild interval decrease size (2.4 x 1.7 cm) of medial right upper lobe pulmonary nodule.   There has been interval stability (1.1 x 0.8 cm) of previously described subsolid left upper lobe pulmonary nodule.   The pure ground-glass apical left upper lobe pulmonary nodule had mildly increased in size (1.4 x 1.2 cm).  There was stable mild subcarinal mediastinal and left para-aortic retroperitoneal lymphadenopathy.  There was stable bilateral indeterminate renal masses, suspicious for renal cell carcinoma based on prior contrast-enhanced studies.  Chest, abdomen, and pelvic CT scan on 12/07/2015 revealed several stable masses in the left kidney.  There was some mild left periaortic adenopathy (stable).  There was stable volume loss and paramediastinal density in the right suprahilar region.  There was sub solid nodularity in the left upper lobe (reduced to stable).  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   She is not interested in a biopsy or treatment for the enlarging renal masses.  Symptomatically, she feels good.  Exam is stable.   Plan: 1.  Review bone density study.  Progressive osteopenia on Femara.  Discuss consideration of Prolia.  Side effects reviewed.  Information provided.  Discuss dental clearance. 2.  Discuss cardiothoracic surgery consult re: aortic arch aneurysm.  Discuss ongoing surveillance. 3.  Continue Femara. 4.  Discuss BCI testing regarding benefit for extended adjuvant therapy.   Patient interested.  Determine cost to patient. 5.  Patient to follow-up with dentistry for clearance of Prolia. 6.  Port flush every 6-8 weeks. 7.  Schedule chest, abdomen, and pelvic CT scan without contrast on 06/08/2016. 8.  RTC on 02/29/2018 for MD assessment, labs (CBC with diff, CMP, CEA, CA27.29).   Lequita Asal, MD  03/23/2016, 11:11 AM

## 2016-03-23 NOTE — Patient Instructions (Signed)
Denosumab injection What is this medicine? DENOSUMAB (den oh sue mab) slows bone breakdown. Prolia is used to treat osteoporosis in women after menopause and in men. Xgeva is used to prevent bone fractures and other bone problems caused by cancer bone metastases. Xgeva is also used to treat giant cell tumor of the bone. COMMON BRAND NAME(S): Prolia, XGEVA What should I tell my health care provider before I take this medicine? They need to know if you have any of these conditions: -dental disease -eczema -infection or history of infections -kidney disease or on dialysis -low blood calcium or vitamin D -malabsorption syndrome -scheduled to have surgery or tooth extraction -taking medicine that contains denosumab -thyroid or parathyroid disease -an unusual reaction to denosumab, other medicines, foods, dyes, or preservatives -pregnant or trying to get pregnant -breast-feeding How should I use this medicine? This medicine is for injection under the skin. It is given by a health care professional in a hospital or clinic setting. If you are getting Prolia, a special MedGuide will be given to you by the pharmacist with each prescription and refill. Be sure to read this information carefully each time. For Prolia, talk to your pediatrician regarding the use of this medicine in children. Special care may be needed. For Xgeva, talk to your pediatrician regarding the use of this medicine in children. While this drug may be prescribed for children as young as 13 years for selected conditions, precautions do apply. What if I miss a dose? It is important not to miss your dose. Call your doctor or health care professional if you are unable to keep an appointment. What may interact with this medicine? Do not take this medicine with any of the following medications: -other medicines containing denosumab This medicine may also interact with the following medications: -medicines that suppress the immune  system -medicines that treat cancer -steroid medicines like prednisone or cortisone What should I watch for while using this medicine? Visit your doctor or health care professional for regular checks on your progress. Your doctor or health care professional may order blood tests and other tests to see how you are doing. Call your doctor or health care professional if you get a cold or other infection while receiving this medicine. Do not treat yourself. This medicine may decrease your body's ability to fight infection. You should make sure you get enough calcium and vitamin D while you are taking this medicine, unless your doctor tells you not to. Discuss the foods you eat and the vitamins you take with your health care professional. See your dentist regularly. Brush and floss your teeth as directed. Before you have any dental work done, tell your dentist you are receiving this medicine. Do not become pregnant while taking this medicine or for 5 months after stopping it. Women should inform their doctor if they wish to become pregnant or think they might be pregnant. There is a potential for serious side effects to an unborn child. Talk to your health care professional or pharmacist for more information. What side effects may I notice from receiving this medicine? Side effects that you should report to your doctor or health care professional as soon as possible: -allergic reactions like skin rash, itching or hives, swelling of the face, lips, or tongue -breathing problems -chest pain -fast, irregular heartbeat -feeling faint or lightheaded, falls -fever, chills, or any other sign of infection -muscle spasms, tightening, or twitches -numbness or tingling -skin blisters or bumps, or is dry, peels, or red -slow   healing or unexplained pain in the mouth or jaw -unusual bleeding or bruising Side effects that usually do not require medical attention (report to your doctor or health care professional  if they continue or are bothersome): -muscle pain -stomach upset, gas Where should I keep my medicine? This medicine is only given in a clinic, doctor's office, or other health care setting and will not be stored at home.  2017 Elsevier/Gold Standard (2015-05-07 10:06:55)  

## 2016-03-25 ENCOUNTER — Telehealth: Payer: Self-pay | Admitting: *Deleted

## 2016-03-25 LAB — URINE CULTURE

## 2016-03-25 NOTE — Telephone Encounter (Signed)
Called son larry and he reported that his mom was not having symptoms of UTI, no burning or irritation but voiding more often as she was on Wednesday.  [name] instructed to call us or her PCP if she developed symptoms of UTI, voiced understanding and reported that she sees her PCP on Monday 03-28-16

## 2016-03-28 ENCOUNTER — Ambulatory Visit (INDEPENDENT_AMBULATORY_CARE_PROVIDER_SITE_OTHER): Payer: Medicare Other | Admitting: Family Medicine

## 2016-03-28 ENCOUNTER — Telehealth: Payer: Self-pay | Admitting: Hematology and Oncology

## 2016-03-28 ENCOUNTER — Encounter: Payer: Self-pay | Admitting: Family Medicine

## 2016-03-28 VITALS — BP 140/80 | HR 70 | Ht 67.0 in | Wt 131.0 lb

## 2016-03-28 DIAGNOSIS — E782 Mixed hyperlipidemia: Secondary | ICD-10-CM | POA: Diagnosis not present

## 2016-03-28 DIAGNOSIS — E039 Hypothyroidism, unspecified: Secondary | ICD-10-CM | POA: Diagnosis not present

## 2016-03-28 DIAGNOSIS — I10 Essential (primary) hypertension: Secondary | ICD-10-CM | POA: Diagnosis not present

## 2016-03-28 DIAGNOSIS — E876 Hypokalemia: Secondary | ICD-10-CM

## 2016-03-28 MED ORDER — HYDROCHLOROTHIAZIDE 12.5 MG PO TABS: 12.5000 mg | ORAL_TABLET | Freq: Every day | ORAL | 6 refills | Status: DC

## 2016-03-28 MED ORDER — ATORVASTATIN CALCIUM 20 MG PO TABS: 20.0000 mg | ORAL_TABLET | Freq: Every day | ORAL | 6 refills | Status: DC

## 2016-03-28 MED ORDER — LEVOTHYROXINE SODIUM 100 MCG PO TABS: 100.0000 ug | ORAL_TABLET | Freq: Every day | ORAL | 6 refills | Status: DC

## 2016-03-28 MED ORDER — POTASSIUM CHLORIDE ER 10 MEQ PO TBCR: 10.0000 meq | EXTENDED_RELEASE_TABLET | Freq: Every day | ORAL | 6 refills | Status: DC

## 2016-03-28 NOTE — Telephone Encounter (Signed)
Pt came in with son and dtr-in-law. They were unable to get her blood today at Dr. Ronnald Ramp' office so pt will drink water and come later. They said Dr. Ronnald Ramp suggested in lieu of shots that she would be willing to write either Rx for Fosamax 70 mg or calcium 1200 mg  With Vit. D 800 units daily - if you agree. Please advise. Phone: [phone].

## 2016-03-28 NOTE — Progress Notes (Signed)
Name: Alexandra Glass   MRN: [id number]    DOB: 09-Sep-1924   Date:03/28/2016       Progress Note  Subjective  Chief Complaint  Chief Complaint  Patient presents with  . Hypertension  . Hyperlipidemia  . hypokalemia  . Hypothyroidism    Patient has history of hypokalemia and requires supplementation.   Hypertension  This is a chronic problem. The current episode started more than 1 year ago. The problem has been waxing and waning since onset. The problem is controlled. Pertinent negatives include no anxiety, blurred vision, chest pain, headaches, malaise/fatigue, neck pain, orthopnea, palpitations, peripheral edema, PND, shortness of breath or sweats. There are no associated agents to hypertension. There are no known risk factors for coronary artery disease. Past treatments include diuretics. The current treatment provides mild improvement. There are no compliance problems.  Hypertensive end-organ damage includes a thyroid problem. There is no history of angina, kidney disease, CAD/MI, CVA, heart failure, left ventricular hypertrophy, PVD, renovascular disease or retinopathy. There is no history of chronic renal disease or a hypertension causing med.  Hyperlipidemia  This is a chronic problem. The current episode started more than 1 year ago. The problem is controlled. Recent lipid tests were reviewed and are normal. Exacerbating diseases include hypothyroidism. She has no history of chronic renal disease, diabetes or liver disease. There are no known factors aggravating her hyperlipidemia. Pertinent negatives include no chest pain, focal sensory loss, focal weakness, leg pain, myalgias or shortness of breath. Current antihyperlipidemic treatment includes statins. The current treatment provides mild improvement of lipids. There are no compliance problems.  There are no known risk factors for coronary artery disease.  Thyroid Problem  Presents for follow-up visit. Symptoms include fatigue. Patient  reports no anxiety, cold intolerance, constipation, depressed mood, diarrhea, hair loss, palpitations, tremors or weight loss. The symptoms have been stable. Her past medical history is significant for hyperlipidemia. There is no history of diabetes or heart failure.    No problem-specific Assessment & Plan notes found for this encounter.   Past Medical History:  Diagnosis Date  . Breast cancer ([place])   . Hypertension   . Renal cancer ([place])   . Thyroid disease     Past Surgical History:  Procedure Laterality Date  . APPENDECTOMY    . BREAST BIOPSY Left    neg  . BREAST SURGERY Right   . MASTECTOMY Right 02/04/11   chemo/rad- takes femara  . tumor surg     removed from L) side  . VAGINAL HYSTERECTOMY      Family History  Problem Relation Age of Onset  . Cancer Sister     lung  . Cancer Sister     pancreas  . Cancer Sister     breast  . Breast cancer Neg Hx     Social History   Social History  . Marital status: Single    Spouse name: N/A  . Number of children: N/A  . Years of education: N/A   Occupational History  . Not on file.   Social History Main Topics  . Smoking status: Former Smoker    Types: Cigarettes    Quit date: 12/17/1973  . Smokeless tobacco: Never Used  . Alcohol use No  . Drug use: No  . Sexual activity: No   Other Topics Concern  . Not on file   Social History Narrative  . No narrative on file    Allergies  Allergen Reactions  . No Known Allergies  Review of Systems  Constitutional: Positive for fatigue. Negative for chills, fever, malaise/fatigue and weight loss.  HENT: Negative for ear discharge, ear pain and sore throat.   Eyes: Negative for blurred vision.  Respiratory: Negative for cough, sputum production, shortness of breath and wheezing.   Cardiovascular: Negative for chest pain, palpitations, orthopnea, leg swelling and PND.  Gastrointestinal: Negative for abdominal pain, blood in stool, constipation, diarrhea,  heartburn, melena and nausea.  Genitourinary: Negative for dysuria, frequency, hematuria and urgency.  Musculoskeletal: Negative for back pain, joint pain, myalgias and neck pain.  Skin: Negative for rash.  Neurological: Negative for dizziness, tingling, tremors, sensory change, focal weakness and headaches.  Endo/Heme/Allergies: Negative for environmental allergies, cold intolerance and polydipsia. Does not bruise/bleed easily.  Psychiatric/Behavioral: Negative for depression and suicidal ideas. The patient is not nervous/anxious and does not have insomnia.      Objective  Vitals:   03/28/16 0933  BP: 140/80  Pulse: 70  Weight: 131 lb (59.4 kg)  Height: '5\' 7"'$  (1.702 m)    Physical Exam  Constitutional: She is well-developed, well-nourished, and in no distress. No distress.  HENT:  Head: Normocephalic and atraumatic.  Right Ear: External ear normal.  Left Ear: External ear normal.  Nose: Nose normal.  Mouth/Throat: Oropharynx is clear and moist.  Eyes: Conjunctivae and EOM are normal. Pupils are equal, round, and reactive to light. Right eye exhibits no discharge. Left eye exhibits no discharge.  Neck: Normal range of motion. Neck supple. No JVD present. No thyromegaly present.  Cardiovascular: Normal rate, regular rhythm, normal heart sounds and intact distal pulses.  Exam reveals no gallop and no friction rub.   No murmur heard. Pulmonary/Chest: Effort normal and breath sounds normal. No respiratory distress. She has no wheezes. She has no rales. She exhibits no tenderness.  Abdominal: Soft. Bowel sounds are normal. She exhibits no mass. There is no tenderness. There is no guarding.  Musculoskeletal: Normal range of motion. She exhibits no edema.  Lymphadenopathy:    She has no cervical adenopathy.  Neurological: She is alert. She has normal reflexes.  Skin: Skin is warm and dry. She is not diaphoretic.  Psychiatric: Mood and affect normal.  Nursing note and vitals  reviewed.     Assessment & Plan  Problem List Items Addressed This Visit      Cardiovascular and Mediastinum   Essential hypertension - Primary   Relevant Medications   hydrochlorothiazide (HYDRODIURIL) 12.5 MG tablet   atorvastatin (LIPITOR) 20 MG tablet   Other Relevant Orders   Renal Function Panel     Endocrine   Hypothyroidism   Relevant Medications   levothyroxine (SYNTHROID, LEVOTHROID) 100 MCG tablet   Other Relevant Orders   TSH     Other   Hyperlipidemia   Relevant Medications   hydrochlorothiazide (HYDRODIURIL) 12.5 MG tablet   atorvastatin (LIPITOR) 20 MG tablet   Other Relevant Orders   Lipid Profile   Hypokalemia   Relevant Medications   potassium chloride (K-DUR) 10 MEQ tablet        Dr. Deeanne Deininger [facility]  03/28/16

## 2016-03-29 NOTE — Telephone Encounter (Signed)
Son called to check to see if the patient thad gotten a dental appt for evaluation for prolia yet.  Son reported that they were dealing with other appointments and labs at this time and that he would work on it.

## 2016-04-05 ENCOUNTER — Other Ambulatory Visit: Payer: Self-pay | Admitting: *Deleted

## 2016-04-05 ENCOUNTER — Other Ambulatory Visit: Payer: Self-pay

## 2016-04-05 ENCOUNTER — Telehealth: Payer: Self-pay

## 2016-04-05 ENCOUNTER — Telehealth: Payer: Self-pay | Admitting: *Deleted

## 2016-04-05 NOTE — Telephone Encounter (Signed)
Pt son called and asked could pt come in to have blood drawn through her port since they can "never get it at [facility]"- I told him I would send a message asking if this could happen. Also, pt has decided to "not do the injections for bone loss, but like to try Fosamax"- if you agree, could you please send in for the pt?

## 2016-04-05 NOTE — Telephone Encounter (Signed)
  OK to lab draw thru port.  OK for switch to Fosamax.  M

## 2016-04-05 NOTE — Telephone Encounter (Signed)
Discussed osteonecrosis of the jaw with all bisphosphonates with son and daughter in law.  Voiced understanding.  Son reports that he will get his mom a dental appointment for the first of the year and call us back with that appointment in order to get Fosamax started.  Thanks,  Leana Roe

## 2016-04-05 NOTE — Telephone Encounter (Signed)
Discussed osteonecrosis of the jaw with all bisphosphonates with son and daughter in law.  Voiced understanding.  Son reports that he will get his mom a dental appointment for the first of the year and call us back with that appointment in order to get Fosamax started.

## 2016-04-06 NOTE — Telephone Encounter (Signed)
Pt's son notified of Dr Rober Minion decision and told to call office to sched lab draw

## 2016-04-14 ENCOUNTER — Other Ambulatory Visit: Payer: Self-pay | Admitting: *Deleted

## 2016-04-14 MED ORDER — LETROZOLE 2.5 MG PO TABS: ORAL_TABLET | ORAL | 3 refills | Status: DC

## 2016-04-19 ENCOUNTER — Telehealth: Payer: Self-pay | Admitting: *Deleted

## 2016-04-19 NOTE — Telephone Encounter (Signed)
I called Alexandra Glass and told her that her insurance does not require prior auth but the test cost is 3500.00  and we will not know what the cost is for about 1 year.  She does have the ability to fill out financial profile and if she qualified for asst. That would be the max amount she would have to pay.  She states that I need to call her family member Constance Holster and I did and he says that she is going to be 49 in  Few months and he did not see the need to do test.  Also they would like Alexandra Glass to cont. On the AI for the additional years to help prevent the recurrence because she does not have any side effects from it.  I told them it should be fine and I would send a message to [place].

## 2016-04-26 ENCOUNTER — Other Ambulatory Visit: Payer: Self-pay

## 2016-04-26 DIAGNOSIS — E876 Hypokalemia: Secondary | ICD-10-CM

## 2016-04-26 MED ORDER — POTASSIUM CHLORIDE ER 10 MEQ PO TBCR: 10.0000 meq | EXTENDED_RELEASE_TABLET | Freq: Every day | ORAL | 6 refills | Status: DC

## 2016-05-04 ENCOUNTER — Inpatient Hospital Stay: Payer: Medicare Other

## 2016-05-09 ENCOUNTER — Other Ambulatory Visit: Payer: Self-pay

## 2016-05-11 ENCOUNTER — Inpatient Hospital Stay: Payer: Medicare Other

## 2016-05-18 ENCOUNTER — Other Ambulatory Visit: Payer: Self-pay | Admitting: Family Medicine

## 2016-05-18 ENCOUNTER — Inpatient Hospital Stay: Payer: Medicare Other

## 2016-05-18 ENCOUNTER — Inpatient Hospital Stay: Payer: Medicare Other | Attending: Hematology and Oncology

## 2016-05-18 ENCOUNTER — Other Ambulatory Visit: Payer: Self-pay

## 2016-05-18 DIAGNOSIS — Z7689 Persons encountering health services in other specified circumstances: Secondary | ICD-10-CM | POA: Diagnosis not present

## 2016-05-18 DIAGNOSIS — Z170 Estrogen receptor positive status [ER+]: Secondary | ICD-10-CM | POA: Insufficient documentation

## 2016-05-18 DIAGNOSIS — Z95828 Presence of other vascular implants and grafts: Secondary | ICD-10-CM

## 2016-05-18 DIAGNOSIS — C50911 Malignant neoplasm of unspecified site of right female breast: Secondary | ICD-10-CM | POA: Diagnosis not present

## 2016-05-18 MED ORDER — SODIUM CHLORIDE 0.9% FLUSH
10.0000 mL | INTRAVENOUS | Status: DC | PRN
  Administered 2016-05-18: 10 mL via INTRAVENOUS
  Filled 2016-05-18: qty 10

## 2016-05-18 MED ORDER — HEPARIN SOD (PORK) LOCK FLUSH 100 UNIT/ML IV SOLN
INTRAVENOUS | Status: AC
Start: 2016-05-18 — End: ?
  Filled 2016-05-18: qty 5

## 2016-05-18 MED ORDER — HEPARIN SOD (PORK) LOCK FLUSH 100 UNIT/ML IV SOLN
500.0000 [IU] | Freq: Once | INTRAVENOUS | Status: AC
  Administered 2016-05-18: 500 [IU] via INTRAVENOUS

## 2016-05-21 LAB — LIPID PANEL W/O CHOL/HDL RATIO
CHOLESTEROL TOTAL: 145 mg/dL
HDL: 49 mg/dL (ref >39)
LDL CALC: 87 mg/dL
Triglycerides: 47 mg/dL
VLDL CHOLESTEROL CAL: 9 mg/dL (ref 5–40)

## 2016-05-21 LAB — TSH: TSH: 0.131 u[IU]/mL — AB (ref 0.700–15.200)

## 2016-05-21 LAB — PLEASE NOTE

## 2016-05-24 LAB — RENAL FUNCTION PANEL
Albumin: 3.6 g/dL
BUN/Creatinine Ratio: 12
BUN: 11 mg/dL
CALCIUM: 9.5 mg/dL
CHLORIDE: 98 mmol/L (ref 96–106)
CO2: 15 mmol/L (ref 15–27)
CREATININE: 0.92 mg/dL
PHOSPHORUS: 3 mg/dL
POTASSIUM: 4.2 mmol/L (ref 3.7–5.2)
SODIUM: 139 mmol/L (ref 134–144)

## 2016-05-24 LAB — AMBIG ABBREV

## 2016-05-24 LAB — SPECIMEN STATUS REPORT

## 2016-06-05 ENCOUNTER — Encounter: Payer: Self-pay | Admitting: Hematology and Oncology

## 2016-06-08 ENCOUNTER — Ambulatory Visit
Admission: RE | Admit: 2016-06-08 | Discharge: 2016-06-08 | Disposition: A | Discharge status: Home / Self Care | Payer: Medicare Other | Source: Ambulatory Visit | Attending: Hematology and Oncology | Admitting: Hematology and Oncology

## 2016-06-08 DIAGNOSIS — C50911 Malignant neoplasm of unspecified site of right female breast: Secondary | ICD-10-CM | POA: Insufficient documentation

## 2016-06-08 DIAGNOSIS — R918 Other nonspecific abnormal finding of lung field: Secondary | ICD-10-CM | POA: Diagnosis not present

## 2016-06-08 DIAGNOSIS — Z170 Estrogen receptor positive status [ER+]: Secondary | ICD-10-CM | POA: Diagnosis not present

## 2016-06-08 DIAGNOSIS — I700 Atherosclerosis of aorta: Secondary | ICD-10-CM | POA: Diagnosis not present

## 2016-06-08 DIAGNOSIS — C3411 Malignant neoplasm of upper lobe, right bronchus or lung: Secondary | ICD-10-CM | POA: Diagnosis not present

## 2016-06-08 DIAGNOSIS — M8589 Other specified disorders of bone density and structure, multiple sites: Secondary | ICD-10-CM | POA: Diagnosis not present

## 2016-06-08 DIAGNOSIS — R911 Solitary pulmonary nodule: Secondary | ICD-10-CM | POA: Diagnosis not present

## 2016-06-08 DIAGNOSIS — R590 Localized enlarged lymph nodes: Secondary | ICD-10-CM | POA: Insufficient documentation

## 2016-06-08 DIAGNOSIS — N289 Disorder of kidney and ureter, unspecified: Secondary | ICD-10-CM | POA: Insufficient documentation

## 2016-06-08 DIAGNOSIS — J439 Emphysema, unspecified: Secondary | ICD-10-CM | POA: Insufficient documentation

## 2016-06-08 DIAGNOSIS — K7689 Other specified diseases of liver: Secondary | ICD-10-CM | POA: Diagnosis not present

## 2016-06-08 DIAGNOSIS — I7122 Aneurysm of the aortic arch, without rupture: Secondary | ICD-10-CM

## 2016-06-08 DIAGNOSIS — I712 Thoracic aortic aneurysm, without rupture: Secondary | ICD-10-CM | POA: Diagnosis not present

## 2016-06-15 ENCOUNTER — Encounter: Payer: Self-pay | Admitting: Hematology and Oncology

## 2016-06-15 ENCOUNTER — Inpatient Hospital Stay: Payer: Medicare Other

## 2016-06-15 ENCOUNTER — Inpatient Hospital Stay: Payer: Medicare Other | Attending: Hematology and Oncology

## 2016-06-15 ENCOUNTER — Inpatient Hospital Stay (HOSPITAL_BASED_OUTPATIENT_CLINIC_OR_DEPARTMENT_OTHER): Payer: Medicare Other | Admitting: Hematology and Oncology

## 2016-06-15 ENCOUNTER — Telehealth: Payer: Self-pay | Admitting: *Deleted

## 2016-06-15 VITALS — BP 135/67 | HR 57 | Temp 97.7°F | Resp 18 | Wt 127.9 lb

## 2016-06-15 DIAGNOSIS — C3411 Malignant neoplasm of upper lobe, right bronchus or lung: Secondary | ICD-10-CM

## 2016-06-15 DIAGNOSIS — M8580 Other specified disorders of bone density and structure, unspecified site: Secondary | ICD-10-CM

## 2016-06-15 DIAGNOSIS — Z87891 Personal history of nicotine dependence: Secondary | ICD-10-CM | POA: Insufficient documentation

## 2016-06-15 DIAGNOSIS — I7122 Aneurysm of the aortic arch, without rupture: Secondary | ICD-10-CM

## 2016-06-15 DIAGNOSIS — Z923 Personal history of irradiation: Secondary | ICD-10-CM

## 2016-06-15 DIAGNOSIS — R634 Abnormal weight loss: Secondary | ICD-10-CM

## 2016-06-15 DIAGNOSIS — Z9011 Acquired absence of right breast and nipple: Secondary | ICD-10-CM | POA: Insufficient documentation

## 2016-06-15 DIAGNOSIS — C3491 Malignant neoplasm of unspecified part of right bronchus or lung: Secondary | ICD-10-CM

## 2016-06-15 DIAGNOSIS — I10 Essential (primary) hypertension: Secondary | ICD-10-CM | POA: Diagnosis not present

## 2016-06-15 DIAGNOSIS — I712 Thoracic aortic aneurysm, without rupture: Secondary | ICD-10-CM

## 2016-06-15 DIAGNOSIS — Z7982 Long term (current) use of aspirin: Secondary | ICD-10-CM | POA: Insufficient documentation

## 2016-06-15 DIAGNOSIS — N2889 Other specified disorders of kidney and ureter: Secondary | ICD-10-CM

## 2016-06-15 DIAGNOSIS — Z9221 Personal history of antineoplastic chemotherapy: Secondary | ICD-10-CM | POA: Insufficient documentation

## 2016-06-15 DIAGNOSIS — Z79811 Long term (current) use of aromatase inhibitors: Secondary | ICD-10-CM | POA: Insufficient documentation

## 2016-06-15 DIAGNOSIS — Z170 Estrogen receptor positive status [ER+]: Secondary | ICD-10-CM | POA: Insufficient documentation

## 2016-06-15 DIAGNOSIS — C50911 Malignant neoplasm of unspecified site of right female breast: Secondary | ICD-10-CM | POA: Diagnosis not present

## 2016-06-15 DIAGNOSIS — M8589 Other specified disorders of bone density and structure, multiple sites: Secondary | ICD-10-CM

## 2016-06-15 DIAGNOSIS — Z853 Personal history of malignant neoplasm of breast: Secondary | ICD-10-CM | POA: Diagnosis not present

## 2016-06-15 DIAGNOSIS — E079 Disorder of thyroid, unspecified: Secondary | ICD-10-CM | POA: Insufficient documentation

## 2016-06-15 DIAGNOSIS — R350 Frequency of micturition: Secondary | ICD-10-CM

## 2016-06-15 DIAGNOSIS — Z95828 Presence of other vascular implants and grafts: Secondary | ICD-10-CM

## 2016-06-15 LAB — COMPREHENSIVE METABOLIC PANEL
ALT: 11 U/L — ABNORMAL LOW (ref 14–54)
AST: 17 U/L (ref 15–41)
Albumin: 3.9 g/dL (ref 3.5–5.0)
Alkaline Phosphatase: 59 U/L (ref 38–126)
Anion gap: 6 (ref 5–15)
BUN: 16 mg/dL (ref 6–20)
CO2: 29 mmol/L (ref 22–32)
Calcium: 9.4 mg/dL (ref 8.9–10.3)
Chloride: 105 mmol/L (ref 101–111)
Creatinine, Ser: 0.92 mg/dL (ref 0.44–1.00)
GFR calc Af Amer: >60 mL/min (ref >60)
GFR calc non Af Amer: 54 mL/min — ABNORMAL LOW (ref >60)
Glucose, Bld: 131 mg/dL — ABNORMAL HIGH (ref 65–99)
Potassium: 3.2 mmol/L — ABNORMAL LOW (ref 3.5–5.1)
Sodium: 140 mmol/L (ref 135–145)
Total Bilirubin: 0.5 mg/dL (ref 0.3–1.2)
Total Protein: 7.2 g/dL (ref 6.5–8.1)

## 2016-06-15 LAB — CBC WITH DIFFERENTIAL/PLATELET
Basophils Absolute: 0 10*3/uL (ref 0–0.1)
Basophils Relative: 1 %
Eosinophils Absolute: 0 10*3/uL (ref 0–0.7)
Eosinophils Relative: 0 %
HCT: 39.1 % (ref 35.0–47.0)
Hemoglobin: 12.4 g/dL (ref 12.0–16.0)
Lymphocytes Relative: 15 %
Lymphs Abs: 0.5 10*3/uL — ABNORMAL LOW (ref 1.0–3.6)
MCH: 24.9 pg — ABNORMAL LOW (ref 26.0–34.0)
MCHC: 31.7 g/dL — ABNORMAL LOW (ref 32.0–36.0)
MCV: 78.7 fL — ABNORMAL LOW (ref 80.0–100.0)
Monocytes Absolute: 0.3 10*3/uL (ref 0.2–0.9)
Monocytes Relative: 10 %
Neutro Abs: 2.4 10*3/uL (ref 1.4–6.5)
Neutrophils Relative %: 74 %
Platelets: 180 10*3/uL (ref 150–440)
RBC: 4.97 MIL/uL (ref 3.80–5.20)
RDW: 17.7 % — ABNORMAL HIGH (ref 11.5–14.5)
WBC: 3.2 10*3/uL — ABNORMAL LOW (ref 3.6–11.0)

## 2016-06-15 LAB — T4, FREE: Free T4: 1.18 ng/dL — ABNORMAL HIGH (ref 0.61–1.12)

## 2016-06-15 MED ORDER — HEPARIN SOD (PORK) LOCK FLUSH 100 UNIT/ML IV SOLN
INTRAVENOUS | Status: AC
  Filled 2016-06-15: qty 5

## 2016-06-15 MED ORDER — HEPARIN SOD (PORK) LOCK FLUSH 100 UNIT/ML IV SOLN
500.0000 [IU] | Freq: Once | INTRAVENOUS | Status: AC
  Administered 2016-06-15: 500 [IU] via INTRAVENOUS

## 2016-06-15 MED ORDER — SODIUM CHLORIDE 0.9% FLUSH
10.0000 mL | INTRAVENOUS | Status: DC | PRN
Start: 2016-06-15 — End: 2016-06-15
  Administered 2016-06-15: 10 mL via INTRAVENOUS
  Filled 2016-06-15: qty 10

## 2016-06-15 NOTE — Progress Notes (Signed)
[facility] day:  06/15/16  Chief Complaint: Alexandra Glass is a 83 y.o. female with a history of stage II breast cancer and stage IIIB lung cancer who is seen for review of interval imaging and 3 month assessment.  HPI: The patient was last seen in the medical oncology clinic on 03/23/2016.  At that time, she was doing well on Femara.  Exam was stable.  She was awaiting dental clearance for Prolia.  Chest, abdomen, and pelvic CT scan on 06/08/2016 revealed a stable exam with no new or progressive findings.  There were stable multiple left renal lesions.  There was stable mild left para-aortic lymphadenopathy, some of which were mineralized.  There was stable appearance of volume loss/consolidation medial right upper low, likely radiation related.  There was sub solid anterior left apical nodule and adjacent spiculated opacity with slight interval decrease in size since prior study.  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  During the interim, she has felt pretty good.  She notes that she went to the dentist and had a "deep cleaning".  During the next 2-3 weeks, she sees the dentist for dental clearance.  Although she is "eating as much as I can", she is still losing weight. She states that her niece and daughter-in-law brings her food.  She also cooks for herself.   Past Medical History:  Diagnosis Date  . Breast cancer ([place])   . Hypertension   . Renal cancer ([place])   . Thyroid disease     Past Surgical History:  Procedure Laterality Date  . APPENDECTOMY    . BREAST BIOPSY Left    neg  . BREAST SURGERY Right   . MASTECTOMY Right 02/04/11   chemo/rad- takes femara  . tumor surg     removed from L) side  . VAGINAL HYSTERECTOMY      Family History  Problem Relation Age of Onset  . Cancer Sister     lung  . Cancer Sister     pancreas  . Cancer Sister     breast  . Breast cancer Neg Hx     Social History:   reports that she quit smoking about 42 years ago. Her smoking use included Cigarettes. She has never used smokeless tobacco. She reports that she does not drink alcohol or use drugs.  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  She lives in [place].  Her daughter-in-law is Apolonio Schneiders.  The patient is accompanied by her son, [name], today.  Allergies:  Allergies  Allergen Reactions  . No Known Allergies     Current Medications: Current Outpatient Prescriptions  Medication Sig Dispense Refill  . aspirin 325 MG tablet Take 1 tablet (325 mg total) by mouth daily. 90 tablet 0  . atorvastatin (LIPITOR) 20 MG tablet Take 1 tablet (20 mg total) by mouth daily. 30 tablet 6  . donepezil (ARICEPT) 10 MG tablet Take 10 mg by mouth at bedtime. Potter    . hydrochlorothiazide (HYDRODIURIL) 12.5 MG tablet Take 1 tablet (12.5 mg total) by mouth daily. 30 tablet 6  . latanoprost (XALATAN) 0.005 % ophthalmic solution     . letrozole (FEMARA) 2.5 MG tablet TAKE (1) TABLET BY MOUTH EVERY DAY 30 tablet 3  . levothyroxine (SYNTHROID, LEVOTHROID) 100 MCG tablet Take 1 tablet (100 mcg total) by mouth daily before breakfast. 30 tablet 6  . memantine (NAMENDA) 5 MG tablet Take 5 mg by mouth 2 (two) times daily. Dr  Potter    . potassium chloride (K-DUR) 10 MEQ tablet Take 1 tablet (10 mEq total) by mouth daily. 30 tablet 6  . vitamin B-12 (CYANOCOBALAMIN) 1000 MCG tablet Take 1,000 mcg by mouth daily.     No current facility-administered medications for this visit.    Facility-Administered Medications Ordered in Other Visits  Medication Dose Route Frequency Provider Last Rate Last Dose  . sodium chloride flush (NS) 0.9 % injection 10 mL  10 mL Intravenous PRN Lloyd Huger, MD   10 mL at 06/15/16 1045    Review of Systems:  GENERAL:  Feels "pretty good".  Active.  No fevers or sweats.  Weight down 3 pounds. PERFORMANCE STATUS (ECOG):  2 HEENT: No visual changes, runny nose, sore throat, mouth sores or tenderness.  Awaiting  dental clearance. Lungs:  No shortness of breath or cough.  No hemoptysis. Cardiac:  No chest pain, palpitations, orthopnea, or PND. GI:  Eating "as much as I can".  No nausea, vomiting, diarrhea, constipation, melena or hematochezia. GU:  Frequency.  No urgency, dysuria, or hematuria. Musculoskeletal:  No back pain.  No joint pain.  No muscle tenderness. Extremities: No pain or swelling. Skin:  No rashes or skin changes. Neuro:  Dementia.  No headache, numbness or weakness, balance or coordination issues. Endocrine:  No diabetes.  Thyroid disease on Synthroid.  No hot flashes or night sweats. Psych:  No mood changes, depression or anxiety. Pain:  No focal pain. Review of systems:  All other systems reviewed and found to be negative.  Physical Exam: Blood pressure 135/67, pulse (!) 57, temperature 97.7 F (36.5 C), temperature source Tympanic, resp. rate 18, weight 127 lb 13.9 oz (58 kg). GENERAL:  Thin elderly woman with marked vitiligo sitting comfortably in the exam room in no acute distress. MENTAL STATUS:  Alert and oriented to person, place and time. HEAD:  Graying hair pulled back.  Normocephalic, atraumatic, face symmetric, no Cushingoid features. EYES:  Glasses.  Brown eyes.  Pupils equal round and reactive to light and accomodation.  No conjunctivitis or scleral icterus. ENT:  Oropharynx clear without lesion.  Dentures.  Tongue normal. Mucous membranes moist.  RESPIRATORY:  Clear to auscultation without rales, wheezes or rhonchi. CARDIOVASCULAR:  Regular rate and rhythm without murmur, rub or gallop. CHEST WALL;  Port-a-cath. BREASTS:  Right sided mastectomy well healed.  No skin changes, erythema or nodularity.  Left breast without masses, skin changes or nipple discharge. ABDOMEN:  Soft, non-tender, with active bowel sounds, and no hepatosplenomegaly.  No masses. SKIN:  Vitiligo involving face, chest, and hands.  No rashes, ulcers or lesions. EXTREMITIES: Right arm lymphedema.   No lower extremity edema, no skin discoloration or tenderness.  No palpable cords. LYMPH NODES: No palpable cervical, supraclavicular, axillary or inguinal adenopathy  NEUROLOGICAL: Unremarkable. PSYCH:  Appropriate.   Imaging studies:  01/27/2014:  Chest, abdomen, and pelvic CT revealed stable RUL pulmonary nodule, 12 x 7 mm ground glass attenuation of the LUL nodule, and multiple bilateral aggressive appearing renal lesions.  The largest was 4.5 x 3.7 cm in the left kidney.  Lesions were highly suspicious for renal cell carcinoma.  Given her age and bilateral disease, a decision by tumor board was for ongoing surveillance. 12/05/2014:  Chest, abdomen, and pelvic CT revealed mild progression of the 2.7 cm spiculated medial right upper lobe pulmonary nodule. There was a stable 1.1 cm subsolid left upper lobe pulmonary nodule. There was mild progression of subcarinal mediastinal adenopathy. There were  4 heterogeneous enhancing solid renal masses (1 on the right and 3 on the left kidney) which were slightly enlarged and all suspicious for renal cell carcinoma. There was mild progression of left periaortic adenopathy. She had a 2.6 cm infrarenal abdominal aortic aneurysm. 06/09/2015:  Chest, abdomen, and pelvic CT revealed a mild interval decrease size (2.4 x 1.7 cm) of medial right upper lobe pulmonary nodule.   There has been interval stability (1.1 x 0.8 cm) of previously described subsolid left upper lobe pulmonary nodule.   The pure ground-glass apical left upper lobe pulmonary nodule had mildly increased in size (1.4 x 1.2 cm).  There was stable mild subcarinal mediastinal and left para-aortic retroperitoneal lymphadenopathy.  There was stable bilateral indeterminate renal masses, suspicious for renal cell carcinoma based on prior contrast-enhanced studies. 12/07/2015:  Chest, abdomen, and pelvic CT evealed several stable masses in the left kidney.  There was some mild left periaortic adenopathy  (stable).  There was stable volume loss and paramediastinal density in the right suprahilar region.  There was sub solid nodularity in the left upper lobe (reduced to stable).  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  06/08/2016:  Chest, abdomen, and pelvic CT revealed a stable exam with no new or progressive findings.  There were stable multiple left renal lesions.  There was stable mild left para-aortic lymphadenopathy, some of which were mineralized.  There was stable appearance of volume loss/consolidation medial right upper low, likely radiation related.  There was sub solid anterior left apical nodule and adjacent spiculated opacity with slight interval decrease in size since prior  Study.  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   No visits with results within 3 Day(s) from this visit.  Latest known visit with results is:  Orders Only on 05/18/2016  Component Date Value Ref Range Status  . Glucose 05/18/2016 CANCELED  mg/dL Final-Edited   Comment: We have received your request for additional testing, however we are unable to add the test you requested.  The following test(s) were not performed:  Result canceled by the ancillary   . BUN 05/18/2016 11  mg/dL Final   Comment:               No patient age and/or gender provided              Age                Female          Female           0 - 11 months        3 - 24         3 - 85           1 - 55 years         71 - 33         5 - 49          18 - 74 years         82 - 71         6 - 58          40 - 9 years         71 - 74         6 - 68          60 - 39 years  8 - 27         8 - 27              >89 years        10 - 73        10 - 5   . Creatinine, Ser 05/18/2016 0.92  mg/dL Final   Comment:               No patient age and/or gender provided              Age                Female          Female           0 - 60 days        .44 - 1.19     .44 -  1.19     61 days - 11 months      .17 - 1.18     .17 - 1.18           1 -  2 years       .19 -  .42     .19 -  .42           3 -  4 years       .26 -  .51     .26 -  .51           5 -  6 years       .30 -  .59     .30 -  .59           7 -  8 years       .37 -  .62     .37 -  .62           9 - 10 years       .39 -  .70     .39 -  .70          11 - 12 years       .42 -  .75     .42 -  .75          13 - 14 years       .49 -  .90     .49 -  .90              >14 years       .76 - 1.27     .57 - 1.00   . GFR calc non Af Amer 05/18/2016 CANCELED  mL/min/1.73 Final-Edited   Comment: Unable to calculate GFR.  Age and/or sex not provided or age <31 years old.  Result canceled by the ancillary   . GFR calc Af Amer 05/18/2016 CANCELED  mL/min/1.73 Final-Edited   Comment: Unable to calculate GFR.  Age and/or sex not provided or age <26 years old.  Result canceled by the ancillary   . BUN/Creatinine Ratio 05/18/2016 12   Final   Comment:               No patient age and/or gender provided            Age                  Female          Female    0 days   -  7 days  9 - 25         9 - 26    8 days   - 30 days          8 - 32        10 - 33    1 month  -  6 months       11 - 57        11 - 54    7 months -  1 year         20 - 71        20 - 71    2 years  -  5 years        47 - 48        19 - 25    6 years  - 18 years        81 - 32        13 - 80   13 years  - 51 years        61 - 63        10 - 69   18 years  - 51 years         9 - 78         9 - 32              >59 years        10 - 24        8 - 17   . Sodium 05/18/2016 139  134 - 144 mmol/L Final  . Potassium 05/18/2016 4.2  3.7 - 5.2 mmol/L Final  . Chloride 05/18/2016 98  96 - 106 mmol/L Final  . CO2 05/18/2016 15  15 - 27 mmol/L Final  . Calcium 05/18/2016 9.5  mg/dL Final   Comment:               No patient age and/or gender provided              Age                Female          Female           0 - 10 days        8.6 -  10.4     8.6 - 10.4     11 days -  1 year        9.2 - 11.0     9.2 - 11.0           2 - 11 years       9.1 - 10.5     9.1 - 10.5          12 - 17 years       8.9 - 10.4     8.9 - 10.4          18 - 59 years       8.7 - 10.2     8.7 - 10.2              >59 years       8.6 - 10.2     8.7 - 10.3   . Phosphorus 05/18/2016 3.0  mg/dL Final   Comment:               No patient age and/or gender provided              Age  Female          Female           0 -  1 year        2.5 - 7.1      2.5 - 7.1           2 -  9 years       2.8 - 6.2      2.8 - 6.2          10 - 19 years       2.5 - 5.6      2.5 - 5.3              >19 years       2.5 - 4.5      2.5 - 4.5   . Albumin 05/18/2016 3.6  g/dL Final   Comment:               No patient age and/or gender provided              Age                Female          Female           0 -  2 years       3.4 - 4.2      3.4 - 4.2           3 - 59 years       3.5 - 5.5      3.5 - 5.5          60 - 69 years       3.6 - 4.8      3.6 - 4.8          70 - 79 years       3.5 - 4.8      3.5 - 4.8          80 - 89 years       3.5 - 4.7      3.5 - 4.7              >89 years       3.2 - 4.6      3.2 - 4.6   . Isac Caddy 05/18/2016 Comment   Final   Comment: A hand-written panel/profile was received from your office. In accordance with the [facility] Ambiguous Test Code Policy dated July 6834, we have completed your order by using the closest currently or formerly recognized AMA panel.  We have assigned Renal Panel (10), Test Code [phone] to this request.  If this is not the testing you wished to receive on this specimen, please contact the [facility] Client Inquiry/Technical Services Department to clarify the test order.  We appreciate your business.   Marland Kitchen specimen status report 05/18/2016 Comment   Final   Comment: Written Authorization Written Authorization Written Authorization Received. Authorization received from [facility] 05-20-2016 Logged by Lisabeth Pick  [place]     Assessment:  Alexandra Glass is a 83 y.o. female with a history of stage II right breast cancer and stage IIIB right-sided lung cancer.  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   She underwent a right modified radical mastectomy and node dissection on 02/04/2011. Pathology revealed grade II multifocal disease (largest lesion 3.6 cm).  There was angiolymphatic invasion. Nine lymph nodes were negative. She received radiation to the chest wall and lymphatics from  04/25/2011 until 06/15/2011.  She is on Femara.    Mammogram on 11/23/2015 revealed no evidence of malignancy.    CA27.29 has been followed: 29.6 on 04/07/2014, 21.6 on 08/06/2014, 28.9 on 11/12/2014, 13.7 on 06/10/2015, and 20.9 on 12/09/2015.  Bone density study on 03/19/2014 revealed osteopenia with a T score of -1.9 in L1-L4 and -1.6 in the right femur.  Bone density study on 03/21/2016 revealed osteopenia with a T score of -2.2 in the right femoral neck and -1.8 in the AP spine L1-L4.  She was diagnosed with stage IIIB right lung cancer in 2014.  Pathology revealed differentiated adenocarcinoma consistent with lung primary. EGFR and ALK were negative. She received 6 weeks of concurrent carboplatin and Taxol from 08/08/2012 until 10/08/2012.  CEA was 2.5 on 11/12/2014 and 2.1 on 06/10/2015.  Chest, abdomen, and pelvic CT on 01/27/2014 revealed stable RUL pulmonary nodule, 12 x 7 mm ground glass attenuation of the LUL nodule, and multiple bilateral aggressive appearing renal lesions.  The largest was 4.5 x 3.7 cm in the left kidney.  Lesions were highly suspicious for renal cell carcinoma.  Given her age and bilateral disease, a decision by tumor board was for ongoing surveillance.  Chest, abdomen, and pelvic CT on 06/08/2016 revealed a stable exam with no new or progressive findings.  She is not interested in a biopsy or treatment for the enlarging renal masses.  Symptomatically, she feels good.  She continues to lose  weight.  She has some urinary frequency.  TSH was 0.131 (low) on 05/18/2016.  Exam is stable.   Plan: 1.  Labs today:  CBC with diff, CMP, CA27.29. 2.  Add:  free T4 today. 3.  Review scans.  No evidence of progression of the renal lesions or aortic aneurysm. 4.  Discuss concern for weight loss possibly related to thyroid supplementation.  Check free T4 today. 5.  Await dental clearance before initiation of Prolia. 6.  UA and culture (patient was unable to provide today). 7.  Port flush every 6-8 weeks. 8.  RTC in 3 months for MD assessment, labs (BMP), and Prolia.   Lequita Asal, MD  06/15/2016, 11:50 AM

## 2016-06-15 NOTE — Telephone Encounter (Signed)
Called son [name] and reported that patient's potassium was low and that she should increase her kcl 10 meq daily to bid for 3 days, voiced understanding. Encouraged him to have her f/u with pcp r/t kcl. Voiced understanding.

## 2016-06-16 ENCOUNTER — Other Ambulatory Visit: Payer: Self-pay

## 2016-06-16 LAB — CANCER ANTIGEN 27.29: CA 27.29: 24.2 U/mL (ref 0.0–38.6)

## 2016-06-16 LAB — CEA: CEA: 2.2 ng/mL (ref 0.0–4.7)

## 2016-06-16 MED ORDER — LEVOTHYROXINE SODIUM 88 MCG PO TABS: 88.0000 ug | ORAL_TABLET | Freq: Every day | ORAL | 1 refills | Status: DC

## 2016-06-16 MED ORDER — LEVOTHYROXINE SODIUM 75 MCG PO TABS: 75.0000 ug | ORAL_TABLET | Freq: Every day | ORAL | 1 refills | Status: DC

## 2016-06-20 DIAGNOSIS — E538 Deficiency of other specified B group vitamins: Secondary | ICD-10-CM | POA: Diagnosis not present

## 2016-06-20 DIAGNOSIS — Z8673 Personal history of transient ischemic attack (TIA), and cerebral infarction without residual deficits: Secondary | ICD-10-CM | POA: Diagnosis not present

## 2016-06-28 DIAGNOSIS — I10 Essential (primary) hypertension: Secondary | ICD-10-CM | POA: Diagnosis not present

## 2016-06-28 DIAGNOSIS — Z853 Personal history of malignant neoplasm of breast: Secondary | ICD-10-CM | POA: Diagnosis not present

## 2016-06-28 DIAGNOSIS — Z8673 Personal history of transient ischemic attack (TIA), and cerebral infarction without residual deficits: Secondary | ICD-10-CM | POA: Diagnosis not present

## 2016-06-28 DIAGNOSIS — E079 Disorder of thyroid, unspecified: Secondary | ICD-10-CM | POA: Diagnosis not present

## 2016-06-28 DIAGNOSIS — E538 Deficiency of other specified B group vitamins: Secondary | ICD-10-CM | POA: Diagnosis not present

## 2016-06-28 DIAGNOSIS — Z9181 History of falling: Secondary | ICD-10-CM | POA: Diagnosis not present

## 2016-06-28 DIAGNOSIS — Z7982 Long term (current) use of aspirin: Secondary | ICD-10-CM | POA: Diagnosis not present

## 2016-07-06 DIAGNOSIS — E538 Deficiency of other specified B group vitamins: Secondary | ICD-10-CM | POA: Diagnosis not present

## 2016-07-06 DIAGNOSIS — Z8673 Personal history of transient ischemic attack (TIA), and cerebral infarction without residual deficits: Secondary | ICD-10-CM | POA: Diagnosis not present

## 2016-07-06 DIAGNOSIS — I10 Essential (primary) hypertension: Secondary | ICD-10-CM | POA: Diagnosis not present

## 2016-07-06 DIAGNOSIS — Z9181 History of falling: Secondary | ICD-10-CM | POA: Diagnosis not present

## 2016-07-06 DIAGNOSIS — E079 Disorder of thyroid, unspecified: Secondary | ICD-10-CM | POA: Diagnosis not present

## 2016-07-06 DIAGNOSIS — Z7982 Long term (current) use of aspirin: Secondary | ICD-10-CM | POA: Diagnosis not present

## 2016-07-06 DIAGNOSIS — Z853 Personal history of malignant neoplasm of breast: Secondary | ICD-10-CM | POA: Diagnosis not present

## 2016-07-19 DIAGNOSIS — I10 Essential (primary) hypertension: Secondary | ICD-10-CM | POA: Diagnosis not present

## 2016-07-19 DIAGNOSIS — Z8673 Personal history of transient ischemic attack (TIA), and cerebral infarction without residual deficits: Secondary | ICD-10-CM | POA: Diagnosis not present

## 2016-07-19 DIAGNOSIS — Z9181 History of falling: Secondary | ICD-10-CM | POA: Diagnosis not present

## 2016-07-19 DIAGNOSIS — Z7982 Long term (current) use of aspirin: Secondary | ICD-10-CM | POA: Diagnosis not present

## 2016-07-19 DIAGNOSIS — Z853 Personal history of malignant neoplasm of breast: Secondary | ICD-10-CM | POA: Diagnosis not present

## 2016-07-19 DIAGNOSIS — E079 Disorder of thyroid, unspecified: Secondary | ICD-10-CM | POA: Diagnosis not present

## 2016-07-19 DIAGNOSIS — E538 Deficiency of other specified B group vitamins: Secondary | ICD-10-CM | POA: Diagnosis not present

## 2016-07-20 ENCOUNTER — Telehealth: Payer: Self-pay | Admitting: *Deleted

## 2016-07-20 NOTE — Telephone Encounter (Signed)
Received letter from Dr. Loney Laurence DDs that the patient was recently completed periodontal care.  She is inflammation free with no acive periodontal disease and should be fine to proceed with cancer therapy as needed.

## 2016-07-27 ENCOUNTER — Inpatient Hospital Stay: Payer: Medicare Other | Attending: Hematology and Oncology

## 2016-07-27 DIAGNOSIS — C50911 Malignant neoplasm of unspecified site of right female breast: Secondary | ICD-10-CM | POA: Diagnosis not present

## 2016-07-27 DIAGNOSIS — Z9221 Personal history of antineoplastic chemotherapy: Secondary | ICD-10-CM | POA: Insufficient documentation

## 2016-07-27 DIAGNOSIS — Z452 Encounter for adjustment and management of vascular access device: Secondary | ICD-10-CM | POA: Insufficient documentation

## 2016-07-27 DIAGNOSIS — Z79811 Long term (current) use of aromatase inhibitors: Secondary | ICD-10-CM | POA: Insufficient documentation

## 2016-07-27 DIAGNOSIS — Z170 Estrogen receptor positive status [ER+]: Secondary | ICD-10-CM | POA: Diagnosis not present

## 2016-07-27 DIAGNOSIS — M8580 Other specified disorders of bone density and structure, unspecified site: Secondary | ICD-10-CM | POA: Insufficient documentation

## 2016-07-27 DIAGNOSIS — Z923 Personal history of irradiation: Secondary | ICD-10-CM | POA: Diagnosis not present

## 2016-07-27 DIAGNOSIS — Z9011 Acquired absence of right breast and nipple: Secondary | ICD-10-CM | POA: Insufficient documentation

## 2016-07-27 DIAGNOSIS — Z853 Personal history of malignant neoplasm of breast: Secondary | ICD-10-CM | POA: Insufficient documentation

## 2016-07-27 DIAGNOSIS — C3491 Malignant neoplasm of unspecified part of right bronchus or lung: Secondary | ICD-10-CM | POA: Diagnosis not present

## 2016-07-27 DIAGNOSIS — Z95828 Presence of other vascular implants and grafts: Secondary | ICD-10-CM

## 2016-07-27 MED ORDER — SODIUM CHLORIDE 0.9% FLUSH
10.0000 mL | INTRAVENOUS | Status: DC | PRN
  Administered 2016-07-27: 10 mL via INTRAVENOUS
  Filled 2016-07-27: qty 10

## 2016-07-27 MED ORDER — HEPARIN SOD (PORK) LOCK FLUSH 100 UNIT/ML IV SOLN
INTRAVENOUS | Status: AC
  Filled 2016-07-27: qty 5

## 2016-07-27 MED ORDER — HEPARIN SOD (PORK) LOCK FLUSH 100 UNIT/ML IV SOLN
500.0000 [IU] | Freq: Once | INTRAVENOUS | Status: AC
  Administered 2016-07-27: 500 [IU] via INTRAVENOUS

## 2016-07-28 DIAGNOSIS — Z9181 History of falling: Secondary | ICD-10-CM | POA: Diagnosis not present

## 2016-07-28 DIAGNOSIS — I10 Essential (primary) hypertension: Secondary | ICD-10-CM | POA: Diagnosis not present

## 2016-07-28 DIAGNOSIS — Z8673 Personal history of transient ischemic attack (TIA), and cerebral infarction without residual deficits: Secondary | ICD-10-CM | POA: Diagnosis not present

## 2016-07-28 DIAGNOSIS — E538 Deficiency of other specified B group vitamins: Secondary | ICD-10-CM | POA: Diagnosis not present

## 2016-07-28 DIAGNOSIS — Z7982 Long term (current) use of aspirin: Secondary | ICD-10-CM | POA: Diagnosis not present

## 2016-07-28 DIAGNOSIS — E079 Disorder of thyroid, unspecified: Secondary | ICD-10-CM | POA: Diagnosis not present

## 2016-07-28 DIAGNOSIS — Z853 Personal history of malignant neoplasm of breast: Secondary | ICD-10-CM | POA: Diagnosis not present

## 2016-08-04 DIAGNOSIS — I10 Essential (primary) hypertension: Secondary | ICD-10-CM | POA: Diagnosis not present

## 2016-08-04 DIAGNOSIS — Z7982 Long term (current) use of aspirin: Secondary | ICD-10-CM | POA: Diagnosis not present

## 2016-08-04 DIAGNOSIS — Z9181 History of falling: Secondary | ICD-10-CM | POA: Diagnosis not present

## 2016-08-04 DIAGNOSIS — E538 Deficiency of other specified B group vitamins: Secondary | ICD-10-CM | POA: Diagnosis not present

## 2016-08-04 DIAGNOSIS — E079 Disorder of thyroid, unspecified: Secondary | ICD-10-CM | POA: Diagnosis not present

## 2016-08-04 DIAGNOSIS — Z853 Personal history of malignant neoplasm of breast: Secondary | ICD-10-CM | POA: Diagnosis not present

## 2016-08-04 DIAGNOSIS — Z8673 Personal history of transient ischemic attack (TIA), and cerebral infarction without residual deficits: Secondary | ICD-10-CM | POA: Diagnosis not present

## 2016-08-11 DIAGNOSIS — E538 Deficiency of other specified B group vitamins: Secondary | ICD-10-CM | POA: Diagnosis not present

## 2016-08-11 DIAGNOSIS — Z7982 Long term (current) use of aspirin: Secondary | ICD-10-CM | POA: Diagnosis not present

## 2016-08-11 DIAGNOSIS — Z9181 History of falling: Secondary | ICD-10-CM | POA: Diagnosis not present

## 2016-08-11 DIAGNOSIS — E079 Disorder of thyroid, unspecified: Secondary | ICD-10-CM | POA: Diagnosis not present

## 2016-08-11 DIAGNOSIS — I10 Essential (primary) hypertension: Secondary | ICD-10-CM | POA: Diagnosis not present

## 2016-08-11 DIAGNOSIS — Z8673 Personal history of transient ischemic attack (TIA), and cerebral infarction without residual deficits: Secondary | ICD-10-CM | POA: Diagnosis not present

## 2016-08-11 DIAGNOSIS — Z853 Personal history of malignant neoplasm of breast: Secondary | ICD-10-CM | POA: Diagnosis not present

## 2016-08-17 DIAGNOSIS — Z7982 Long term (current) use of aspirin: Secondary | ICD-10-CM | POA: Diagnosis not present

## 2016-08-17 DIAGNOSIS — Z853 Personal history of malignant neoplasm of breast: Secondary | ICD-10-CM | POA: Diagnosis not present

## 2016-08-17 DIAGNOSIS — E079 Disorder of thyroid, unspecified: Secondary | ICD-10-CM | POA: Diagnosis not present

## 2016-08-17 DIAGNOSIS — I10 Essential (primary) hypertension: Secondary | ICD-10-CM | POA: Diagnosis not present

## 2016-08-17 DIAGNOSIS — E538 Deficiency of other specified B group vitamins: Secondary | ICD-10-CM | POA: Diagnosis not present

## 2016-08-17 DIAGNOSIS — Z9181 History of falling: Secondary | ICD-10-CM | POA: Diagnosis not present

## 2016-08-17 DIAGNOSIS — Z8673 Personal history of transient ischemic attack (TIA), and cerebral infarction without residual deficits: Secondary | ICD-10-CM | POA: Diagnosis not present

## 2016-08-19 DIAGNOSIS — H401132 Primary open-angle glaucoma, bilateral, moderate stage: Secondary | ICD-10-CM | POA: Diagnosis not present

## 2016-08-19 DIAGNOSIS — H353131 Nonexudative age-related macular degeneration, bilateral, early dry stage: Secondary | ICD-10-CM | POA: Diagnosis not present

## 2016-08-19 DIAGNOSIS — Z961 Presence of intraocular lens: Secondary | ICD-10-CM | POA: Diagnosis not present

## 2016-08-19 DIAGNOSIS — H25812 Combined forms of age-related cataract, left eye: Secondary | ICD-10-CM | POA: Diagnosis not present

## 2016-08-26 DIAGNOSIS — E538 Deficiency of other specified B group vitamins: Secondary | ICD-10-CM | POA: Diagnosis not present

## 2016-08-26 DIAGNOSIS — I10 Essential (primary) hypertension: Secondary | ICD-10-CM | POA: Diagnosis not present

## 2016-08-26 DIAGNOSIS — Z8673 Personal history of transient ischemic attack (TIA), and cerebral infarction without residual deficits: Secondary | ICD-10-CM | POA: Diagnosis not present

## 2016-08-26 DIAGNOSIS — Z7982 Long term (current) use of aspirin: Secondary | ICD-10-CM | POA: Diagnosis not present

## 2016-08-26 DIAGNOSIS — Z853 Personal history of malignant neoplasm of breast: Secondary | ICD-10-CM | POA: Diagnosis not present

## 2016-08-26 DIAGNOSIS — E079 Disorder of thyroid, unspecified: Secondary | ICD-10-CM | POA: Diagnosis not present

## 2016-08-26 DIAGNOSIS — Z9181 History of falling: Secondary | ICD-10-CM | POA: Diagnosis not present

## 2016-09-07 ENCOUNTER — Inpatient Hospital Stay: Payer: Medicare Other

## 2016-09-07 ENCOUNTER — Inpatient Hospital Stay (HOSPITAL_BASED_OUTPATIENT_CLINIC_OR_DEPARTMENT_OTHER): Payer: Medicare Other | Admitting: Hematology and Oncology

## 2016-09-07 ENCOUNTER — Ambulatory Visit: Payer: Medicare Other | Admitting: Hematology and Oncology

## 2016-09-07 ENCOUNTER — Inpatient Hospital Stay: Payer: Medicare Other | Attending: Hematology and Oncology

## 2016-09-07 ENCOUNTER — Encounter: Payer: Self-pay | Admitting: Hematology and Oncology

## 2016-09-07 VITALS — BP 162/81 | HR 58 | Temp 97.2°F | Resp 18 | Wt 129.2 lb

## 2016-09-07 DIAGNOSIS — E876 Hypokalemia: Secondary | ICD-10-CM

## 2016-09-07 DIAGNOSIS — C3411 Malignant neoplasm of upper lobe, right bronchus or lung: Secondary | ICD-10-CM

## 2016-09-07 DIAGNOSIS — Z9011 Acquired absence of right breast and nipple: Secondary | ICD-10-CM

## 2016-09-07 DIAGNOSIS — Z923 Personal history of irradiation: Secondary | ICD-10-CM | POA: Diagnosis not present

## 2016-09-07 DIAGNOSIS — M8580 Other specified disorders of bone density and structure, unspecified site: Secondary | ICD-10-CM | POA: Insufficient documentation

## 2016-09-07 DIAGNOSIS — Z170 Estrogen receptor positive status [ER+]: Principal | ICD-10-CM

## 2016-09-07 DIAGNOSIS — N2889 Other specified disorders of kidney and ureter: Secondary | ICD-10-CM

## 2016-09-07 DIAGNOSIS — M8589 Other specified disorders of bone density and structure, multiple sites: Secondary | ICD-10-CM

## 2016-09-07 DIAGNOSIS — Z87891 Personal history of nicotine dependence: Secondary | ICD-10-CM | POA: Diagnosis not present

## 2016-09-07 DIAGNOSIS — Z79899 Other long term (current) drug therapy: Secondary | ICD-10-CM | POA: Insufficient documentation

## 2016-09-07 DIAGNOSIS — C50911 Malignant neoplasm of unspecified site of right female breast: Secondary | ICD-10-CM | POA: Insufficient documentation

## 2016-09-07 DIAGNOSIS — I10 Essential (primary) hypertension: Secondary | ICD-10-CM | POA: Diagnosis not present

## 2016-09-07 DIAGNOSIS — Z79811 Long term (current) use of aromatase inhibitors: Secondary | ICD-10-CM | POA: Insufficient documentation

## 2016-09-07 DIAGNOSIS — Z95828 Presence of other vascular implants and grafts: Secondary | ICD-10-CM

## 2016-09-07 DIAGNOSIS — Z9221 Personal history of antineoplastic chemotherapy: Secondary | ICD-10-CM | POA: Diagnosis not present

## 2016-09-07 LAB — COMPREHENSIVE METABOLIC PANEL
ALT: 11 U/L — ABNORMAL LOW (ref 14–54)
AST: 18 U/L (ref 15–41)
Albumin: 3.9 g/dL (ref 3.5–5.0)
Alkaline Phosphatase: 73 U/L (ref 38–126)
Anion gap: 4 — ABNORMAL LOW (ref 5–15)
BUN: 15 mg/dL (ref 6–20)
CO2: 29 mmol/L (ref 22–32)
Calcium: 9.5 mg/dL (ref 8.9–10.3)
Chloride: 107 mmol/L (ref 101–111)
Creatinine, Ser: 1 mg/dL (ref 0.44–1.00)
GFR calc Af Amer: 56 mL/min — ABNORMAL LOW (ref >60)
GFR calc non Af Amer: 48 mL/min — ABNORMAL LOW (ref >60)
Glucose, Bld: 131 mg/dL — ABNORMAL HIGH (ref 65–99)
Potassium: 3.3 mmol/L — ABNORMAL LOW (ref 3.5–5.1)
Sodium: 140 mmol/L (ref 135–145)
Total Bilirubin: 0.5 mg/dL (ref 0.3–1.2)
Total Protein: 7.3 g/dL (ref 6.5–8.1)

## 2016-09-07 LAB — CBC WITH DIFFERENTIAL/PLATELET
Basophils Absolute: 0 10*3/uL (ref 0–0.1)
Basophils Relative: 1 %
Eosinophils Absolute: 0 10*3/uL (ref 0–0.7)
Eosinophils Relative: 0 %
HCT: 40.1 % (ref 35.0–47.0)
Hemoglobin: 12.7 g/dL (ref 12.0–16.0)
Lymphocytes Relative: 24 %
Lymphs Abs: 1 10*3/uL (ref 1.0–3.6)
MCH: 24.9 pg — ABNORMAL LOW (ref 26.0–34.0)
MCHC: 31.8 g/dL — ABNORMAL LOW (ref 32.0–36.0)
MCV: 78.4 fL — ABNORMAL LOW (ref 80.0–100.0)
Monocytes Absolute: 0.5 10*3/uL (ref 0.2–0.9)
Monocytes Relative: 13 %
Neutro Abs: 2.6 10*3/uL (ref 1.4–6.5)
Neutrophils Relative %: 62 %
Platelets: 182 10*3/uL (ref 150–440)
RBC: 5.12 MIL/uL (ref 3.80–5.20)
RDW: 17.2 % — ABNORMAL HIGH (ref 11.5–14.5)
WBC: 4.1 10*3/uL (ref 3.6–11.0)

## 2016-09-07 MED ORDER — DENOSUMAB 60 MG/ML ~~LOC~~ SOLN
60.0000 mg | Freq: Once | SUBCUTANEOUS | Status: AC
  Administered 2016-09-07: 60 mg via SUBCUTANEOUS

## 2016-09-07 MED ORDER — HEPARIN SOD (PORK) LOCK FLUSH 100 UNIT/ML IV SOLN
500.0000 [IU] | Freq: Once | INTRAVENOUS | Status: AC
  Administered 2016-09-07: 500 [IU] via INTRAVENOUS

## 2016-09-07 MED ORDER — SODIUM CHLORIDE 0.9% FLUSH
10.0000 mL | INTRAVENOUS | Status: DC | PRN
  Administered 2016-09-07: 10 mL via INTRAVENOUS
  Filled 2016-09-07: qty 10

## 2016-09-07 MED ORDER — LIDOCAINE-PRILOCAINE 2.5-2.5 % EX CREA: 1.0000 "application " | TOPICAL_CREAM | CUTANEOUS | 0 refills | Status: DC | PRN

## 2016-09-07 NOTE — Patient Instructions (Signed)

## 2016-09-07 NOTE — Progress Notes (Signed)
[facility] day:  09/07/16  Chief Complaint: Alexandra Glass is a 83 y.o. female with a history of stage II breast cancer and stage IIIB lung cancer who is seen for 3 month assessment.  HPI: The patient was last seen in the medical oncology clinic on 06/15/2016.  At that time, she was doing well on Femara.  Exam was stable.  She was awaiting dental clearance for Prolia.  During the interim, has done well.  She is eating and gaining weight.  She states that she cooks for herself.  She is also getting prepared foods to heat up. She denies any breast concerns.  She denies any pain.  She has declined BCI testing secondary to cost considerations and her age.  She is interested in starting Prolia.  She has received dental clearance from Dr Loney Laurence.   Past Medical History:  Diagnosis Date  . Breast cancer ([place])   . Hypertension   . Renal cancer ([place])   . Thyroid disease     Past Surgical History:  Procedure Laterality Date  . APPENDECTOMY    . BREAST BIOPSY Left    neg  . BREAST SURGERY Right   . MASTECTOMY Right 02/04/11   chemo/rad- takes femara  . tumor surg     removed from L) side  . VAGINAL HYSTERECTOMY      Family History  Problem Relation Age of Onset  . Cancer Sister        lung  . Cancer Sister        pancreas  . Cancer Sister        breast  . Breast cancer Neg Hx     Social History:  reports that she quit smoking about 42 years ago. Her smoking use included Cigarettes. She has never used smokeless tobacco. She reports that she does not drink alcohol or use drugs.  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  She lives in [place].  Her niece lives behind her and helps with her medications.  The patient is accompanied by her son, [name], and her daughter-in-law, Apolonio Schneiders, today.  Allergies:  Allergies  Allergen Reactions  . No Known Allergies     Current Medications: Current Outpatient Prescriptions  Medication Sig Dispense Refill  .  aspirin 325 MG tablet Take 1 tablet (325 mg total) by mouth daily. 90 tablet 0  . atorvastatin (LIPITOR) 20 MG tablet Take 1 tablet (20 mg total) by mouth daily. 30 tablet 6  . donepezil (ARICEPT) 10 MG tablet Take 10 mg by mouth at bedtime. Potter    . hydrochlorothiazide (HYDRODIURIL) 12.5 MG tablet Take 1 tablet (12.5 mg total) by mouth daily. 30 tablet 6  . latanoprost (XALATAN) 0.005 % ophthalmic solution     . letrozole (FEMARA) 2.5 MG tablet TAKE (1) TABLET BY MOUTH EVERY DAY 30 tablet 3  . levothyroxine (SYNTHROID, LEVOTHROID) 75 MCG tablet Take 1 tablet (75 mcg total) by mouth daily. 30 tablet 1  . memantine (NAMENDA) 5 MG tablet Take 5 mg by mouth 2 (two) times daily. Dr Melrose Nakayama    . potassium chloride (K-DUR) 10 MEQ tablet Take 1 tablet (10 mEq total) by mouth daily. 30 tablet 6  . vitamin B-12 (CYANOCOBALAMIN) 1000 MCG tablet Take 1,000 mcg by mouth daily.    Marland Kitchen lidocaine-prilocaine (EMLA) cream Apply 1 application topically as needed. 30 g 0   No current facility-administered medications for this visit.    Facility-Administered Medications Ordered in Other Visits  Medication Dose Route Frequency Provider Last Rate Last Dose  . sodium chloride flush (NS) 0.9 % injection 10 mL  10 mL Intravenous PRN Lequita Asal, MD   10 mL at 09/07/16 1120    Review of Systems:  GENERAL:  Feels "pretty good".  No fevers or sweats.  Weight up 2 pounds. PERFORMANCE STATUS (ECOG):  2 HEENT: No visual changes, runny nose, sore throat, mouth sores or tenderness.  Awaiting dental clearance. Lungs:  No shortness of breath or cough.  No hemoptysis. Cardiac:  No chest pain, palpitations, orthopnea, or PND. GI:  Eating "as much as I can".  (see HPI).  No nausea, vomiting, diarrhea, constipation, melena or hematochezia. GU:  No urgency, frequency, dysuria, or hematuria. Musculoskeletal:  No back pain.  No joint pain.  No muscle tenderness. Extremities: No pain or swelling. Skin:  No rashes or skin  changes. Neuro:  Dementia.  No headache, numbness or weakness, balance or coordination issues. Endocrine:  No diabetes.  Thyroid disease on Synthroid.  No hot flashes or night sweats. Psych:  No mood changes, depression or anxiety. Pain:  No focal pain. Review of systems:  All other systems reviewed and found to be negative.  Physical Exam: Blood pressure (!) 162/81, pulse (!) 58, temperature 97.2 F (36.2 C), temperature source Tympanic, resp. rate 18, weight 129 lb 3 oz (58.6 kg). GENERAL:  Thin elderly woman with marked vitiligo sitting comfortably in the exam room in no acute distress. MENTAL STATUS:  Alert and oriented to person, place and time. HEAD:  Graying hair pulled back.  Normocephalic, atraumatic, face symmetric, no Cushingoid features. EYES:  Glasses.  Brown eyes.  Pupils equal round and reactive to light and accomodation.  No conjunctivitis or scleral icterus. ENT:  Oropharynx clear without lesion.  Dentures.  Tongue normal. Mucous membranes moist.  RESPIRATORY:  Clear to auscultation without rales, wheezes or rhonchi. CARDIOVASCULAR:  Regular rate and rhythm without murmur, rub or gallop. CHEST WALL:  Port-a-cath. BREASTS:  Right sided mastectomy well healed.  No skin changes, erythema or nodularity.  Left breast without masses, skin changes or nipple discharge. ABDOMEN:  Soft, non-tender, with active bowel sounds, and no hepatosplenomegaly.  No masses. SKIN:  Vitiligo involving face, chest, and hands.  No rashes, ulcers or lesions. EXTREMITIES: Right arm lymphedema.  No lower extremity edema, no skin discoloration or tenderness.  No palpable cords. LYMPH NODES: No palpable cervical, supraclavicular, axillary or inguinal adenopathy  NEUROLOGICAL: Unremarkable. PSYCH:  Appropriate.   Imaging studies: 01/27/2014:  Chest, abdomen, and pelvic CT revealed stable RUL pulmonary nodule, 12 x 7 mm ground glass attenuation of the LUL nodule, and multiple bilateral aggressive  appearing renal lesions.  The largest was 4.5 x 3.7 cm in the left kidney.  Lesions were highly suspicious for renal cell carcinoma.  Given her age and bilateral disease, a decision by tumor board was for ongoing surveillance. 12/05/2014:  Chest, abdomen, and pelvic CT revealed mild progression of the 2.7 cm spiculated medial right upper lobe pulmonary nodule. There was a stable 1.1 cm subsolid left upper lobe pulmonary nodule. There was mild progression of subcarinal mediastinal adenopathy. There were 4 heterogeneous enhancing solid renal masses (1 on the right and 3 on the left kidney) which were slightly enlarged and all suspicious for renal cell carcinoma. There was mild progression of left periaortic adenopathy. She had a 2.6 cm infrarenal abdominal aortic aneurysm. 06/09/2015:  Chest, abdomen, and pelvic CT revealed a mild interval decrease size (2.4  x 1.7 cm) of medial right upper lobe pulmonary nodule.   There has been interval stability (1.1 x 0.8 cm) of previously described subsolid left upper lobe pulmonary nodule.   The pure ground-glass apical left upper lobe pulmonary nodule had mildly increased in size (1.4 x 1.2 cm).  There was stable mild subcarinal mediastinal and left para-aortic retroperitoneal lymphadenopathy.  There was stable bilateral indeterminate renal masses, suspicious for renal cell carcinoma based on prior contrast-enhanced studies. 12/07/2015:  Chest, abdomen, and pelvic CT evealed several stable masses in the left kidney.  There was some mild left periaortic adenopathy (stable).  There was stable volume loss and paramediastinal density in the right suprahilar region.  There was sub solid nodularity in the left upper lobe (reduced to stable).  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  06/08/2016:  Chest, abdomen, and pelvic CT revealed a stable exam with no new or progressive findings.  There were stable multiple left renal  lesions.  There was stable mild left para-aortic lymphadenopathy, some of which were mineralized.  There was stable appearance of volume loss/consolidation medial right upper low, likely radiation related.  There was sub solid anterior left apical nodule and adjacent spiculated opacity with slight interval decrease in size since prior  Study.  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   Appointment on 09/07/2016  Component Date Value Ref Range Status  . WBC 09/07/2016 4.1  3.6 - 11.0 K/uL Final  . RBC 09/07/2016 5.12  3.80 - 5.20 MIL/uL Final  . Hemoglobin 09/07/2016 12.7  12.0 - 16.0 g/dL Final  . HCT 09/07/2016 40.1  35.0 - 47.0 % Final  . MCV 09/07/2016 78.4* 80.0 - 100.0 fL Final  . MCH 09/07/2016 24.9* 26.0 - 34.0 pg Final  . MCHC 09/07/2016 31.8* 32.0 - 36.0 g/dL Final  . RDW 09/07/2016 17.2* 11.5 - 14.5 % Final  . Platelets 09/07/2016 182  150 - 440 K/uL Final  . Neutrophils Relative % 09/07/2016 62  % Final  . Neutro Abs 09/07/2016 2.6  1.4 - 6.5 K/uL Final  . Lymphocytes Relative 09/07/2016 24  % Final  . Lymphs Abs 09/07/2016 1.0  1.0 - 3.6 K/uL Final  . Monocytes Relative 09/07/2016 13  % Final  . Monocytes Absolute 09/07/2016 0.5  0.2 - 0.9 K/uL Final  . Eosinophils Relative 09/07/2016 0  % Final  . Eosinophils Absolute 09/07/2016 0.0  0 - 0.7 K/uL Final  . Basophils Relative 09/07/2016 1  % Final  . Basophils Absolute 09/07/2016 0.0  0 - 0.1 K/uL Final  . Sodium 09/07/2016 140  135 - 145 mmol/L Final  . Potassium 09/07/2016 3.3* 3.5 - 5.1 mmol/L Final  . Chloride 09/07/2016 107  101 - 111 mmol/L Final  . CO2 09/07/2016 29  22 - 32 mmol/L Final  . Glucose, Bld 09/07/2016 131* 65 - 99 mg/dL Final  . BUN 09/07/2016 15  6 - 20 mg/dL Final  . Creatinine, Ser 09/07/2016 1.00  0.44 - 1.00 mg/dL Final  . Calcium 09/07/2016 9.5  8.9 - 10.3 mg/dL Final  . Total Protein 09/07/2016 7.3  6.5 - 8.1 g/dL Final  . Albumin 09/07/2016 3.9  3.5 - 5.0 g/dL Final   . AST 09/07/2016 18  15 - 41 U/L Final  . ALT 09/07/2016 11* 14 - 54 U/L Final  . Alkaline Phosphatase 09/07/2016 73  38 - 126 U/L Final  .  Total Bilirubin 09/07/2016 0.5  0.3 - 1.2 mg/dL Final  . GFR calc non Af Amer 09/07/2016 48* >60 mL/min Final  . GFR calc Af Amer 09/07/2016 56* >60 mL/min Final   Comment: (NOTE) The eGFR has been calculated using the CKD EPI equation. This calculation has not been validated in all clinical situations. eGFR's persistently <60 mL/min signify possible Chronic Kidney Disease.   . Anion gap 09/07/2016 4* 5 - 15 Final    Assessment:  Alexandra Glass is a 83 y.o. female with a history of stage II right breast cancer and stage IIIB right-sided lung cancer.  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   She underwent a right modified radical mastectomy and node dissection on 02/04/2011. Pathology revealed grade II multifocal disease (largest lesion 3.6 cm).  There was angiolymphatic invasion. Nine lymph nodes were negative. She received radiation to the chest wall and lymphatics from 04/25/2011 until 06/15/2011.  She is on Femara.    Mammogram on 11/23/2015 revealed no evidence of malignancy.    CA27.29 has been followed: 29.6 on 04/07/2014, 21.6 on 08/06/2014, 28.9 on 11/12/2014, 13.7 on 06/10/2015, 20.9 on 12/09/2015, and 24.2 on 06/15/2016.  Bone density study on 03/19/2014 revealed osteopenia with a T score of -1.9 in L1-L4 and -1.6 in the right femur.  Bone density study on 03/21/2016 revealed osteopenia with a T score of -2.2 in the right femoral neck and -1.8 in the AP spine L1-L4.  She was diagnosed with stage IIIB right lung cancer in 2014.  Pathology revealed differentiated adenocarcinoma consistent with lung primary. EGFR and ALK were negative. She received 6 weeks of concurrent carboplatin and Taxol from 08/08/2012 until 10/08/2012.  CEA was 2.5 on 11/12/2014 and 2.1 on 06/10/2015.  Chest, abdomen, and pelvic CT on 01/27/2014 revealed stable RUL  pulmonary nodule, 12 x 7 mm ground glass attenuation of the LUL nodule, and multiple bilateral aggressive appearing renal lesions.  The largest was 4.5 x 3.7 cm in the left kidney.  Lesions were highly suspicious for renal cell carcinoma.  Given her age and bilateral disease, a decision by tumor board was for ongoing surveillance.  Chest, abdomen, and pelvic CT on 06/08/2016 revealed a stable exam with no new or progressive findings.  She is not interested in a biopsy or treatment for the enlarging renal masses.  Symptomatically, she feels good.  She is gaining weight.  Exam is stable. Potassium is low (3.3).  Plan: 1.  Labs today:  CBC with diff, CMP. 2.  Discuss patient's decline of BCI testing secondary to costs.  Discuss original pathology.  Unclear benefit of 5 versus 10 years of hormonal therapy.  Patient wishes to continue.  Discuss side effects of ongoing Femara with decreased bone density.  Patient wishes to proceed with Prolia every 6 months.  Side effects reviewed.  Patient received dental clearance.  Discuss small risk of osteonecrosis of the jaw.  Discuss renal issues and risk of hypocalcemia.  Signs and symptoms reviewed.  Discuss calcium supplementation 1200 mg a day with vitamin D 800 units.  Discuss checking calcium in 1-2 weeks and in 3 months. 3.  Discuss renal lesions.  No intervention at this time per patient. 4.  Discuss h/o lung cancer.  Anticipate follow-up imaging later this year. 5.  Discuss hypokalemia secondary to HCTZ.  She is currently taking potassium chloride 10 meq a day.  Increase potassium to 10 meq BID x 3 days then back to 10 meq a day. 6.  [address]  flush every 6-8 weeks. 7.  Prolia today. 8.  Schedule left mammogram 11/16/2016. 9.  Port flush every 6-8 weeks. 10.  RTC in 1-2 weeks and 3 months for BMP. 11.  RTC in 6 months for MD assessment, labs (CBC with diff, CMP, CA27.29), and Prolia    Lequita Asal, MD  09/07/2016, 12:07 PM

## 2016-09-07 NOTE — Progress Notes (Signed)
Patient offers no concerns today. 

## 2016-09-14 ENCOUNTER — Other Ambulatory Visit: Payer: Self-pay | Admitting: Family Medicine

## 2016-09-21 ENCOUNTER — Other Ambulatory Visit: Payer: Medicare Other

## 2016-09-23 ENCOUNTER — Inpatient Hospital Stay: Payer: Medicare Other | Attending: Hematology and Oncology

## 2016-09-23 ENCOUNTER — Inpatient Hospital Stay: Payer: Medicare Other

## 2016-09-23 DIAGNOSIS — Z9011 Acquired absence of right breast and nipple: Secondary | ICD-10-CM | POA: Diagnosis not present

## 2016-09-23 DIAGNOSIS — Z170 Estrogen receptor positive status [ER+]: Secondary | ICD-10-CM | POA: Diagnosis not present

## 2016-09-23 DIAGNOSIS — Z79811 Long term (current) use of aromatase inhibitors: Secondary | ICD-10-CM | POA: Diagnosis not present

## 2016-09-23 DIAGNOSIS — Z9221 Personal history of antineoplastic chemotherapy: Secondary | ICD-10-CM | POA: Insufficient documentation

## 2016-09-23 DIAGNOSIS — Z853 Personal history of malignant neoplasm of breast: Secondary | ICD-10-CM | POA: Diagnosis not present

## 2016-09-23 DIAGNOSIS — C3411 Malignant neoplasm of upper lobe, right bronchus or lung: Secondary | ICD-10-CM

## 2016-09-23 DIAGNOSIS — C50911 Malignant neoplasm of unspecified site of right female breast: Secondary | ICD-10-CM | POA: Diagnosis not present

## 2016-09-23 DIAGNOSIS — C3491 Malignant neoplasm of unspecified part of right bronchus or lung: Secondary | ICD-10-CM | POA: Diagnosis not present

## 2016-09-23 DIAGNOSIS — M8589 Other specified disorders of bone density and structure, multiple sites: Secondary | ICD-10-CM

## 2016-09-23 DIAGNOSIS — M8580 Other specified disorders of bone density and structure, unspecified site: Secondary | ICD-10-CM | POA: Insufficient documentation

## 2016-09-23 DIAGNOSIS — N2889 Other specified disorders of kidney and ureter: Secondary | ICD-10-CM

## 2016-09-23 DIAGNOSIS — Z95828 Presence of other vascular implants and grafts: Secondary | ICD-10-CM

## 2016-09-23 LAB — BASIC METABOLIC PANEL
Anion gap: 6 (ref 5–15)
BUN: 19 mg/dL (ref 6–20)
CO2: 27 mmol/L (ref 22–32)
Calcium: 9 mg/dL (ref 8.9–10.3)
Chloride: 108 mmol/L (ref 101–111)
Creatinine, Ser: 1.04 mg/dL — ABNORMAL HIGH (ref 0.44–1.00)
GFR calc Af Amer: 53 mL/min — ABNORMAL LOW (ref >60)
GFR calc non Af Amer: 46 mL/min — ABNORMAL LOW (ref >60)
Glucose, Bld: 131 mg/dL — ABNORMAL HIGH (ref 65–99)
Potassium: 3.6 mmol/L (ref 3.5–5.1)
Sodium: 141 mmol/L (ref 135–145)

## 2016-09-23 MED ORDER — SODIUM CHLORIDE 0.9% FLUSH
10.0000 mL | INTRAVENOUS | Status: DC | PRN
  Administered 2016-09-23: 10 mL via INTRAVENOUS
  Filled 2016-09-23: qty 10

## 2016-09-23 MED ORDER — HEPARIN SOD (PORK) LOCK FLUSH 100 UNIT/ML IV SOLN
500.0000 [IU] | Freq: Once | INTRAVENOUS | Status: AC
  Administered 2016-09-23: 500 [IU] via INTRAVENOUS

## 2016-10-13 ENCOUNTER — Other Ambulatory Visit: Payer: Self-pay | Admitting: Family Medicine

## 2016-10-14 ENCOUNTER — Encounter: Payer: Self-pay | Admitting: Family Medicine

## 2016-10-14 ENCOUNTER — Ambulatory Visit (INDEPENDENT_AMBULATORY_CARE_PROVIDER_SITE_OTHER): Payer: Medicare Other | Admitting: Family Medicine

## 2016-10-14 VITALS — BP 132/86 | HR 60 | Resp 16 | Ht 67.0 in | Wt 134.0 lb

## 2016-10-14 DIAGNOSIS — E876 Hypokalemia: Secondary | ICD-10-CM | POA: Diagnosis not present

## 2016-10-14 DIAGNOSIS — E782 Mixed hyperlipidemia: Secondary | ICD-10-CM

## 2016-10-14 DIAGNOSIS — I10 Essential (primary) hypertension: Secondary | ICD-10-CM

## 2016-10-14 DIAGNOSIS — E039 Hypothyroidism, unspecified: Secondary | ICD-10-CM | POA: Diagnosis not present

## 2016-10-14 DIAGNOSIS — E786 Lipoprotein deficiency: Secondary | ICD-10-CM | POA: Diagnosis not present

## 2016-10-14 DIAGNOSIS — Z23 Encounter for immunization: Secondary | ICD-10-CM | POA: Diagnosis not present

## 2016-10-14 MED ORDER — POTASSIUM CHLORIDE ER 10 MEQ PO TBCR: 10.0000 meq | EXTENDED_RELEASE_TABLET | Freq: Every day | ORAL | 6 refills | Status: DC

## 2016-10-14 MED ORDER — HYDROCHLOROTHIAZIDE 12.5 MG PO TABS: 12.5000 mg | ORAL_TABLET | Freq: Every day | ORAL | 6 refills | Status: DC

## 2016-10-14 MED ORDER — LEVOTHYROXINE SODIUM 75 MCG PO TABS: ORAL_TABLET | ORAL | 5 refills | Status: DC

## 2016-10-14 MED ORDER — ATORVASTATIN CALCIUM 20 MG PO TABS: 20.0000 mg | ORAL_TABLET | Freq: Every day | ORAL | 6 refills | Status: DC

## 2016-10-14 NOTE — Progress Notes (Addendum)
Name: Alexandra Glass   MRN: [id number]    DOB: 1924-05-02   Date:10/15/2016       Progress Note  Subjective  Chief Complaint  Chief Complaint  Patient presents with  . Thyroid Problem    needs refill  . Hyperlipidemia    refill    Thyroid Problem  Presents for follow-up visit. Patient reports no anxiety, cold intolerance, constipation, depressed mood, diaphoresis, diarrhea, dry skin, fatigue, hair loss, heat intolerance, hoarse voice, leg swelling, menstrual problem, nail problem, palpitations, tremors, visual change, weight gain or weight loss. The symptoms have been stable. Her past medical history is significant for hyperlipidemia. There is no history of diabetes or heart failure.  Hyperlipidemia  This is a chronic problem. The current episode started more than 1 year ago. The problem is controlled. Recent lipid tests were reviewed and are normal. She has no history of chronic renal disease, diabetes, hypothyroidism, liver disease, obesity or nephrotic syndrome. Pertinent negatives include no chest pain, focal sensory loss, focal weakness, leg pain, myalgias or shortness of breath. Current antihyperlipidemic treatment includes statins. The current treatment provides mild improvement of lipids. There are no compliance problems.  Risk factors for coronary artery disease include dyslipidemia.  Hypertension  This is a chronic problem. The current episode started more than 1 year ago. The problem is unchanged. The problem is controlled. Pertinent negatives include no blurred vision, chest pain, headaches, malaise/fatigue, neck pain, palpitations or shortness of breath. Past treatments include diuretics. The current treatment provides moderate improvement. There are no compliance problems.  There is no history of angina, kidney disease, CAD/MI, CVA, heart failure, left ventricular hypertrophy, PVD or retinopathy. Identifiable causes of hypertension include a thyroid problem. There is no history of chronic  renal disease, a hypertension causing med or renovascular disease.    No problem-specific Assessment & Plan notes found for this encounter.   Past Medical History:  Diagnosis Date  . Breast cancer ([place])   . Hypertension   . Renal cancer ([place])   . Thyroid disease     Past Surgical History:  Procedure Laterality Date  . APPENDECTOMY    . BREAST BIOPSY Left    neg  . BREAST SURGERY Right   . MASTECTOMY Right 02/04/11   chemo/rad- takes femara  . tumor surg     removed from L) side  . VAGINAL HYSTERECTOMY      Family History  Problem Relation Age of Onset  . Cancer Sister        lung  . Cancer Sister        pancreas  . Cancer Sister        breast  . Breast cancer Neg Hx     Social History   Social History  . Marital status: Single    Spouse name: N/A  . Number of children: N/A  . Years of education: N/A   Occupational History  . Not on file.   Social History Main Topics  . Smoking status: Former Smoker    Types: Cigarettes    Quit date: 12/17/1973  . Smokeless tobacco: Never Used  . Alcohol use No  . Drug use: No  . Sexual activity: No   Other Topics Concern  . Not on file   Social History Narrative  . No narrative on file    Allergies  Allergen Reactions  . No Known Allergies     Outpatient Medications Prior to Visit  Medication Sig Dispense Refill  . aspirin 325 MG tablet  Take 1 tablet (325 mg total) by mouth daily. 90 tablet 0  . donepezil (ARICEPT) 10 MG tablet Take 10 mg by mouth at bedtime. Potter    . latanoprost (XALATAN) 0.005 % ophthalmic solution     . letrozole (FEMARA) 2.5 MG tablet TAKE (1) TABLET BY MOUTH EVERY DAY 30 tablet 3  . lidocaine-prilocaine (EMLA) cream Apply 1 application topically as needed. 30 g 0  . vitamin B-12 (CYANOCOBALAMIN) 1000 MCG tablet Take 1,000 mcg by mouth daily.    Marland Kitchen atorvastatin (LIPITOR) 20 MG tablet Take 1 tablet (20 mg total) by mouth daily. 30 tablet 6  . hydrochlorothiazide (HYDRODIURIL) 12.5 MG  tablet Take 1 tablet (12.5 mg total) by mouth daily. 30 tablet 6  . levothyroxine (SYNTHROID, LEVOTHROID) 75 MCG tablet TAKE (1) TABLET BY MOUTH EVERY DAY **DISCONTINUE 88 MCG** 30 tablet 0  . potassium chloride (K-DUR) 10 MEQ tablet Take 1 tablet (10 mEq total) by mouth daily. 30 tablet 6  . memantine (NAMENDA) 5 MG tablet Take 5 mg by mouth 2 (two) times daily. Dr Melrose Nakayama     No facility-administered medications prior to visit.     Review of Systems  Constitutional: Negative for chills, diaphoresis, fatigue, fever, malaise/fatigue, weight gain and weight loss.  HENT: Negative for ear discharge, ear pain, hoarse voice and sore throat.   Eyes: Negative for blurred vision.  Respiratory: Negative for cough, sputum production, shortness of breath and wheezing.   Cardiovascular: Negative for chest pain, palpitations and leg swelling.  Gastrointestinal: Negative for abdominal pain, blood in stool, constipation, diarrhea, heartburn, melena and nausea.  Genitourinary: Negative for dysuria, frequency, hematuria, menstrual problem and urgency.  Musculoskeletal: Negative for back pain, joint pain, myalgias and neck pain.  Skin: Negative for rash.  Neurological: Negative for dizziness, tingling, tremors, sensory change, focal weakness and headaches.  Endo/Heme/Allergies: Negative for environmental allergies, cold intolerance, heat intolerance and polydipsia. Does not bruise/bleed easily.  Psychiatric/Behavioral: Negative for depression and suicidal ideas. The patient is not nervous/anxious and does not have insomnia.      Objective  Vitals:   10/14/16 0848  BP: 132/86  Pulse: 60  Resp: 16  SpO2: 98%  Weight: 134 lb (60.8 kg)  Height: 5\' 7"  (1.702 m)    Physical Exam  Constitutional: She is well-developed, well-nourished, and in no distress. No distress.  HENT:  Head: Normocephalic and atraumatic.  Right Ear: External ear normal.  Left Ear: External ear normal.  Nose: Nose normal.   Mouth/Throat: Oropharynx is clear and moist.  Eyes: Conjunctivae and EOM are normal. Pupils are equal, round, and reactive to light. Right eye exhibits no discharge. Left eye exhibits no discharge.  Neck: Normal range of motion. Neck supple. No JVD present. No thyromegaly present.  Cardiovascular: Normal rate, regular rhythm, normal heart sounds and intact distal pulses.  Exam reveals no gallop and no friction rub.   No murmur heard. Pulmonary/Chest: Effort normal and breath sounds normal. She has no wheezes. She has no rales.  Abdominal: Soft. Bowel sounds are normal. She exhibits no mass. There is no tenderness. There is no guarding.  Musculoskeletal: Normal range of motion. She exhibits no edema.  Lymphadenopathy:    She has no cervical adenopathy.  Neurological: She is alert. She has normal reflexes.  Skin: Skin is warm and dry. She is not diaphoretic.  Psychiatric: Mood and affect normal.  Nursing note and vitals reviewed.     Assessment & Plan  Problem List Items Addressed This Visit  Cardiovascular and Mediastinum   Essential hypertension - Primary   Relevant Medications   hydrochlorothiazide (HYDRODIURIL) 12.5 MG tablet   atorvastatin (LIPITOR) 20 MG tablet   Other Relevant Orders   Renal function panel (Completed)     Other   Hyperlipidemia   Relevant Medications   hydrochlorothiazide (HYDRODIURIL) 12.5 MG tablet   atorvastatin (LIPITOR) 20 MG tablet   Other Relevant Orders   Lipid Profile (Completed)   Hypokalemia   Relevant Medications   potassium chloride (K-DUR) 10 MEQ tablet   Other Relevant Orders   Renal function panel (Completed)    Other Visit Diagnoses    Adult hypothyroidism       Relevant Medications   levothyroxine (SYNTHROID, LEVOTHROID) 75 MCG tablet   Other Relevant Orders   TSH (Completed)   Need for vaccination with 13-polyvalent pneumococcal conjugate vaccine       Relevant Orders   Pneumococcal conjugate vaccine 13-valent IM  (Completed)      Meds ordered this encounter  Medications  . hydrochlorothiazide (HYDRODIURIL) 12.5 MG tablet    Sig: Take 1 tablet (12.5 mg total) by mouth daily.    Dispense:  30 tablet    Refill:  6  . atorvastatin (LIPITOR) 20 MG tablet    Sig: Take 1 tablet (20 mg total) by mouth daily.    Dispense:  30 tablet    Refill:  6  . levothyroxine (SYNTHROID, LEVOTHROID) 75 MCG tablet    Sig: TAKE (1) TABLET BY MOUTH EVERY DAY    Dispense:  30 tablet    Refill:  5  . potassium chloride (K-DUR) 10 MEQ tablet    Sig: Take 1 tablet (10 mEq total) by mouth daily.    Dispense:  30 tablet    Refill:  6      Dr. Otilio Miu [facility]  10/15/16

## 2016-10-15 LAB — RENAL FUNCTION PANEL
Albumin: 4.4 g/dL (ref 3.2–4.6)
BUN / CREAT RATIO: 18 (ref 12–28)
BUN: 17 mg/dL (ref 10–36)
CO2: 22 mmol/L (ref 20–29)
CREATININE: 0.94 mg/dL (ref 0.57–1.00)
Calcium: 9.7 mg/dL (ref 8.7–10.3)
Chloride: 110 mmol/L — ABNORMAL HIGH (ref 96–106)
GFR, EST AFRICAN AMERICAN: 62 mL/min/{1.73_m2} (ref >59)
GFR, EST NON AFRICAN AMERICAN: 54 mL/min/{1.73_m2} — AB (ref >59)
GLUCOSE: 91 mg/dL (ref 65–99)
POTASSIUM: 4.3 mmol/L (ref 3.5–5.2)
Phosphorus: 3.8 mg/dL (ref 2.5–4.5)
SODIUM: 147 mmol/L — AB (ref 134–144)

## 2016-10-15 LAB — LIPID PANEL
CHOL/HDL RATIO: 2.2 ratio (ref 0.0–4.4)
Cholesterol, Total: 158 mg/dL (ref 100–199)
HDL: 73 mg/dL (ref >39)
LDL CALC: 74 mg/dL (ref 0–99)
Triglycerides: 55 mg/dL (ref 0–149)
VLDL Cholesterol Cal: 11 mg/dL (ref 5–40)

## 2016-10-15 LAB — TSH: TSH: 1.34 u[IU]/mL (ref 0.450–4.500)

## 2016-10-17 DIAGNOSIS — M79671 Pain in right foot: Secondary | ICD-10-CM | POA: Diagnosis not present

## 2016-10-17 DIAGNOSIS — B351 Tinea unguium: Secondary | ICD-10-CM | POA: Diagnosis not present

## 2016-10-17 DIAGNOSIS — M79672 Pain in left foot: Secondary | ICD-10-CM | POA: Diagnosis not present

## 2016-11-09 ENCOUNTER — Inpatient Hospital Stay: Payer: Medicare Other | Attending: Hematology and Oncology

## 2016-11-09 DIAGNOSIS — Z9011 Acquired absence of right breast and nipple: Secondary | ICD-10-CM | POA: Diagnosis not present

## 2016-11-09 DIAGNOSIS — Z452 Encounter for adjustment and management of vascular access device: Secondary | ICD-10-CM | POA: Insufficient documentation

## 2016-11-09 DIAGNOSIS — Z853 Personal history of malignant neoplasm of breast: Secondary | ICD-10-CM | POA: Insufficient documentation

## 2016-11-09 DIAGNOSIS — Z79811 Long term (current) use of aromatase inhibitors: Secondary | ICD-10-CM | POA: Insufficient documentation

## 2016-11-09 DIAGNOSIS — Z9221 Personal history of antineoplastic chemotherapy: Secondary | ICD-10-CM | POA: Diagnosis not present

## 2016-11-09 DIAGNOSIS — C3491 Malignant neoplasm of unspecified part of right bronchus or lung: Secondary | ICD-10-CM | POA: Insufficient documentation

## 2016-11-09 DIAGNOSIS — Z170 Estrogen receptor positive status [ER+]: Secondary | ICD-10-CM | POA: Diagnosis not present

## 2016-11-09 DIAGNOSIS — M8580 Other specified disorders of bone density and structure, unspecified site: Secondary | ICD-10-CM | POA: Insufficient documentation

## 2016-11-09 DIAGNOSIS — C50911 Malignant neoplasm of unspecified site of right female breast: Secondary | ICD-10-CM | POA: Diagnosis not present

## 2016-11-09 DIAGNOSIS — Z95828 Presence of other vascular implants and grafts: Secondary | ICD-10-CM

## 2016-11-09 MED ORDER — SODIUM CHLORIDE 0.9% FLUSH
10.0000 mL | INTRAVENOUS | Status: DC | PRN
  Administered 2016-11-09: 10 mL via INTRAVENOUS
  Filled 2016-11-09: qty 10

## 2016-11-09 MED ORDER — HEPARIN SOD (PORK) LOCK FLUSH 100 UNIT/ML IV SOLN
500.0000 [IU] | Freq: Once | INTRAVENOUS | Status: AC
  Administered 2016-11-09: 500 [IU] via INTRAVENOUS

## 2016-11-23 ENCOUNTER — Ambulatory Visit
Admission: RE | Admit: 2016-11-23 | Discharge: 2016-11-23 | Disposition: A | Discharge status: Home / Self Care | Payer: Medicare Other | Source: Ambulatory Visit | Attending: Hematology and Oncology | Admitting: Hematology and Oncology

## 2016-11-23 DIAGNOSIS — C3411 Malignant neoplasm of upper lobe, right bronchus or lung: Secondary | ICD-10-CM

## 2016-11-23 DIAGNOSIS — Z1231 Encounter for screening mammogram for malignant neoplasm of breast: Secondary | ICD-10-CM | POA: Insufficient documentation

## 2016-11-23 DIAGNOSIS — N2889 Other specified disorders of kidney and ureter: Secondary | ICD-10-CM

## 2016-11-23 DIAGNOSIS — Z170 Estrogen receptor positive status [ER+]: Secondary | ICD-10-CM

## 2016-11-23 DIAGNOSIS — C50911 Malignant neoplasm of unspecified site of right female breast: Secondary | ICD-10-CM

## 2016-11-23 DIAGNOSIS — M8589 Other specified disorders of bone density and structure, multiple sites: Secondary | ICD-10-CM

## 2016-11-23 HISTORY — DX: Personal history of antineoplastic chemotherapy: Z92.21

## 2016-11-23 HISTORY — DX: Personal history of irradiation: Z92.3

## 2016-12-07 ENCOUNTER — Other Ambulatory Visit: Payer: Self-pay | Admitting: *Deleted

## 2016-12-07 ENCOUNTER — Inpatient Hospital Stay: Payer: Medicare Other | Attending: Hematology and Oncology

## 2016-12-07 DIAGNOSIS — Z9221 Personal history of antineoplastic chemotherapy: Secondary | ICD-10-CM | POA: Insufficient documentation

## 2016-12-07 DIAGNOSIS — C3491 Malignant neoplasm of unspecified part of right bronchus or lung: Secondary | ICD-10-CM | POA: Insufficient documentation

## 2016-12-07 DIAGNOSIS — C3411 Malignant neoplasm of upper lobe, right bronchus or lung: Secondary | ICD-10-CM

## 2016-12-07 DIAGNOSIS — Z9011 Acquired absence of right breast and nipple: Secondary | ICD-10-CM | POA: Insufficient documentation

## 2016-12-07 DIAGNOSIS — C50911 Malignant neoplasm of unspecified site of right female breast: Secondary | ICD-10-CM | POA: Insufficient documentation

## 2016-12-07 DIAGNOSIS — M8580 Other specified disorders of bone density and structure, unspecified site: Secondary | ICD-10-CM | POA: Diagnosis not present

## 2016-12-07 DIAGNOSIS — N2889 Other specified disorders of kidney and ureter: Secondary | ICD-10-CM

## 2016-12-07 DIAGNOSIS — Z853 Personal history of malignant neoplasm of breast: Secondary | ICD-10-CM | POA: Insufficient documentation

## 2016-12-07 DIAGNOSIS — Z170 Estrogen receptor positive status [ER+]: Secondary | ICD-10-CM | POA: Insufficient documentation

## 2016-12-07 DIAGNOSIS — Z79811 Long term (current) use of aromatase inhibitors: Secondary | ICD-10-CM | POA: Diagnosis not present

## 2016-12-07 DIAGNOSIS — M8589 Other specified disorders of bone density and structure, multiple sites: Secondary | ICD-10-CM

## 2016-12-07 LAB — URINALYSIS, COMPLETE (UACMP) WITH MICROSCOPIC
Bacteria, UA: NONE SEEN
Bilirubin Urine: NEGATIVE
Glucose, UA: NEGATIVE mg/dL
Hgb urine dipstick: NEGATIVE
Ketones, ur: NEGATIVE mg/dL
Leukocytes, UA: NEGATIVE
Nitrite: NEGATIVE
Protein, ur: NEGATIVE mg/dL
Specific Gravity, Urine: 1.015 (ref 1.005–1.030)
pH: 7 (ref 5.0–8.0)

## 2016-12-07 LAB — BASIC METABOLIC PANEL
Anion gap: 6 (ref 5–15)
BUN: 13 mg/dL (ref 6–20)
CO2: 25 mmol/L (ref 22–32)
Calcium: 9 mg/dL (ref 8.9–10.3)
Chloride: 108 mmol/L (ref 101–111)
Creatinine, Ser: 0.88 mg/dL (ref 0.44–1.00)
GFR calc Af Amer: >60 mL/min (ref >60)
GFR calc non Af Amer: 56 mL/min — ABNORMAL LOW (ref >60)
Glucose, Bld: 98 mg/dL (ref 65–99)
Potassium: 3.7 mmol/L (ref 3.5–5.1)
Sodium: 139 mmol/L (ref 135–145)

## 2016-12-09 LAB — URINE CULTURE

## 2016-12-18 IMAGING — CR DG CHEST 2V
1 series · 2 of 2 positions shown · non-contrast
Comparison: CT 05/06/2013, chest x-ray 08/02/2012, CT 01/27/2014

CLINICAL DATA: 89-year-old female with a history of breast
carcinoma and right upper lobe adenocarcinoma. Former smoker. No
pain or cough. No new symptoms.

EXAM:
CHEST - 2 VIEW

[Series 1: dxr chest pa (or ap) and lateral · 0.14mm/px · 2 of 2 slices shown]
[im 1/2]
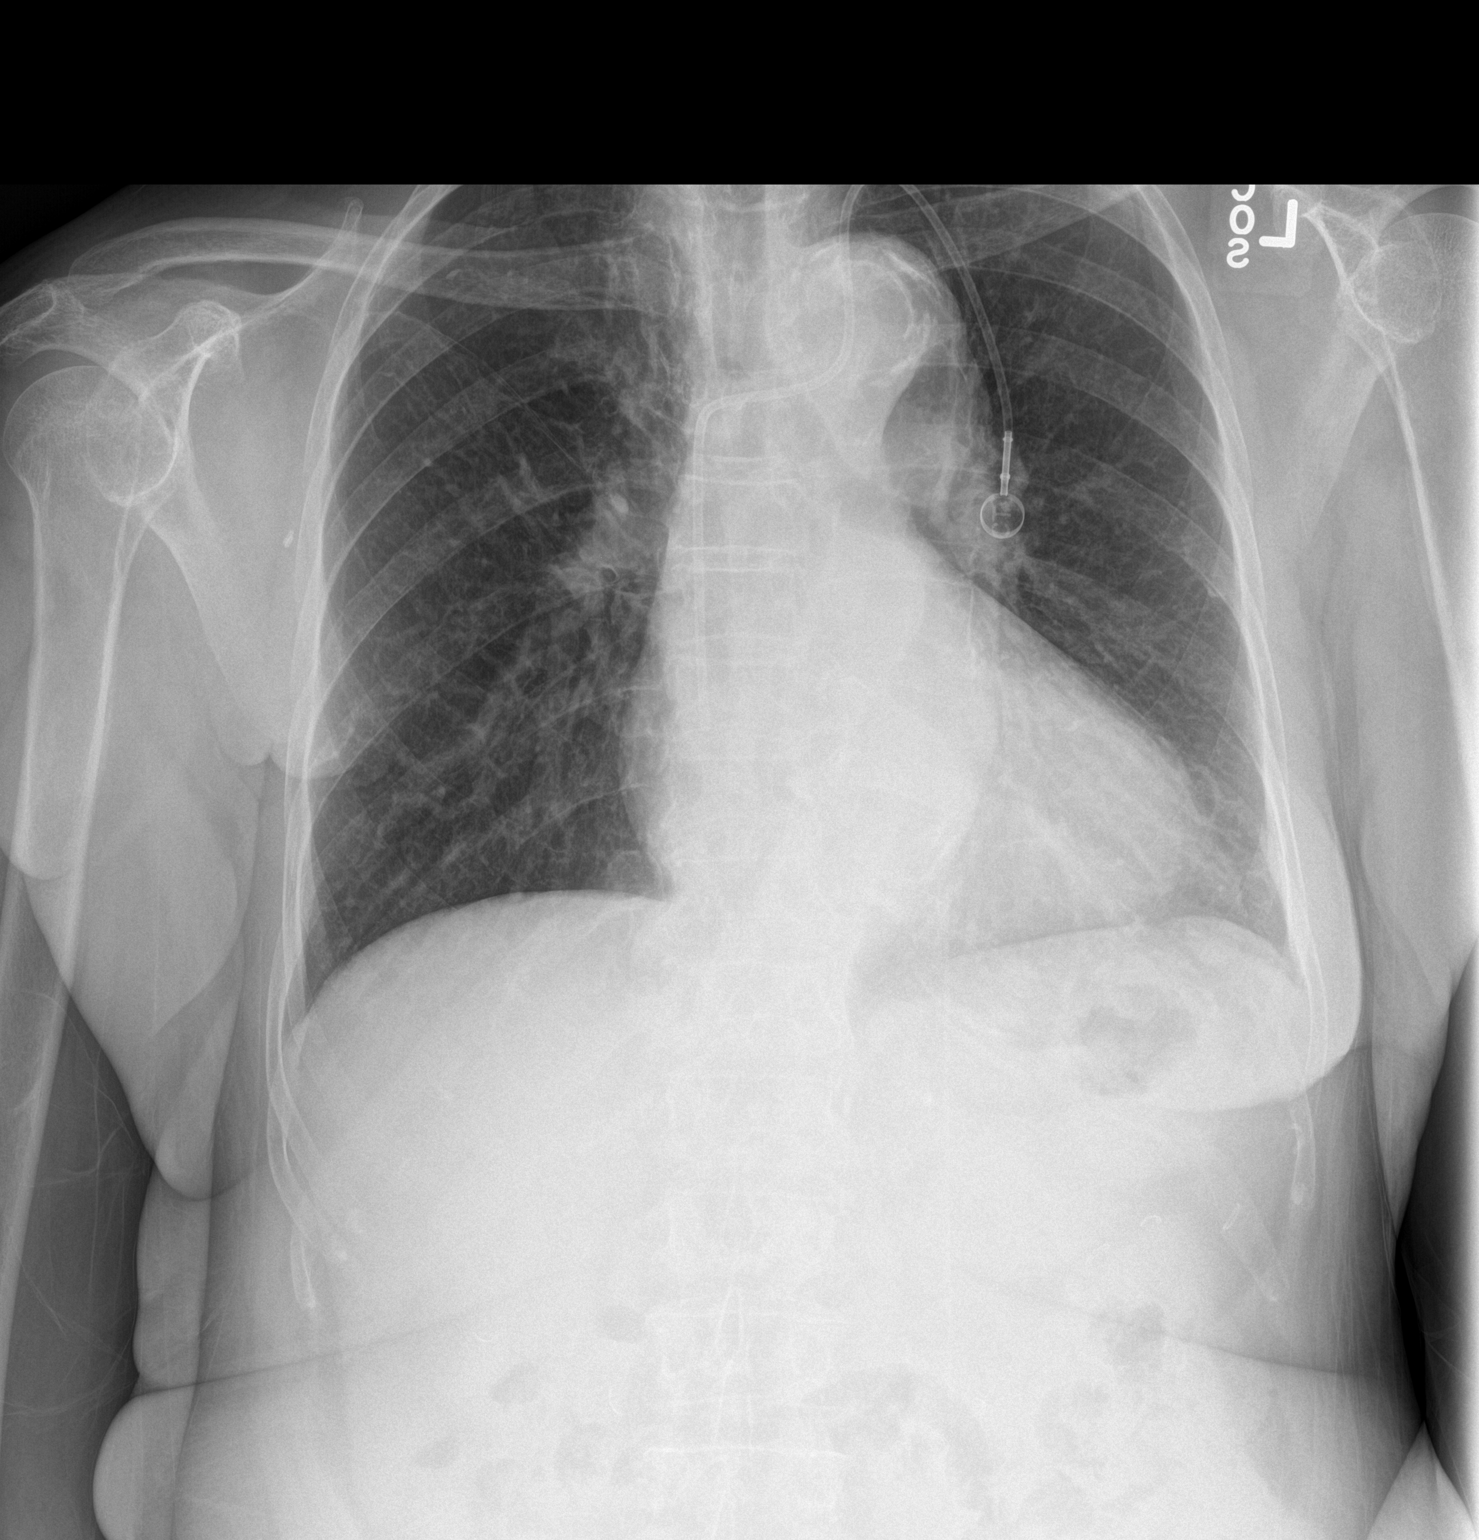
[im 2/2]
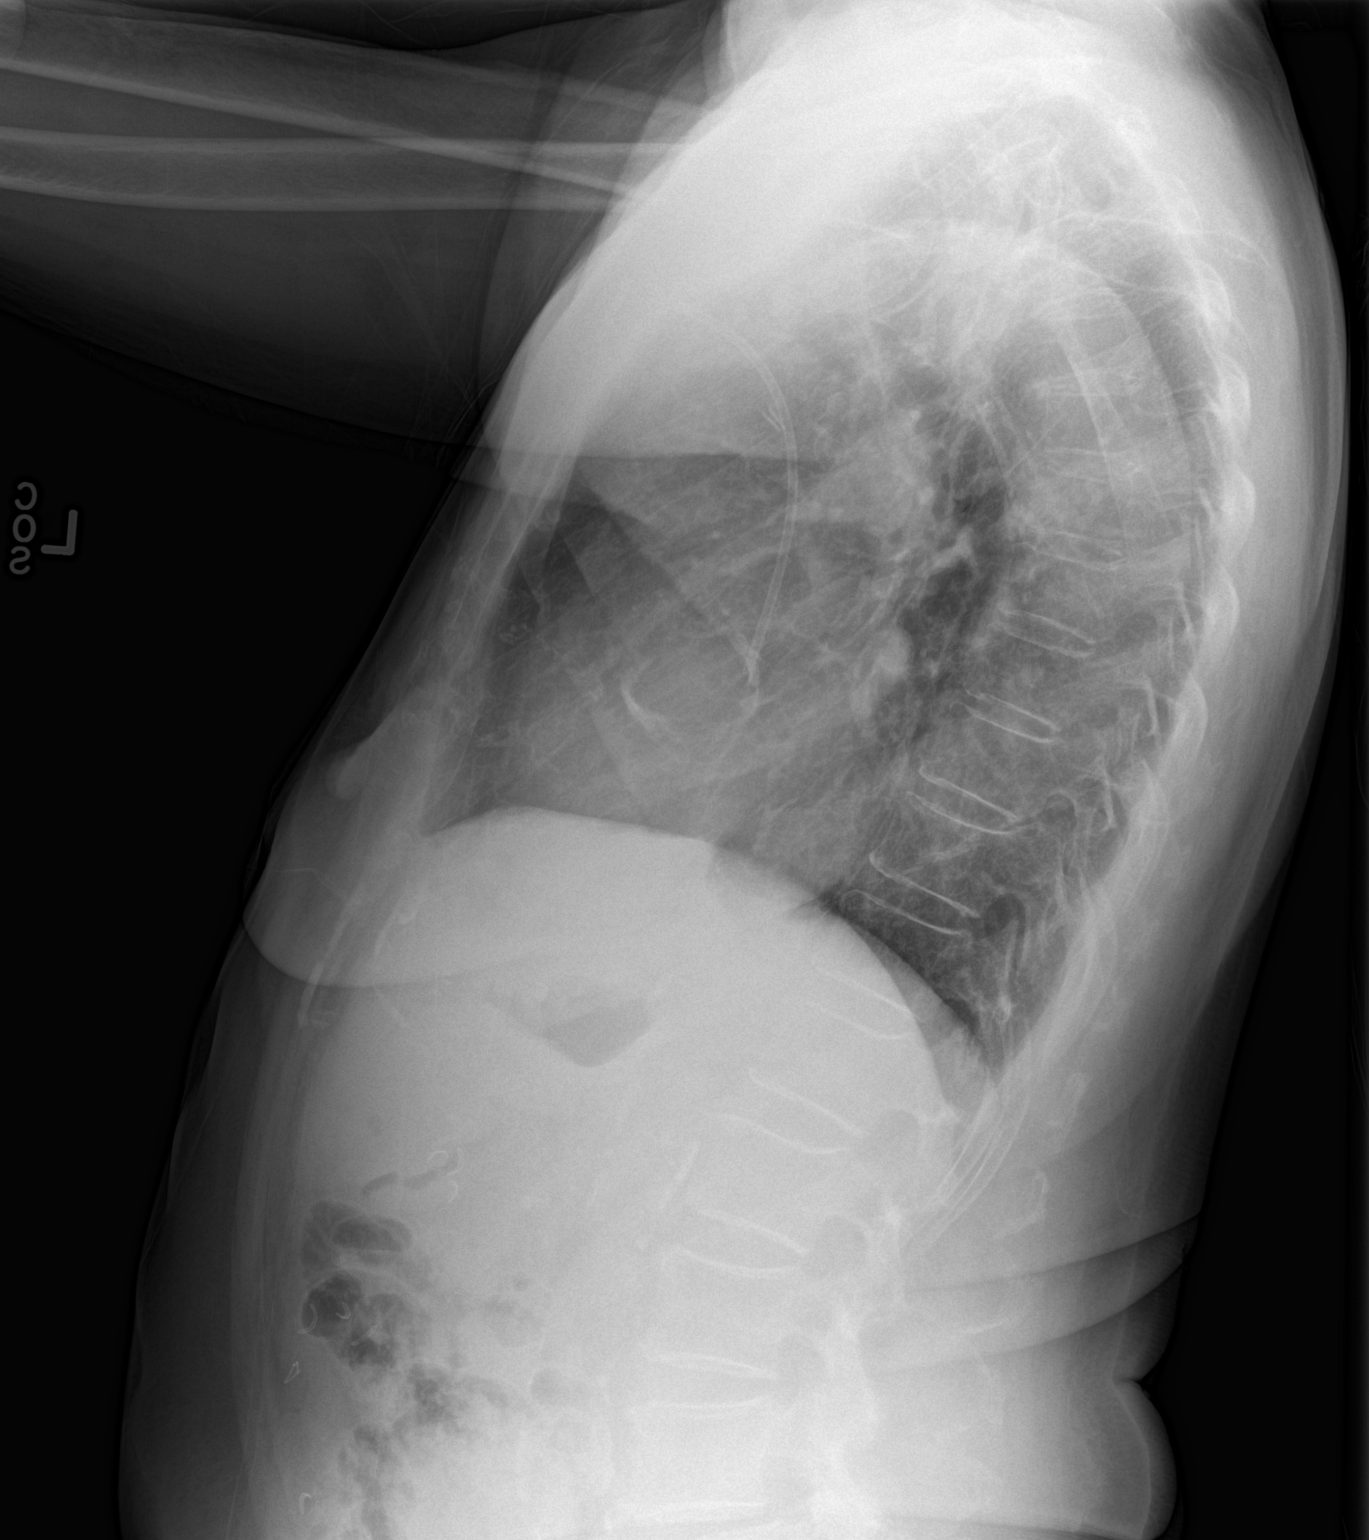

[2 of 2 positions shown; findings below may reference images not displayed]

FINDINGS: Cardiomediastinal silhouette unchanged in size and contour.
Atherosclerotic calcifications of the aortic arch. Tortuosity
descending thoracic aorta.

No evidence of pulmonary vascular congestion.

Port catheter on the left chest wall via IJ approach, appears to
terminate in the superior vena cava.

Opacity in the right hilar region and at the medial aspect of the
right upper lobe not as pronounced as the prior plain film
08/02/2012.

Density of the left upper lobe, between the anterior aspects of ribs
1 and 2, better characterized on most recent chest CT.

No confluent airspace disease. No pneumothorax. No pleural effusion.

Aortic valve calcifications.

Calcifications of the abdominal aorta.

Surgical changes of the abdomen visualized on the lateral view.

No displaced fracture.
IMPRESSION: No radiographic evidence of acute cardiopulmonary disease.

Left upper lobe nodule as well as architectural
distortion/nodularity of the medial right upper lobe in this patient
with known history of treated lung adenocarcinoma and breast cancer,
better characterized on prior CT of the chest.

Atherosclerosis and aortic valve calcifications.

## 2016-12-21 DIAGNOSIS — R413 Other amnesia: Secondary | ICD-10-CM | POA: Diagnosis not present

## 2016-12-21 DIAGNOSIS — Z8673 Personal history of transient ischemic attack (TIA), and cerebral infarction without residual deficits: Secondary | ICD-10-CM | POA: Diagnosis not present

## 2017-01-11 ENCOUNTER — Inpatient Hospital Stay: Payer: Medicare Other | Attending: Hematology and Oncology

## 2017-01-11 VITALS — BP 170/87 | HR 59 | Temp 96.7°F

## 2017-01-11 DIAGNOSIS — Z79811 Long term (current) use of aromatase inhibitors: Secondary | ICD-10-CM | POA: Diagnosis not present

## 2017-01-11 DIAGNOSIS — C3491 Malignant neoplasm of unspecified part of right bronchus or lung: Secondary | ICD-10-CM | POA: Insufficient documentation

## 2017-01-11 DIAGNOSIS — C50911 Malignant neoplasm of unspecified site of right female breast: Secondary | ICD-10-CM | POA: Diagnosis not present

## 2017-01-11 DIAGNOSIS — Z95828 Presence of other vascular implants and grafts: Secondary | ICD-10-CM

## 2017-01-11 DIAGNOSIS — Z853 Personal history of malignant neoplasm of breast: Secondary | ICD-10-CM | POA: Diagnosis not present

## 2017-01-11 DIAGNOSIS — Z9221 Personal history of antineoplastic chemotherapy: Secondary | ICD-10-CM | POA: Insufficient documentation

## 2017-01-11 DIAGNOSIS — Z170 Estrogen receptor positive status [ER+]: Secondary | ICD-10-CM | POA: Insufficient documentation

## 2017-01-11 DIAGNOSIS — M8580 Other specified disorders of bone density and structure, unspecified site: Secondary | ICD-10-CM | POA: Insufficient documentation

## 2017-01-11 DIAGNOSIS — Z452 Encounter for adjustment and management of vascular access device: Secondary | ICD-10-CM | POA: Insufficient documentation

## 2017-01-11 DIAGNOSIS — Z9011 Acquired absence of right breast and nipple: Secondary | ICD-10-CM | POA: Diagnosis not present

## 2017-01-11 MED ORDER — HEPARIN SOD (PORK) LOCK FLUSH 100 UNIT/ML IV SOLN: 500.0000 [IU] | Freq: Once | INTRAVENOUS | Status: DC

## 2017-01-11 MED ORDER — SODIUM CHLORIDE 0.9% FLUSH
10.0000 mL | INTRAVENOUS | Status: DC | PRN
  Filled 2017-01-11: qty 10

## 2017-01-13 MED ORDER — SODIUM CHLORIDE 0.9% FLUSH
10.0000 mL | INTRAVENOUS | Status: AC | PRN
  Administered 2017-01-11: 10 mL via INTRAVENOUS

## 2017-01-13 MED ORDER — HEPARIN SOD (PORK) LOCK FLUSH 100 UNIT/ML IV SOLN
500.0000 [IU] | Freq: Once | INTRAVENOUS | Status: AC
  Administered 2017-01-11: 500 [IU] via INTRAVENOUS

## 2017-01-13 NOTE — Addendum Note (Signed)
Addended by: Marcell Anger C on: 01/13/2017 10:30 AM   Modules accepted: Orders, SmartSet

## 2017-02-20 ENCOUNTER — Other Ambulatory Visit: Payer: Self-pay | Admitting: Hematology and Oncology

## 2017-03-07 NOTE — Progress Notes (Deleted)
[facility] day:  03/07/17  Chief Complaint: Alexandra Glass is a 83 y.o. female with a history of stage II breast cancer and stage IIIB lung cancer who is seen for 6 month assessment on Femara.  HPI: The patient was last seen in the medical oncology clinic on 09/07/2016.  At that time, she was doing well overall. She was eating better and gaining weight appropriately.  Exam was stable. She continued on her Femara.  We discussed BCI testing to assess benefit of extended adjuvant hormonal therapy, however patient decline due to cost.  She received Prolia. CBC was unremarkable. Potassium low at 3.3.  Bilateral screening mammogram on 11/23/2016 revealed no mammographic evidence for malignancy.  During the interim,    Past Medical History:  Diagnosis Date  . Breast cancer ([place])    right  . Hypertension   . Personal history of chemotherapy   . Personal history of radiation therapy   . Renal cancer ([place])   . Thyroid disease     Past Surgical History:  Procedure Laterality Date  . APPENDECTOMY    . BREAST BIOPSY Left    neg  . BREAST SURGERY Right   . MASTECTOMY Right 02/04/11   chemo/rad- takes femara  . tumor surg     removed from L) side  . VAGINAL HYSTERECTOMY      Family History  Problem Relation Age of Onset  . Cancer Sister        lung  . Cancer Sister        pancreas  . Cancer Sister        breast  . Breast cancer Sister     Social History:  reports that she quit smoking about 43 years ago. Her smoking use included cigarettes. she has never used smokeless tobacco. She reports that she does not drink alcohol or use drugs.  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  She lives in [place].  Her niece lives behind her and helps with her medications.  The patient is accompanied by her son, [name], and her daughter-in-law, Apolonio Schneiders, today.  Allergies:  Allergies  Allergen Reactions  . No Known Allergies     Current Medications: Current  Outpatient Medications  Medication Sig Dispense Refill  . aspirin 325 MG tablet Take 1 tablet (325 mg total) by mouth daily. 90 tablet 0  . atorvastatin (LIPITOR) 20 MG tablet Take 1 tablet (20 mg total) by mouth daily. 30 tablet 6  . donepezil (ARICEPT) 10 MG tablet Take 10 mg by mouth at bedtime. [place]    . hydrochlorothiazide (HYDRODIURIL) 12.5 MG tablet Take 1 tablet (12.5 mg total) by mouth daily. 30 tablet 6  . latanoprost (XALATAN) 0.005 % ophthalmic solution     . letrozole (FEMARA) 2.5 MG tablet TAKE (1) TABLET BY MOUTH EVERY DAY 30 tablet 0  . levothyroxine (SYNTHROID, LEVOTHROID) 75 MCG tablet TAKE (1) TABLET BY MOUTH EVERY DAY 30 tablet 5  . lidocaine-prilocaine (EMLA) cream Apply 1 application topically as needed. 30 g 0  . memantine (NAMENDA) 5 MG tablet Take 5 mg by mouth 2 (two) times daily. Dr Melrose Nakayama    . potassium chloride (K-DUR) 10 MEQ tablet Take 1 tablet (10 mEq total) by mouth daily. 30 tablet 6  . vitamin B-12 (CYANOCOBALAMIN) 1000 MCG tablet Take 1,000 mcg by mouth daily.     No current facility-administered medications for this visit.    Facility-Administered Medications Ordered in Other Visits  Medication Dose Route Frequency  Provider Last Rate Last Dose  . sodium chloride flush (NS) 0.9 % injection 10 mL  10 mL Intravenous PRN Lequita Asal, MD   10 mL at 01/11/17 1029    Review of Systems:  GENERAL:  Feels "pretty good".  No fevers or sweats.  Weight up 2 pounds. PERFORMANCE STATUS (ECOG):  2 HEENT: No visual changes, runny nose, sore throat, mouth sores or tenderness.  Awaiting dental clearance. Lungs:  No shortness of breath or cough.  No hemoptysis. Cardiac:  No chest pain, palpitations, orthopnea, or PND. GI:  Eating "as much as I can".  (see HPI).  No nausea, vomiting, diarrhea, constipation, melena or hematochezia. GU:  No urgency, frequency, dysuria, or hematuria. Musculoskeletal:  No back pain.  No joint pain.  No muscle  tenderness. Extremities: No pain or swelling. Skin:  No rashes or skin changes. Neuro:  Dementia.  No headache, numbness or weakness, balance or coordination issues. Endocrine:  No diabetes.  Thyroid disease on Synthroid.  No hot flashes or night sweats. Psych:  No mood changes, depression or anxiety. Pain:  No focal pain. Review of systems:  All other systems reviewed and found to be negative.  Physical Exam: There were no vitals taken for this visit. GENERAL:  Thin elderly woman with marked vitiligo sitting comfortably in the exam room in no acute distress. MENTAL STATUS:  Alert and oriented to person, place and time. HEAD:  Graying hair pulled back.  Normocephalic, atraumatic, face symmetric, no Cushingoid features. EYES:  Glasses.  Brown eyes.  Pupils equal round and reactive to light and accomodation.  No conjunctivitis or scleral icterus. ENT:  Oropharynx clear without lesion.  Dentures.  Tongue normal. Mucous membranes moist.  RESPIRATORY:  Clear to auscultation without rales, wheezes or rhonchi. CARDIOVASCULAR:  Regular rate and rhythm without murmur, rub or gallop. CHEST WALL:  Port-a-cath. BREASTS:  Right sided mastectomy well healed.  No skin changes, erythema or nodularity.  Left breast without masses, skin changes or nipple discharge. ABDOMEN:  Soft, non-tender, with active bowel sounds, and no hepatosplenomegaly.  No masses. SKIN:  Vitiligo involving face, chest, and hands.  No rashes, ulcers or lesions. EXTREMITIES: Right arm lymphedema.  No lower extremity edema, no skin discoloration or tenderness.  No palpable cords. LYMPH NODES: No palpable cervical, supraclavicular, axillary or inguinal adenopathy  NEUROLOGICAL: Unremarkable. PSYCH:  Appropriate.   Imaging studies: 01/27/2014:  Chest, abdomen, and pelvic CT revealed stable RUL pulmonary nodule, 12 x 7 mm ground glass attenuation of the LUL nodule, and multiple bilateral aggressive appearing renal lesions.  The largest  was 4.5 x 3.7 cm in the left kidney.  Lesions were highly suspicious for renal cell carcinoma.  Given her age and bilateral disease, a decision by tumor board was for ongoing surveillance. 12/05/2014:  Chest, abdomen, and pelvic CT revealed mild progression of the 2.7 cm spiculated medial right upper lobe pulmonary nodule. There was a stable 1.1 cm subsolid left upper lobe pulmonary nodule. There was mild progression of subcarinal mediastinal adenopathy. There were 4 heterogeneous enhancing solid renal masses (1 on the right and 3 on the left kidney) which were slightly enlarged and all suspicious for renal cell carcinoma. There was mild progression of left periaortic adenopathy. She had a 2.6 cm infrarenal abdominal aortic aneurysm. 06/09/2015:  Chest, abdomen, and pelvic CT revealed a mild interval decrease size (2.4 x 1.7 cm) of medial right upper lobe pulmonary nodule.   There has been interval stability (1.1 x 0.8 cm)  of previously described subsolid left upper lobe pulmonary nodule.   The pure ground-glass apical left upper lobe pulmonary nodule had mildly increased in size (1.4 x 1.2 cm).  There was stable mild subcarinal mediastinal and left para-aortic retroperitoneal lymphadenopathy.  There was stable bilateral indeterminate renal masses, suspicious for renal cell carcinoma based on prior contrast-enhanced studies. 12/07/2015:  Chest, abdomen, and pelvic CT evealed several stable masses in the left kidney.  There was some mild left periaortic adenopathy (stable).  There was stable volume loss and paramediastinal density in the right suprahilar region.  There was sub solid nodularity in the left upper lobe (reduced to stable).  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  06/08/2016:  Chest, abdomen, and pelvic CT revealed a stable exam with no new or progressive findings.  There were stable multiple left renal lesions.  There was stable mild left  para-aortic lymphadenopathy, some of which were mineralized.  There was stable appearance of volume loss/consolidation medial right upper low, likely radiation related.  There was sub solid anterior left apical nodule and adjacent spiculated opacity with slight interval decrease in size since prior  Study.  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   No visits with results within 3 Day(s) from this visit.  Latest known visit with results is:  Appointment on 12/07/2016  Component Date Value Ref Range Status  . Sodium 12/07/2016 139  135 - 145 mmol/L Final  . Potassium 12/07/2016 3.7  3.5 - 5.1 mmol/L Final  . Chloride 12/07/2016 108  101 - 111 mmol/L Final  . CO2 12/07/2016 25  22 - 32 mmol/L Final  . Glucose, Bld 12/07/2016 98  65 - 99 mg/dL Final  . BUN 12/07/2016 13  6 - 20 mg/dL Final  . Creatinine, Ser 12/07/2016 0.88  0.44 - 1.00 mg/dL Final  . Calcium 12/07/2016 9.0  8.9 - 10.3 mg/dL Final  . GFR calc non Af Amer 12/07/2016 56* >60 mL/min Final  . GFR calc Af Amer 12/07/2016 >60  >60 mL/min Final   Comment: (NOTE) The eGFR has been calculated using the CKD EPI equation. This calculation has not been validated in all clinical situations. eGFR's persistently <60 mL/min signify possible Chronic Kidney Disease.   . Anion gap 12/07/2016 6  5 - 15 Final  . Color, Urine 12/07/2016 YELLOW  YELLOW Final  . APPearance 12/07/2016 CLEAR  CLEAR Final  . Specific Gravity, Urine 12/07/2016 1.015  1.005 - 1.030 Final  . pH 12/07/2016 7.0  5.0 - 8.0 Final  . Glucose, UA 12/07/2016 NEGATIVE  NEGATIVE mg/dL Final  . Hgb urine dipstick 12/07/2016 NEGATIVE  NEGATIVE Final  . Bilirubin Urine 12/07/2016 NEGATIVE  NEGATIVE Final  . Ketones, ur 12/07/2016 NEGATIVE  NEGATIVE mg/dL Final  . Protein, ur 12/07/2016 NEGATIVE  NEGATIVE mg/dL Final  . Nitrite 12/07/2016 NEGATIVE  NEGATIVE Final  . Leukocytes, UA 12/07/2016 NEGATIVE  NEGATIVE Final  . Squamous Epithelial / LPF  12/07/2016 0-5* NONE SEEN Final  . WBC, UA 12/07/2016 0-5  0 - 5 WBC/hpf Final  . RBC / HPF 12/07/2016 0-5  0 - 5 RBC/hpf Final  . Bacteria, UA 12/07/2016 NONE SEEN  NONE SEEN Final  . Specimen Description 12/07/2016 URINE, CLEAN CATCH   Final  . Special Requests 12/07/2016 NONE   Final  . Culture 12/07/2016 MULTIPLE SPECIES PRESENT, SUGGEST RECOLLECTION*  Final  . Report Status 12/07/2016 12/09/2016 FINAL  Final    Assessment:  Alexandra Glass is a 83 y.o. female with a history of stage II right breast cancer and stage IIIB right-sided lung cancer.  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   She underwent a right modified radical mastectomy and node dissection on 02/04/2011. Pathology revealed grade II multifocal disease (largest lesion 3.6 cm).  There was angiolymphatic invasion. Nine lymph nodes were negative. She received radiation to the chest wall and lymphatics from 04/25/2011 until 06/15/2011.  She is on Femara.  She declined BCI testing secondary to costs and wished to proceed with 10 years of adjuvant hormonal therapy (rather than 5 years).  Mammogram on 11/23/2015 revealed no evidence of malignancy.  Bilateral screening mammogram  on 11/23/2016 revealed no mammographic evidence for malignancy.  CA27.29 has been followed: 29.6 on 04/07/2014, 21.6 on 08/06/2014, 28.9 on 11/12/2014, 13.7 on 06/10/2015, 20.9 on 12/09/2015, and 24.2 on 06/15/2016.  Bone density study on 03/19/2014 revealed osteopenia with a T score of -1.9 in L1-L4 and -1.6 in the right femur.  Bone density study on 03/21/2016 revealed osteopenia with a T score of -2.2 in the right femoral neck and -1.8 in the AP spine L1-L4.  She has received Prolia every 6 months (began 09/07/2016).  She was diagnosed with stage IIIB right lung cancer in 2014.  Pathology revealed differentiated adenocarcinoma consistent with lung primary. EGFR and ALK were negative. She received 6 weeks of concurrent carboplatin and Taxol from  08/08/2012 until 10/08/2012.    CEA has been followed:  3.0 on 01/06/2011, 2.5 on 11/12/2014, 2.1 on 06/10/2015, and 2.2 on 06/15/2016.  Chest, abdomen, and pelvic CT on 01/27/2014 revealed stable RUL pulmonary nodule, 12 x 7 mm ground glass attenuation of the LUL nodule, and multiple bilateral aggressive appearing renal lesions.  The largest was 4.5 x 3.7 cm in the left kidney.  Lesions were highly suspicious for renal cell carcinoma.  Given her age and bilateral disease, a decision by tumor board was for ongoing surveillance.  Chest, abdomen, and pelvic CT on 06/08/2016 revealed a stable exam with no new or progressive findings.  She is not interested in a biopsy or treatment for the enlarging renal masses.  Symptomatically,   Plan: 1.  Labs today:  CBC with diff, CMP, ferritin, CA27.29, CEA. 2.  Review interval mammogram- no evidence of disease. 3.  Prolia injection today.  4.  Continue calcium 1200 mg a day with vitamin D 800 units. 5.  Continue Femara.  6.  Continue port flushes every 6-8 weeks. 7.  Schedule follow up CT of the chest, abdomen, and pelvis without contrast for 06/08/2017.  3.  Discuss renal lesions.  No intervention at this time per patient. 4.  Discuss h/o lung cancer.  Anticipate follow-up imaging later this year. 5.  Discuss hypokalemia secondary to HCTZ.  She is currently taking potassium chloride 10 meq a day.  Increase potassium to 10 meq BID x 3 days then back to 10 meq a day. 6.  RTC in 6 months for MD assessment, labs (CBC with diff, CMP, CA27.29), and Prolia   Honor Loh, NP  03/07/2017, 5:43 PM   I saw and evaluated the patient, participating in the key portions of the service and reviewing pertinent diagnostic studies and records.  I reviewed the nurse practitioner's note and agree with the findings and the plan.  The assessment and plan were discussed with the patient.  Additional diagnostic studies of *** are needed to clarify *** and would  change the  clinical management.  A few ***multiple questions were asked by the patient and answered.   Nolon Stalls, MD 03/08/2017,4:18 AM

## 2017-03-08 ENCOUNTER — Other Ambulatory Visit: Payer: Self-pay | Admitting: Hematology and Oncology

## 2017-03-08 ENCOUNTER — Inpatient Hospital Stay: Payer: Medicare Other

## 2017-03-08 ENCOUNTER — Inpatient Hospital Stay: Payer: Medicare Other | Attending: Hematology and Oncology | Admitting: Hematology and Oncology

## 2017-03-08 DIAGNOSIS — R718 Other abnormality of red blood cells: Secondary | ICD-10-CM | POA: Insufficient documentation

## 2017-03-08 DIAGNOSIS — C3411 Malignant neoplasm of upper lobe, right bronchus or lung: Secondary | ICD-10-CM

## 2017-03-21 NOTE — Progress Notes (Signed)
[facility] day:  03/22/17  Chief Complaint: Alexandra Glass is a 83 y.o. female with a history of stage II breast cancer and stage IIIB lung cancer who is seen for 6 month assessment on Femara.  HPI: The patient was last seen in the medical oncology clinic on 09/07/2016.  At that time, she was doing well. She was eating better and gaining weight appropriately.  Exam was stable. She continued Femara.  We discussed BCI testing to assess benefit of extended adjuvant hormonal therapy, however patient declined due to cost.  She received Prolia. CBC was unremarkable. Potassium low at 3.3.  Bilateral screening mammogram on 11/23/2016 revealed no mammographic evidence for malignancy.  During the interim, she has felt good.  She denies any problems.  She notes that sometimes her right leg is stiff and gives out.  Tylenol helps.  She has been raking leaves in the yard.   Past Medical History:  Diagnosis Date  . Breast cancer ([place])    right  . Hypertension   . Personal history of chemotherapy   . Personal history of radiation therapy   . Renal cancer ([place])   . Thyroid disease     Past Surgical History:  Procedure Laterality Date  . APPENDECTOMY    . BREAST BIOPSY Left    neg  . BREAST SURGERY Right   . MASTECTOMY Right 02/04/11   chemo/rad- takes femara  . tumor surg     removed from L) side  . VAGINAL HYSTERECTOMY      Family History  Problem Relation Age of Onset  . Cancer Sister        lung  . Cancer Sister        pancreas  . Cancer Sister        breast  . Breast cancer Sister     Social History:  reports that she quit smoking about 43 years ago. Her smoking use included cigarettes. she has never used smokeless tobacco. She reports that she does not drink alcohol or use drugs.  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  She lives in [place].  Her niece lives behind her and helps with her medications.  The patient is accompanied by her son, [name], and  her daughter-in-law, Apolonio Schneiders, today.  Allergies:  Allergies  Allergen Reactions  . No Known Allergies     Current Medications: Current Outpatient Medications  Medication Sig Dispense Refill  . aspirin 325 MG tablet Take 1 tablet (325 mg total) by mouth daily. 90 tablet 0  . atorvastatin (LIPITOR) 20 MG tablet Take 1 tablet (20 mg total) by mouth daily. 30 tablet 6  . donepezil (ARICEPT) 10 MG tablet Take 10 mg by mouth at bedtime. [place]    . hydrochlorothiazide (HYDRODIURIL) 12.5 MG tablet Take 1 tablet (12.5 mg total) by mouth daily. 30 tablet 6  . latanoprost (XALATAN) 0.005 % ophthalmic solution     . letrozole (FEMARA) 2.5 MG tablet TAKE (1) TABLET BY MOUTH EVERY DAY 30 tablet 0  . levothyroxine (SYNTHROID, LEVOTHROID) 75 MCG tablet TAKE (1) TABLET BY MOUTH EVERY DAY 30 tablet 5  . lidocaine-prilocaine (EMLA) cream Apply 1 application topically as needed. 30 g 0  . memantine (NAMENDA) 5 MG tablet Take 5 mg by mouth 2 (two) times daily. Dr Melrose Nakayama    . potassium chloride (K-DUR) 10 MEQ tablet Take 1 tablet (10 mEq total) by mouth daily. 30 tablet 6  . vitamin B-12 (CYANOCOBALAMIN) 1000 MCG tablet Take  1,000 mcg by mouth daily.     No current facility-administered medications for this visit.    Facility-Administered Medications Ordered in Other Visits  Medication Dose Route Frequency Provider Last Rate Last Dose  . sodium chloride flush (NS) 0.9 % injection 10 mL  10 mL Intravenous PRN Nolon Stalls C, MD   10 mL at 01/11/17 1029  . sodium chloride flush (NS) 0.9 % injection 10 mL  10 mL Intravenous PRN Lequita Asal, MD   10 mL at 03/22/17 1118    Review of Systems:  GENERAL:  Feels "good".  No fevers or sweats.  Weight stable. PERFORMANCE STATUS (ECOG):  2 HEENT: No visual changes, runny nose, sore throat, mouth sores or tenderness.  Awaiting dental clearance. Lungs:  No shortness of breath or cough.  No hemoptysis. Cardiac:  No chest pain, palpitations, orthopnea, or  PND. GI:  Eating well.  No nausea, vomiting, diarrhea, constipation, melena or hematochezia. GU:  No urgency, frequency, dysuria, or hematuria. Musculoskeletal:  No back pain.  No joint pain.  No muscle tenderness. Extremities: Right leg stiff.  No pain or swelling. Skin:  No rashes or skin changes. Neuro:  Dementia.  No headache, numbness or weakness, balance or coordination issues. Endocrine:  No diabetes.  Thyroid disease on Synthroid.  No hot flashes or night sweats. Psych:  No mood changes, depression or anxiety. Pain:  No focal pain. Review of systems:  All other systems reviewed and found to be negative.  Physical Exam: Blood pressure (!) 146/77, pulse (!) 58, temperature 97.6 F (36.4 C), temperature source Tympanic, weight 134 lb 14.7 oz (61.2 kg). GENERAL:  Thin elderly woman with marked vitiligo sitting comfortably in the exam room in no acute distress. MENTAL STATUS:  Alert and oriented to person, place and time. HEAD:  Graying hair pulled back.  Normocephalic, atraumatic, face symmetric, no Cushingoid features. EYES:  Glasses.  Brown eyes.  Pupils equal round and reactive to light and accomodation.  No conjunctivitis or scleral icterus. ENT:  Oropharynx clear without lesion.  Dentures.  Tongue normal. Mucous membranes moist.  RESPIRATORY:  Clear to auscultation without rales, wheezes or rhonchi. CARDIOVASCULAR:  Regular rate and rhythm without murmur, rub or gallop. CHEST WALL:  Port-a-cath. BREASTS:  Right sided mastectomy well healed.  No skin changes, erythema or nodularity.  Left breast with superior fibrocystic changes.  No masses, skin changes or nipple discharge. ABDOMEN:  Soft, non-tender, with active bowel sounds, and no hepatosplenomegaly.  No masses. SKIN:  Vitiligo involving face, chest, and hands.  No rashes, ulcers or lesions. EXTREMITIES: Right arm lymphedema.  No lower extremity edema, no skin discoloration or tenderness.  No palpable cords. LYMPH NODES: No  palpable cervical, supraclavicular, axillary or inguinal adenopathy  NEUROLOGICAL: Unremarkable.  Lower extremity strength ans sensation intact and symmetric. PSYCH:  Appropriate.   Imaging studies: 01/27/2014:  Chest, abdomen, and pelvic CT revealed stable RUL pulmonary nodule, 12 x 7 mm ground glass attenuation of the LUL nodule, and multiple bilateral aggressive appearing renal lesions.  The largest was 4.5 x 3.7 cm in the left kidney.  Lesions were highly suspicious for renal cell carcinoma.  Given her age and bilateral disease, a decision by tumor board was for ongoing surveillance. 12/05/2014:  Chest, abdomen, and pelvic CT revealed mild progression of the 2.7 cm spiculated medial right upper lobe pulmonary nodule. There was a stable 1.1 cm subsolid left upper lobe pulmonary nodule. There was mild progression of subcarinal mediastinal adenopathy. There were  4 heterogeneous enhancing solid renal masses (1 on the right and 3 on the left kidney) which were slightly enlarged and all suspicious for renal cell carcinoma. There was mild progression of left periaortic adenopathy. She had a 2.6 cm infrarenal abdominal aortic aneurysm. 06/09/2015:  Chest, abdomen, and pelvic CT revealed a mild interval decrease size (2.4 x 1.7 cm) of medial right upper lobe pulmonary nodule.   There has been interval stability (1.1 x 0.8 cm) of previously described subsolid left upper lobe pulmonary nodule.   The pure ground-glass apical left upper lobe pulmonary nodule had mildly increased in size (1.4 x 1.2 cm).  There was stable mild subcarinal mediastinal and left para-aortic retroperitoneal lymphadenopathy.  There was stable bilateral indeterminate renal masses, suspicious for renal cell carcinoma based on prior contrast-enhanced studies. 12/07/2015:  Chest, abdomen, and pelvic CT evealed several stable masses in the left kidney.  There was some mild left periaortic adenopathy (stable).  There was stable volume loss and  paramediastinal density in the right suprahilar region.  There was sub solid nodularity in the left upper lobe (reduced to stable).  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  06/08/2016:  Chest, abdomen, and pelvic CT revealed a stable exam with no new or progressive findings.  There were stable multiple left renal lesions.  There was stable mild left para-aortic lymphadenopathy, some of which were mineralized.  There was stable appearance of volume loss/consolidation medial right upper low, likely radiation related.  There was sub solid anterior left apical nodule and adjacent spiculated opacity with slight interval decrease in size since prior  Study.  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   Infusion on 03/22/2017  Component Date Value Ref Range Status  . Sodium 03/22/2017 140  135 - 145 mmol/L Final  . Potassium 03/22/2017 3.6  3.5 - 5.1 mmol/L Final  . Chloride 03/22/2017 108  101 - 111 mmol/L Final  . CO2 03/22/2017 25  22 - 32 mmol/L Final  . Glucose, Bld 03/22/2017 103* 65 - 99 mg/dL Final  . BUN 03/22/2017 19  6 - 20 mg/dL Final  . Creatinine, Ser 03/22/2017 0.85  0.44 - 1.00 mg/dL Final  . Calcium 03/22/2017 9.2  8.9 - 10.3 mg/dL Final  . Total Protein 03/22/2017 6.7  6.5 - 8.1 g/dL Final  . Albumin 03/22/2017 3.7  3.5 - 5.0 g/dL Final  . AST 03/22/2017 18  15 - 41 U/L Final  . ALT 03/22/2017 14  14 - 54 U/L Final  . Alkaline Phosphatase 03/22/2017 50  38 - 126 U/L Final  . Total Bilirubin 03/22/2017 0.5  0.3 - 1.2 mg/dL Final  . GFR calc non Af Amer 03/22/2017 59* >60 mL/min Final  . GFR calc Af Amer 03/22/2017 >60  >60 mL/min Final   Comment: (NOTE) The eGFR has been calculated using the CKD EPI equation. This calculation has not been validated in all clinical situations. eGFR's persistently <60 mL/min signify possible Chronic Kidney Disease.   . Anion gap 03/22/2017 7  5 - 15 Final  . WBC  03/22/2017 3.9  3.6 - 11.0 K/uL Final  . RBC 03/22/2017 4.71  3.80 - 5.20 MIL/uL Final  . Hemoglobin 03/22/2017 11.9* 12.0 - 16.0 g/dL Final  . HCT 03/22/2017 36.9  35.0 - 47.0 % Final  . MCV 03/22/2017 78.2* 80.0 - 100.0 fL Final  . MCH 03/22/2017 25.3* 26.0 - 34.0 pg  Final  . MCHC 03/22/2017 32.3  32.0 - 36.0 g/dL Final  . RDW 03/22/2017 17.2* 11.5 - 14.5 % Final  . Platelets 03/22/2017 186  150 - 440 K/uL Final  . Neutrophils Relative % 03/22/2017 PENDING  % Incomplete  . Neutro Abs 03/22/2017 PENDING  1.7 - 7.7 K/uL Incomplete  . Band Neutrophils 03/22/2017 PENDING  % Incomplete  . Lymphocytes Relative 03/22/2017 PENDING  % Incomplete  . Lymphs Abs 03/22/2017 PENDING  0.7 - 4.0 K/uL Incomplete  . Monocytes Relative 03/22/2017 PENDING  % Incomplete  . Monocytes Absolute 03/22/2017 PENDING  0.1 - 1.0 K/uL Incomplete  . Eosinophils Relative 03/22/2017 PENDING  % Incomplete  . Eosinophils Absolute 03/22/2017 PENDING  0.0 - 0.7 K/uL Incomplete  . Basophils Relative 03/22/2017 PENDING  % Incomplete  . Basophils Absolute 03/22/2017 PENDING  0.0 - 0.1 K/uL Incomplete  . WBC Morphology 03/22/2017 PENDING   Incomplete  . RBC Morphology 03/22/2017 PENDING   Incomplete  . Smear Review 03/22/2017 PENDING   Incomplete  . Other 03/22/2017 PENDING  % Incomplete  . nRBC 03/22/2017 PENDING  0 /100 WBC Incomplete  . Metamyelocytes Relative 03/22/2017 PENDING  % Incomplete  . Myelocytes 03/22/2017 PENDING  % Incomplete  . Promyelocytes Absolute 03/22/2017 PENDING  % Incomplete  . Blasts 03/22/2017 PENDING  % Incomplete    Assessment:  Alexandra Glass is a 83 y.o. female with a history of stage II right breast cancer and stage IIIB right-sided lung cancer.  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   She underwent a right modified radical mastectomy and node dissection on 02/04/2011. Pathology revealed grade II multifocal disease (largest lesion 3.6 cm).  There was angiolymphatic invasion. Nine lymph  nodes were negative. She received radiation to the chest wall and lymphatics from 04/25/2011 until 06/15/2011.  She is on Femara.  She declined BCI testing secondary to costs and wished to proceed with 10 years of adjuvant hormonal therapy (rather than 5 years).  Bilateral screening mammogram  on 11/23/2016 revealed no mammographic evidence for malignancy.  CA27.29 has been followed: 29.6 on 04/07/2014, 21.6 on 08/06/2014, 28.9 on 11/12/2014, 13.7 on 06/10/2015, 20.9 on 12/09/2015, 24.2 on 06/15/2016, and 26.8 on 03/22/2017.  Bone density study on 03/19/2014 revealed osteopenia with a T score of -1.9 in L1-L4 and -1.6 in the right femur.  Bone density study on 03/21/2016 revealed osteopenia with a T score of -2.2 in the right femoral neck and -1.8 in the AP spine L1-L4.  She has received Prolia every 6 months (began 09/07/2016).  She was diagnosed with stage IIIB right lung cancer in 2014.  Pathology revealed differentiated adenocarcinoma consistent with lung primary. EGFR and ALK were negative. She received 6 weeks of concurrent carboplatin and Taxol from 08/08/2012 until 10/08/2012.    CEA has been followed:  3.0 on 01/06/2011, 2.5 on 11/12/2014, 2.1 on 06/10/2015, 2.2 on 06/15/2016, and 4.3 on 03/22/2017.  Chest, abdomen, and pelvic CT on 01/27/2014 revealed stable RUL pulmonary nodule, 12 x 7 mm ground glass attenuation of the LUL nodule, and multiple bilateral aggressive appearing renal lesions.  The largest was 4.5 x 3.7 cm in the left kidney.  Lesions were highly suspicious for renal cell carcinoma.  Given her age and bilateral disease, a decision by tumor board was for ongoing surveillance.  Chest, abdomen, and pelvic CT on 06/08/2016 revealed a stable exam with no new or progressive findings.  She is not interested in a biopsy or treatment for the enlarging renal masses.  Symptomatically, she feels good.  Exam is stable.  Plan: 1.  Labs today:  CBC with diff, CMP, ferritin, CA27.29,  CEA. 2.  Review interval mammogram- no evidence of disease. 3.  Prolia injection today.  4.  Continue calcium 1200 mg a day with vitamin D 800 IU daily. 5.  Continue Femara as scheduled.  6.  Continue port flushes every 6-8 weeks. 7.  Schedule chest, abdomen, and pelvis CT without contrast for 06/08/2017. 8.  RTC in 6 months for MD assessment, labs (CBC with diff, CMP, CA27.29), and Prolia   Honor Loh, NP  03/22/2017, 12:02 PM   I saw and evaluated the patient, participating in the key portions of the service and reviewing pertinent diagnostic studies and records.  I reviewed the nurse practitioner's note and agree with the findings and the plan.  The assessment and plan were discussed with the patient.  Several questions were asked by the patient and answered.   Nolon Stalls, MD 03/22/2017,12:02 PM

## 2017-03-22 ENCOUNTER — Inpatient Hospital Stay: Payer: Medicare Other | Attending: Hematology and Oncology

## 2017-03-22 ENCOUNTER — Inpatient Hospital Stay (HOSPITAL_BASED_OUTPATIENT_CLINIC_OR_DEPARTMENT_OTHER): Payer: Medicare Other | Admitting: Hematology and Oncology

## 2017-03-22 ENCOUNTER — Inpatient Hospital Stay: Payer: Medicare Other

## 2017-03-22 ENCOUNTER — Other Ambulatory Visit: Payer: Self-pay | Admitting: Hematology and Oncology

## 2017-03-22 VITALS — BP 146/77 | HR 58 | Temp 97.6°F | Wt 134.9 lb

## 2017-03-22 DIAGNOSIS — N2889 Other specified disorders of kidney and ureter: Secondary | ICD-10-CM

## 2017-03-22 DIAGNOSIS — Z853 Personal history of malignant neoplasm of breast: Secondary | ICD-10-CM | POA: Diagnosis not present

## 2017-03-22 DIAGNOSIS — I10 Essential (primary) hypertension: Secondary | ICD-10-CM | POA: Insufficient documentation

## 2017-03-22 DIAGNOSIS — Z79811 Long term (current) use of aromatase inhibitors: Secondary | ICD-10-CM | POA: Insufficient documentation

## 2017-03-22 DIAGNOSIS — Z170 Estrogen receptor positive status [ER+]: Secondary | ICD-10-CM

## 2017-03-22 DIAGNOSIS — M8580 Other specified disorders of bone density and structure, unspecified site: Secondary | ICD-10-CM | POA: Diagnosis not present

## 2017-03-22 DIAGNOSIS — C3411 Malignant neoplasm of upper lobe, right bronchus or lung: Secondary | ICD-10-CM

## 2017-03-22 DIAGNOSIS — C50911 Malignant neoplasm of unspecified site of right female breast: Secondary | ICD-10-CM | POA: Diagnosis not present

## 2017-03-22 DIAGNOSIS — E079 Disorder of thyroid, unspecified: Secondary | ICD-10-CM | POA: Insufficient documentation

## 2017-03-22 DIAGNOSIS — Z79899 Other long term (current) drug therapy: Secondary | ICD-10-CM | POA: Insufficient documentation

## 2017-03-22 DIAGNOSIS — Z7982 Long term (current) use of aspirin: Secondary | ICD-10-CM | POA: Diagnosis not present

## 2017-03-22 DIAGNOSIS — Z9011 Acquired absence of right breast and nipple: Secondary | ICD-10-CM | POA: Insufficient documentation

## 2017-03-22 DIAGNOSIS — R718 Other abnormality of red blood cells: Secondary | ICD-10-CM

## 2017-03-22 DIAGNOSIS — Z87891 Personal history of nicotine dependence: Secondary | ICD-10-CM | POA: Diagnosis not present

## 2017-03-22 DIAGNOSIS — M8589 Other specified disorders of bone density and structure, multiple sites: Secondary | ICD-10-CM

## 2017-03-22 LAB — COMPREHENSIVE METABOLIC PANEL
ALT: 14 U/L (ref 14–54)
AST: 18 U/L (ref 15–41)
Albumin: 3.7 g/dL (ref 3.5–5.0)
Alkaline Phosphatase: 50 U/L (ref 38–126)
Anion gap: 7 (ref 5–15)
BUN: 19 mg/dL (ref 6–20)
CO2: 25 mmol/L (ref 22–32)
Calcium: 9.2 mg/dL (ref 8.9–10.3)
Chloride: 108 mmol/L (ref 101–111)
Creatinine, Ser: 0.85 mg/dL (ref 0.44–1.00)
GFR calc Af Amer: >60 mL/min (ref >60)
GFR calc non Af Amer: 59 mL/min — ABNORMAL LOW (ref >60)
Glucose, Bld: 103 mg/dL — ABNORMAL HIGH (ref 65–99)
Potassium: 3.6 mmol/L (ref 3.5–5.1)
Sodium: 140 mmol/L (ref 135–145)
Total Bilirubin: 0.5 mg/dL (ref 0.3–1.2)
Total Protein: 6.7 g/dL (ref 6.5–8.1)

## 2017-03-22 LAB — CBC WITH DIFFERENTIAL/PLATELET
Basophils Absolute: 0 10*3/uL (ref 0–0.1)
Basophils Relative: 0 %
Eosinophils Absolute: 0 10*3/uL (ref 0–0.7)
Eosinophils Relative: 0 %
HCT: 36.9 % (ref 35.0–47.0)
Hemoglobin: 11.9 g/dL — ABNORMAL LOW (ref 12.0–16.0)
Lymphocytes Relative: 28 %
Lymphs Abs: 1.1 10*3/uL (ref 1.0–3.6)
MCH: 25.3 pg — ABNORMAL LOW (ref 26.0–34.0)
MCHC: 32.3 g/dL (ref 32.0–36.0)
MCV: 78.2 fL — ABNORMAL LOW (ref 80.0–100.0)
Monocytes Absolute: 0.5 10*3/uL (ref 0.2–0.9)
Monocytes Relative: 13 %
Neutro Abs: 2.3 10*3/uL (ref 1.4–6.5)
Neutrophils Relative %: 59 %
Platelets: 186 10*3/uL (ref 150–440)
RBC: 4.71 MIL/uL (ref 3.80–5.20)
RDW: 17.2 % — ABNORMAL HIGH (ref 11.5–14.5)
WBC: 3.9 10*3/uL (ref 3.6–11.0)

## 2017-03-22 LAB — FERRITIN: Ferritin: 21 ng/mL (ref 11–307)

## 2017-03-22 MED ORDER — SODIUM CHLORIDE 0.9% FLUSH
10.0000 mL | INTRAVENOUS | Status: DC | PRN
  Administered 2017-03-22: 10 mL via INTRAVENOUS
  Filled 2017-03-22: qty 10

## 2017-03-22 MED ORDER — DENOSUMAB 60 MG/ML ~~LOC~~ SOLN
60.0000 mg | Freq: Once | SUBCUTANEOUS | Status: AC
  Administered 2017-03-22: 60 mg via SUBCUTANEOUS

## 2017-03-22 MED ORDER — HEPARIN SOD (PORK) LOCK FLUSH 100 UNIT/ML IV SOLN
500.0000 [IU] | Freq: Once | INTRAVENOUS | Status: AC
  Administered 2017-03-22: 500 [IU] via INTRAVENOUS

## 2017-03-22 MED ORDER — LETROZOLE 2.5 MG PO TABS: 2.5000 mg | ORAL_TABLET | Freq: Every day | ORAL | 1 refills | Status: DC

## 2017-03-22 NOTE — Progress Notes (Signed)
Patient states she feels like her right leg is "going to give out on her" at times.  Otherwise, no complaints today.  Patient accompanied by her son and daughter-in-law.

## 2017-03-23 LAB — CANCER ANTIGEN 27.29: CA 27.29: 26.8 U/mL (ref 0.0–38.6)

## 2017-03-23 LAB — CEA
CEA: 4.3 ng/mL (ref 0.0–4.7)
CEA: 4.3 ng/mL (ref 0.0–4.7)

## 2017-03-28 ENCOUNTER — Emergency Department: Payer: Medicare Other

## 2017-03-28 ENCOUNTER — Other Ambulatory Visit: Payer: Self-pay

## 2017-03-28 ENCOUNTER — Inpatient Hospital Stay
Admission: EM | Admit: 2017-03-28 | Discharge: 2017-04-05 | DRG: 082 | Disposition: A | Discharge status: Hospice | Payer: Medicare Other | Attending: Internal Medicine | Admitting: Internal Medicine

## 2017-03-28 DIAGNOSIS — I619 Nontraumatic intracerebral hemorrhage, unspecified: Secondary | ICD-10-CM | POA: Diagnosis not present

## 2017-03-28 DIAGNOSIS — R001 Bradycardia, unspecified: Secondary | ICD-10-CM | POA: Diagnosis present

## 2017-03-28 DIAGNOSIS — F0390 Unspecified dementia without behavioral disturbance: Secondary | ICD-10-CM

## 2017-03-28 DIAGNOSIS — I629 Nontraumatic intracranial hemorrhage, unspecified: Secondary | ICD-10-CM

## 2017-03-28 DIAGNOSIS — Z853 Personal history of malignant neoplasm of breast: Secondary | ICD-10-CM

## 2017-03-28 DIAGNOSIS — J189 Pneumonia, unspecified organism: Secondary | ICD-10-CM

## 2017-03-28 DIAGNOSIS — W19XXXA Unspecified fall, initial encounter: Secondary | ICD-10-CM | POA: Diagnosis not present

## 2017-03-28 DIAGNOSIS — I10 Essential (primary) hypertension: Secondary | ICD-10-CM | POA: Diagnosis not present

## 2017-03-28 DIAGNOSIS — E43 Unspecified severe protein-calorie malnutrition: Secondary | ICD-10-CM | POA: Diagnosis present

## 2017-03-28 DIAGNOSIS — Z9011 Acquired absence of right breast and nipple: Secondary | ICD-10-CM

## 2017-03-28 DIAGNOSIS — M6282 Rhabdomyolysis: Secondary | ICD-10-CM | POA: Diagnosis not present

## 2017-03-28 DIAGNOSIS — Z85528 Personal history of other malignant neoplasm of kidney: Secondary | ICD-10-CM

## 2017-03-28 DIAGNOSIS — Z7401 Bed confinement status: Secondary | ICD-10-CM | POA: Diagnosis not present

## 2017-03-28 DIAGNOSIS — Z90710 Acquired absence of both cervix and uterus: Secondary | ICD-10-CM

## 2017-03-28 DIAGNOSIS — R9401 Abnormal electroencephalogram [EEG]: Secondary | ICD-10-CM | POA: Diagnosis present

## 2017-03-28 DIAGNOSIS — R911 Solitary pulmonary nodule: Secondary | ICD-10-CM | POA: Diagnosis not present

## 2017-03-28 DIAGNOSIS — S06359A Traumatic hemorrhage of left cerebrum with loss of consciousness of unspecified duration, initial encounter: Secondary | ICD-10-CM | POA: Diagnosis not present

## 2017-03-28 DIAGNOSIS — Z7189 Other specified counseling: Secondary | ICD-10-CM | POA: Diagnosis not present

## 2017-03-28 DIAGNOSIS — Z6824 Body mass index (BMI) 24.0-24.9, adult: Secondary | ICD-10-CM | POA: Diagnosis not present

## 2017-03-28 DIAGNOSIS — R1310 Dysphagia, unspecified: Secondary | ICD-10-CM

## 2017-03-28 DIAGNOSIS — Z7982 Long term (current) use of aspirin: Secondary | ICD-10-CM

## 2017-03-28 DIAGNOSIS — R918 Other nonspecific abnormal finding of lung field: Secondary | ICD-10-CM | POA: Diagnosis not present

## 2017-03-28 DIAGNOSIS — E039 Hypothyroidism, unspecified: Secondary | ICD-10-CM | POA: Diagnosis not present

## 2017-03-28 DIAGNOSIS — Z66 Do not resuscitate: Secondary | ICD-10-CM | POA: Diagnosis not present

## 2017-03-28 DIAGNOSIS — F05 Delirium due to known physiological condition: Secondary | ICD-10-CM | POA: Diagnosis present

## 2017-03-28 DIAGNOSIS — G9349 Other encephalopathy: Secondary | ICD-10-CM | POA: Diagnosis present

## 2017-03-28 DIAGNOSIS — Z79899 Other long term (current) drug therapy: Secondary | ICD-10-CM

## 2017-03-28 DIAGNOSIS — Z515 Encounter for palliative care: Secondary | ICD-10-CM

## 2017-03-28 DIAGNOSIS — J701 Chronic and other pulmonary manifestations due to radiation: Secondary | ICD-10-CM | POA: Diagnosis present

## 2017-03-28 DIAGNOSIS — Z9221 Personal history of antineoplastic chemotherapy: Secondary | ICD-10-CM

## 2017-03-28 DIAGNOSIS — Z85118 Personal history of other malignant neoplasm of bronchus and lung: Secondary | ICD-10-CM | POA: Diagnosis not present

## 2017-03-28 DIAGNOSIS — I679 Cerebrovascular disease, unspecified: Secondary | ICD-10-CM | POA: Diagnosis present

## 2017-03-28 DIAGNOSIS — S0990XA Unspecified injury of head, initial encounter: Secondary | ICD-10-CM | POA: Diagnosis not present

## 2017-03-28 DIAGNOSIS — I639 Cerebral infarction, unspecified: Secondary | ICD-10-CM | POA: Diagnosis not present

## 2017-03-28 DIAGNOSIS — S299XXA Unspecified injury of thorax, initial encounter: Secondary | ICD-10-CM | POA: Diagnosis not present

## 2017-03-28 DIAGNOSIS — E876 Hypokalemia: Secondary | ICD-10-CM | POA: Diagnosis not present

## 2017-03-28 DIAGNOSIS — M79604 Pain in right leg: Secondary | ICD-10-CM | POA: Diagnosis not present

## 2017-03-28 DIAGNOSIS — Z79890 Hormone replacement therapy (postmenopausal): Secondary | ICD-10-CM

## 2017-03-28 DIAGNOSIS — Z87891 Personal history of nicotine dependence: Secondary | ICD-10-CM

## 2017-03-28 DIAGNOSIS — Y92009 Unspecified place in unspecified non-institutional (private) residence as the place of occurrence of the external cause: Secondary | ICD-10-CM

## 2017-03-28 DIAGNOSIS — R4020 Unspecified coma: Secondary | ICD-10-CM | POA: Diagnosis not present

## 2017-03-28 DIAGNOSIS — I612 Nontraumatic intracerebral hemorrhage in hemisphere, unspecified: Secondary | ICD-10-CM | POA: Diagnosis not present

## 2017-03-28 DIAGNOSIS — Y842 Radiological procedure and radiotherapy as the cause of abnormal reaction of the patient, or of later complication, without mention of misadventure at the time of the procedure: Secondary | ICD-10-CM | POA: Diagnosis present

## 2017-03-28 DIAGNOSIS — S199XXA Unspecified injury of neck, initial encounter: Secondary | ICD-10-CM | POA: Diagnosis not present

## 2017-03-28 DIAGNOSIS — Z803 Family history of malignant neoplasm of breast: Secondary | ICD-10-CM

## 2017-03-28 DIAGNOSIS — Z923 Personal history of irradiation: Secondary | ICD-10-CM

## 2017-03-28 DIAGNOSIS — Z79811 Long term (current) use of aromatase inhibitors: Secondary | ICD-10-CM

## 2017-03-28 DIAGNOSIS — R4701 Aphasia: Secondary | ICD-10-CM

## 2017-03-28 DIAGNOSIS — I6389 Other cerebral infarction: Secondary | ICD-10-CM | POA: Diagnosis not present

## 2017-03-28 LAB — URINALYSIS, COMPLETE (UACMP) WITH MICROSCOPIC
BACTERIA UA: NONE SEEN
BILIRUBIN URINE: NEGATIVE
Glucose, UA: NEGATIVE mg/dL
Hgb urine dipstick: NEGATIVE
Ketones, ur: 5 mg/dL — AB
Leukocytes, UA: NEGATIVE
Nitrite: NEGATIVE
Protein, ur: 30 mg/dL — AB
SPECIFIC GRAVITY, URINE: 1.024 (ref 1.005–1.030)
pH: 5 (ref 5.0–8.0)

## 2017-03-28 LAB — COMPREHENSIVE METABOLIC PANEL
ALBUMIN: 3.7 g/dL (ref 3.5–5.0)
ALT: 13 U/L — ABNORMAL LOW (ref 14–54)
AST: 26 U/L (ref 15–41)
Alkaline Phosphatase: 49 U/L (ref 38–126)
Anion gap: 8 (ref 5–15)
BUN: 23 mg/dL — AB (ref 6–20)
CHLORIDE: 109 mmol/L (ref 101–111)
CO2: 23 mmol/L (ref 22–32)
Calcium: 8.8 mg/dL — ABNORMAL LOW (ref 8.9–10.3)
Creatinine, Ser: 0.82 mg/dL (ref 0.44–1.00)
GFR calc Af Amer: >60 mL/min (ref >60)
GFR calc non Af Amer: >60 mL/min (ref >60)
GLUCOSE: 148 mg/dL — AB (ref 65–99)
POTASSIUM: 3.3 mmol/L — AB (ref 3.5–5.1)
Sodium: 140 mmol/L (ref 135–145)
Total Bilirubin: 0.7 mg/dL (ref 0.3–1.2)
Total Protein: 6.8 g/dL (ref 6.5–8.1)

## 2017-03-28 LAB — CBC
HEMATOCRIT: 36.4 % (ref 35.0–47.0)
Hemoglobin: 11.8 g/dL — ABNORMAL LOW (ref 12.0–16.0)
MCH: 25.3 pg — AB (ref 26.0–34.0)
MCHC: 32.3 g/dL (ref 32.0–36.0)
MCV: 78.4 fL — AB (ref 80.0–100.0)
Platelets: 176 10*3/uL (ref 150–440)
RBC: 4.64 MIL/uL (ref 3.80–5.20)
RDW: 17.1 % — AB (ref 11.5–14.5)
WBC: 8.7 10*3/uL (ref 3.6–11.0)

## 2017-03-28 LAB — CK: CK TOTAL: 882 U/L — AB (ref 38–234)

## 2017-03-28 MED ORDER — NICARDIPINE HCL IN NACL 20-0.86 MG/200ML-% IV SOLN
0.0000 mg/h | INTRAVENOUS | Status: DC
  Administered 2017-03-28: 0.5 mg/h via INTRAVENOUS
  Administered 2017-03-29: 4 mg/h via INTRAVENOUS
  Administered 2017-03-30: 2.5 mg/h via INTRAVENOUS
  Administered 2017-03-30 (×2): 5 mg/h via INTRAVENOUS
  Administered 2017-03-31 – 2017-04-01 (×4): 2.5 mg/h via INTRAVENOUS
  Filled 2017-03-28 (×10): qty 200

## 2017-03-28 MED ORDER — SODIUM CHLORIDE 0.9 % IV SOLN: 10.0000 mL/h | Freq: Once | INTRAVENOUS | Status: DC

## 2017-03-28 MED ORDER — SODIUM CHLORIDE 0.9 % IV SOLN
500.0000 mg | Freq: Two times a day (BID) | INTRAVENOUS | Status: DC
  Administered 2017-03-29 – 2017-04-01 (×8): 500 mg via INTRAVENOUS
  Filled 2017-03-28 (×10): qty 5

## 2017-03-28 MED ORDER — SODIUM CHLORIDE 0.9 % IV BOLUS (SEPSIS)
1000.0000 mL | Freq: Once | INTRAVENOUS | Status: AC
  Administered 2017-03-28: 1000 mL via INTRAVENOUS

## 2017-03-28 NOTE — ED Notes (Signed)
Called lab asking them to come draw labs since pt has been stuck multiple times by ED staff and EMS

## 2017-03-28 NOTE — ED Notes (Signed)
Pt cleaned of urine. Placed in gown. Wet belongings placed in bag.

## 2017-03-28 NOTE — ED Notes (Addendum)
Pt taken to scans via stretcher. Family at bedside.

## 2017-03-28 NOTE — ED Triage Notes (Signed)
Pt arrives [facility] from home where she lives by her self. LKW 7:30pm yesterday. Pt was found in floor, covered in urine. Unknown how long pt was on floor. Hx dementia. Pt has R breast CA. EMS stood pt up, pt said "ow" to R foot/hip. Pt did not moan to pain when moved from EMS to ED stretcher.

## 2017-03-28 NOTE — ED Provider Notes (Signed)
[facility] Emergency Department Provider Note  ____________________________________________  Time seen: Approximately 8:33 PM  I have reviewed the triage vital signs and the nursing notes.   HISTORY  Chief Complaint Fall  Level 5 caveat:  Portions of the history and physical were unable to be obtained due to: Altered mental status due to dementia   HPI Alexandra Glass is a 83 y.o. female brought to the ED from [facility] she lives by herself. She was found on the floor covered in urine, last known well 7:30 PM yesterday. Patient denies any complaints. Denies any pain or shortness of breath.     Past Medical History:  Diagnosis Date  . Breast cancer ([place])    right  . Hypertension   . Personal history of chemotherapy   . Personal history of radiation therapy   . Renal cancer ([place])   . Thyroid disease      Patient Active Problem List   Diagnosis Date Noted  . Microcytic red blood cells 03/08/2017  . Hypothyroidism 03/28/2016  . Osteopenia 03/21/2016  . Hypokalemia 12/09/2015  . 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 History of stroke 11/03/2015  . Essential hypertension 10/06/2015  . Cerebral vascular disease 10/06/2015  . TIA (transient ischemic attack) 09/27/2015  . Accelerated hypertension 09/27/2015  . Moderate dementia without behavioral disturbance 09/27/2015  . B12 deficiency 03/03/2015  . Amnesia 11/27/2014  . Breast cancer, right breast ([place]) 11/12/2014  . Cancer of upper lobe of right lung ([place]) 11/12/2014  . Bilateral kidney masses 11/12/2014  . Hyperlipidemia 10/09/2014     Past Surgical History:  Procedure Laterality Date  . APPENDECTOMY    . BREAST BIOPSY Left    neg  . BREAST SURGERY Right   . MASTECTOMY Right 02/04/11   chemo/rad- takes femara  . tumor surg     removed from L) side  . VAGINAL HYSTERECTOMY       Prior to Admission medications   Medication Sig Start Date End Date Taking? Authorizing Provider   aspirin 325 MG tablet Take 1 tablet (325 mg total) by mouth daily. 09/29/15   Hillary Bow, MD  atorvastatin (LIPITOR) 20 MG tablet Take 1 tablet (20 mg total) by mouth daily. 10/14/16   Juline Patch, MD  donepezil (ARICEPT) 10 MG tablet Take 10 mg by mouth at bedtime. Potter    [provider]  hydrochlorothiazide (HYDRODIURIL) 12.5 MG tablet Take 1 tablet (12.5 mg total) by mouth daily. 10/14/16   Juline Patch, MD  latanoprost (XALATAN) 0.005 % ophthalmic solution  01/20/16   [provider]  letrozole (FEMARA) 2.5 MG tablet Take 1 tablet (2.5 mg total) by mouth daily. 03/22/17   Karen Kitchens, NP  levothyroxine (SYNTHROID, LEVOTHROID) 75 MCG tablet TAKE (1) TABLET BY MOUTH EVERY DAY 10/14/16   Juline Patch, MD  lidocaine-prilocaine (EMLA) cream Apply 1 application topically as needed. 09/07/16   Lequita Asal, MD  memantine (NAMENDA) 5 MG tablet Take 5 mg by mouth 2 (two) times daily. Dr Melrose Nakayama 08/31/15 03/22/17  [provider]  potassium chloride (K-DUR) 10 MEQ tablet Take 1 tablet (10 mEq total) by mouth daily. 10/14/16   Juline Patch, MD  vitamin B-12 (CYANOCOBALAMIN) 1000 MCG tablet Take 1,000 mcg by mouth daily.    [provider]     Allergies No known allergies   Family History  Problem Relation Age of Onset  . Cancer Sister        lung  .  Cancer Sister        pancreas  . Cancer Sister        breast  . Breast cancer Sister     Social History Social History   Tobacco Use  . Smoking status: Former Smoker    Types: Cigarettes    Last attempt to quit: 12/17/1973    Years since quitting: 43.3  . Smokeless tobacco: Never Used  Substance Use Topics  . Alcohol use: No    Alcohol/week: 0.0 oz  . Drug use: No    Review of Systems Unable to reliably obtained due to altered mental status  ____________________________________________   PHYSICAL EXAM:  VITAL SIGNS: ED Triage Vitals [03/28/17 2020]  Enc Vitals Group      BP (!) 156/83     Pulse Rate 79     Resp 20     Temp 98.4 F (36.9 C)     Temp Source Oral     SpO2 98 %     Weight      Height      Head Circumference      Peak Flow      Pain Score      Pain Loc      Pain Edu?      Excl. in [place]?     Vital signs reviewed, nursing assessments reviewed.   Constitutional:  Awake and alert, not oriented Eyes:   No scleral icterus.  EOMI. No nystagmus. No conjunctival pallor. PERRL. ENT   Head:   Normocephalic and atraumatic.   Nose:   No congestion/rhinnorhea.    Mouth/Throat:   MMM, no pharyngeal erythema. No peritonsillar mass.    Neck:   No meningismus. Full ROM. No midline tenderness Hematological/Lymphatic/Immunilogical:   No cervical lymphadenopathy. Cardiovascular:   RRR. Symmetric bilateral radial and DP pulses.  No murmurs.  Respiratory:   Normal respiratory effort without tachypnea/retractions. Breath sounds are clear and equal bilaterally. No wheezes/rales/rhonchi. Gastrointestinal:   Soft and nontender. Non distended. There is no CVA tenderness.  No rebound, rigidity, or guarding. Genitourinary:   deferred Musculoskeletal:   Normal range of motion in all extremities. No joint effusions.  No lower extremity tenderness.  No edema. Neurologic:   Normal speech , limited language range. Motor grossly intact. No acute focal neurologic deficits are appreciated.  Skin:    Skin is warm, dry and intact. No rash noted.  No petechiae, purpura, or bullae.  ____________________________________________    LABS (pertinent positives/negatives) (all labs ordered are listed, but only abnormal results are displayed) Labs Reviewed  COMPREHENSIVE METABOLIC PANEL - Abnormal; Notable for the following components:      Result Value   Potassium 3.3 (*)    Glucose, Bld 148 (*)    BUN 23 (*)    Calcium 8.8 (*)    ALT 13 (*)    All other components within normal limits  CBC - Abnormal; Notable for the following components:   Hemoglobin  11.8 (*)    MCV 78.4 (*)    MCH 25.3 (*)    RDW 17.1 (*)    All other components within normal limits  URINALYSIS, COMPLETE (UACMP) WITH MICROSCOPIC - Abnormal; Notable for the following components:   Color, Urine YELLOW (*)    APPearance CLEAR (*)    Ketones, ur 5 (*)    Protein, ur 30 (*)    Squamous Epithelial / LPF 0-5 (*)    All other components within normal limits  CK - Abnormal; Notable for  the following components:   Total CK 882 (*)    All other components within normal limits  URINE CULTURE  CBG MONITORING, ED  PREPARE PLATELET PHERESIS   ____________________________________________   EKG  Interpreted by me Atrial fibrillation rate of 77, left axis, normal intervals. Normal QRS ST segments and T waves.  ____________________________________________    RADIOLOGY  Ct Head Wo Contrast  Result Date: 03/28/2017 CLINICAL DATA:  Patient found down today. History of breast carcinoma. EXAM: CT HEAD WITHOUT CONTRAST CT CERVICAL SPINE WITHOUT CONTRAST TECHNIQUE: Multidetector CT imaging of the head and cervical spine was performed following the standard protocol without intravenous contrast. Multiplanar CT image reconstructions of the cervical spine were also generated. COMPARISON:  Head CT scan 11/27/2015. Brain MRI 09/28/2015. PET CT scan 05/24/2012. FINDINGS: CT HEAD FINDINGS Brain: A large hematoma is identified in the left frontal lobe measuring approximately 3.1 cm craniocaudal by 3.3 cm AP by 1.8 cm transverse for a volume of 9 cc. A small tiny amount of hemorrhage is seen in the anterior aspect of the third ventricle or foraminal of [place]ro. There is a small amount of subarachnoid hemorrhage over the left frontal convexities. The patient's hematoma results in approximately 0.7 cm left right midline shift. There is atrophy and chronic microvascular ischemic change. Remote left PCA infarct is noted. Vascular: Extensive atherosclerosis is seen. Skull: Intact without focal lesion.  Sinuses/Orbits: Negative. Other: None. CT CERVICAL SPINE FINDINGS Alignment: Maintained. Skull base and vertebrae: No acute fracture. No primary bone lesion or focal pathologic process. Soft tissues and spinal canal: No prevertebral fluid or swelling. No visible canal hematoma. Disc levels: Loss of disc space height and endplate spurring are worst at C6-7. Upper chest: Emphysematous change is seen in the apices. There is partial visualization of biapical opacities. Other: None. IMPRESSION: Large intraparenchymal hematoma in the left frontal lobe. Underlying mass lesion is possible. 0.7 cm left right midline shift is present. Atrophy and extensive chronic microvascular ischemic change. Remote left MCA infarct noted. Partial visualization of biapical opacities. Correlation with PA and lateral chest is recommended. Emphysema. Critical Value/emergent results were called by telephone at the time of interpretation on 03/28/2017 at 9:19 pm to Dr. Carrie Mew , who verbally acknowledged these results. Electronically Signed   By: Inge Rise M.D.   On: 03/28/2017 21:22   Ct Cervical Spine Wo Contrast  Result Date: 03/28/2017 CLINICAL DATA:  Patient found down today. History of breast carcinoma. EXAM: CT HEAD WITHOUT CONTRAST CT CERVICAL SPINE WITHOUT CONTRAST TECHNIQUE: Multidetector CT imaging of the head and cervical spine was performed following the standard protocol without intravenous contrast. Multiplanar CT image reconstructions of the cervical spine were also generated. COMPARISON:  Head CT scan 11/27/2015. Brain MRI 09/28/2015. PET CT scan 05/24/2012. FINDINGS: CT HEAD FINDINGS Brain: A large hematoma is identified in the left frontal lobe measuring approximately 3.1 cm craniocaudal by 3.3 cm AP by 1.8 cm transverse for a volume of 9 cc. A small tiny amount of hemorrhage is seen in the anterior aspect of the third ventricle or foraminal of Monro. There is a small amount of subarachnoid hemorrhage over  the left frontal convexities. The patient's hematoma results in approximately 0.7 cm left right midline shift. There is atrophy and chronic microvascular ischemic change. Remote left PCA infarct is noted. Vascular: Extensive atherosclerosis is seen. Skull: Intact without focal lesion. Sinuses/Orbits: Negative. Other: None. CT CERVICAL SPINE FINDINGS Alignment: Maintained. Skull base and vertebrae: No acute fracture. No primary bone lesion or focal  pathologic process. Soft tissues and spinal canal: No prevertebral fluid or swelling. No visible canal hematoma. Disc levels: Loss of disc space height and endplate spurring are worst at C6-7. Upper chest: Emphysematous change is seen in the apices. There is partial visualization of biapical opacities. Other: None. IMPRESSION: Large intraparenchymal hematoma in the left frontal lobe. Underlying mass lesion is possible. 0.7 cm left right midline shift is present. Atrophy and extensive chronic microvascular ischemic change. Remote left MCA infarct noted. Partial visualization of biapical opacities. Correlation with PA and lateral chest is recommended. Emphysema. Critical Value/emergent results were called by telephone at the time of interpretation on 03/28/2017 at 9:19 pm to Dr. Carrie Mew , who verbally acknowledged these results. Electronically Signed   By: Inge Rise M.D.   On: 03/28/2017 21:22    ____________________________________________   PROCEDURES Procedures CRITICAL CARE Performed by: Joni Fears, Azariyah Luhrs   Total critical care time: 35 minutes  Critical care time was exclusive of separately billable procedures and treating other patients.  Critical care was necessary to treat or prevent imminent or life-threatening deterioration.  Critical care was time spent personally by me on the following activities: development of treatment plan with patient and/or surrogate as well as nursing, discussions with consultants, evaluation of patient's  response to treatment, examination of patient, obtaining history from patient or surrogate, ordering and performing treatments and interventions, ordering and review of laboratory studies, ordering and review of radiographic studies, pulse oximetry and re-evaluation of patient's condition.  ____________________________________________   DIFFERENTIAL DIAGNOSIS  Subdural hematoma, epidural hematoma, stroke, dehydration, urinary tract infection, likely disturbance, rhabdomyolysis  CLINICAL IMPRESSION / ASSESSMENT AND PLAN / ED COURSE  Pertinent labs & imaging results that were available during my care of the patient were reviewed by me and considered in my medical decision making (see chart for details).   Patient presents with altered mental status, unclear if this is baseline, accompanied with being found down unable to get up off the floor covered in urine. We'll obtain CT scan of the head, chest x-ray, labs and IV fluids for hydration.  Clinical Course as of Mar 28 2309  Tue Mar 28, 2017  2101 CT images reviewed. Shows sizeable intraparenchymal hemorrhage in left frontal lobe. Will add CT c-spine. Will d/w nsgy. Given advanced dementia, pt is unlikely to be interventional candidate.   [PS]    Clinical Course User Index [PS] Carrie Mew, MD    ----------------------------------------- 11:11 PM on 03/28/2017 -----------------------------------------  Discussed with neurosurgery Dr. Lacinda Axon. Recommends Keppra 500 twice a day, blood pressure management with target systolic less than 563, transfuse platelets given she's been on aspirin. Had a very long discussion with the family from more than 15 minutes regarding goals of care. Inform them that she would not be considered a surgical candidate because risk of procedure would far outweigh any benefit.   They feel that given the circumstances, if she should have any worsening of condition requiring potential intubation to prolong her  life, she would not want this and they would not want her to be intubated. They agreed that at this time CODE STATUS should be DO NOT RESUSCITATE at this time. Given all this, there is no benefit for transfer to tertiary medical center, and patient will be admitted to ICU here at [facility]. Case discussed with hospitalist. Medications ordered. Family interested in palliative care evaluation.  Continue IV fluids for elevated CK with prolonged downtime on the floor. ____________________________________________   FINAL CLINICAL IMPRESSION(S) / ED DIAGNOSES  Final diagnoses:  Intracranial hemorrhage ([place])  Chronic dementia without behavioral disturbance  Non-traumatic rhabdomyolysis      This SmartLink is deprecated. Use AVSMEDLIST instead to display the medication list for a patient.   Portions of this note were generated with dragon dictation software. Dictation errors may occur despite best attempts at proofreading.    Carrie Mew, MD 03/28/17 [phone]

## 2017-03-29 ENCOUNTER — Other Ambulatory Visit: Payer: Self-pay

## 2017-03-29 DIAGNOSIS — Z90710 Acquired absence of both cervix and uterus: Secondary | ICD-10-CM | POA: Diagnosis not present

## 2017-03-29 DIAGNOSIS — Z85118 Personal history of other malignant neoplasm of bronchus and lung: Secondary | ICD-10-CM | POA: Diagnosis not present

## 2017-03-29 DIAGNOSIS — R4701 Aphasia: Secondary | ICD-10-CM | POA: Diagnosis present

## 2017-03-29 DIAGNOSIS — Z66 Do not resuscitate: Secondary | ICD-10-CM | POA: Diagnosis present

## 2017-03-29 DIAGNOSIS — I612 Nontraumatic intracerebral hemorrhage in hemisphere, unspecified: Secondary | ICD-10-CM | POA: Diagnosis not present

## 2017-03-29 DIAGNOSIS — W19XXXA Unspecified fall, initial encounter: Secondary | ICD-10-CM | POA: Diagnosis present

## 2017-03-29 DIAGNOSIS — Z7189 Other specified counseling: Secondary | ICD-10-CM | POA: Diagnosis not present

## 2017-03-29 DIAGNOSIS — I10 Essential (primary) hypertension: Secondary | ICD-10-CM | POA: Diagnosis present

## 2017-03-29 DIAGNOSIS — F05 Delirium due to known physiological condition: Secondary | ICD-10-CM | POA: Diagnosis present

## 2017-03-29 DIAGNOSIS — J701 Chronic and other pulmonary manifestations due to radiation: Secondary | ICD-10-CM | POA: Diagnosis present

## 2017-03-29 DIAGNOSIS — Y842 Radiological procedure and radiotherapy as the cause of abnormal reaction of the patient, or of later complication, without mention of misadventure at the time of the procedure: Secondary | ICD-10-CM | POA: Diagnosis present

## 2017-03-29 DIAGNOSIS — I629 Nontraumatic intracranial hemorrhage, unspecified: Secondary | ICD-10-CM | POA: Diagnosis not present

## 2017-03-29 DIAGNOSIS — F0390 Unspecified dementia without behavioral disturbance: Secondary | ICD-10-CM | POA: Diagnosis present

## 2017-03-29 DIAGNOSIS — G9349 Other encephalopathy: Secondary | ICD-10-CM | POA: Diagnosis present

## 2017-03-29 DIAGNOSIS — R1310 Dysphagia, unspecified: Secondary | ICD-10-CM | POA: Diagnosis present

## 2017-03-29 DIAGNOSIS — Z9011 Acquired absence of right breast and nipple: Secondary | ICD-10-CM | POA: Diagnosis not present

## 2017-03-29 DIAGNOSIS — E876 Hypokalemia: Secondary | ICD-10-CM | POA: Diagnosis present

## 2017-03-29 DIAGNOSIS — Z515 Encounter for palliative care: Secondary | ICD-10-CM | POA: Diagnosis present

## 2017-03-29 DIAGNOSIS — Z6824 Body mass index (BMI) 24.0-24.9, adult: Secondary | ICD-10-CM | POA: Diagnosis not present

## 2017-03-29 DIAGNOSIS — Z853 Personal history of malignant neoplasm of breast: Secondary | ICD-10-CM | POA: Diagnosis not present

## 2017-03-29 DIAGNOSIS — R001 Bradycardia, unspecified: Secondary | ICD-10-CM | POA: Diagnosis present

## 2017-03-29 DIAGNOSIS — R9401 Abnormal electroencephalogram [EEG]: Secondary | ICD-10-CM | POA: Diagnosis present

## 2017-03-29 DIAGNOSIS — R918 Other nonspecific abnormal finding of lung field: Secondary | ICD-10-CM | POA: Diagnosis present

## 2017-03-29 DIAGNOSIS — Z9221 Personal history of antineoplastic chemotherapy: Secondary | ICD-10-CM | POA: Diagnosis not present

## 2017-03-29 DIAGNOSIS — E43 Unspecified severe protein-calorie malnutrition: Secondary | ICD-10-CM | POA: Diagnosis present

## 2017-03-29 DIAGNOSIS — I679 Cerebrovascular disease, unspecified: Secondary | ICD-10-CM | POA: Diagnosis not present

## 2017-03-29 DIAGNOSIS — M6282 Rhabdomyolysis: Secondary | ICD-10-CM | POA: Diagnosis present

## 2017-03-29 DIAGNOSIS — S06359A Traumatic hemorrhage of left cerebrum with loss of consciousness of unspecified duration, initial encounter: Secondary | ICD-10-CM | POA: Diagnosis present

## 2017-03-29 DIAGNOSIS — Z85528 Personal history of other malignant neoplasm of kidney: Secondary | ICD-10-CM | POA: Diagnosis not present

## 2017-03-29 DIAGNOSIS — E039 Hypothyroidism, unspecified: Secondary | ICD-10-CM | POA: Diagnosis present

## 2017-03-29 DIAGNOSIS — Y92009 Unspecified place in unspecified non-institutional (private) residence as the place of occurrence of the external cause: Secondary | ICD-10-CM | POA: Diagnosis not present

## 2017-03-29 LAB — BASIC METABOLIC PANEL
ANION GAP: 8 (ref 5–15)
BUN: 19 mg/dL (ref 6–20)
CALCIUM: 8.4 mg/dL — AB (ref 8.9–10.3)
CHLORIDE: 109 mmol/L (ref 101–111)
CO2: 23 mmol/L (ref 22–32)
CREATININE: 0.68 mg/dL (ref 0.44–1.00)
GFR calc non Af Amer: >60 mL/min (ref >60)
Glucose, Bld: 128 mg/dL — ABNORMAL HIGH (ref 65–99)
Potassium: 3.1 mmol/L — ABNORMAL LOW (ref 3.5–5.1)
SODIUM: 140 mmol/L (ref 135–145)

## 2017-03-29 LAB — POTASSIUM: POTASSIUM: 3.7 mmol/L (ref 3.5–5.1)

## 2017-03-29 LAB — ABO/RH: ABO/RH(D): O POS

## 2017-03-29 LAB — GLUCOSE, CAPILLARY: Glucose-Capillary: 96 mg/dL (ref 65–99)

## 2017-03-29 LAB — MAGNESIUM: MAGNESIUM: 2.1 mg/dL (ref 1.7–2.4)

## 2017-03-29 LAB — MRSA PCR SCREENING: MRSA by PCR: NEGATIVE

## 2017-03-29 MED ORDER — HYDROCHLOROTHIAZIDE 25 MG PO TABS
12.5000 mg | ORAL_TABLET | Freq: Every day | ORAL | Status: DC
  Administered 2017-03-29 – 2017-03-30 (×2): 12.5 mg via ORAL
  Filled 2017-03-29 (×2): qty 1

## 2017-03-29 MED ORDER — ORAL CARE MOUTH RINSE
15.0000 mL | Freq: Two times a day (BID) | OROMUCOSAL | Status: DC
  Administered 2017-03-29 – 2017-04-02 (×5): 15 mL via OROMUCOSAL

## 2017-03-29 MED ORDER — STROKE: EARLY STAGES OF RECOVERY BOOK
Freq: Once | Status: AC
  Administered 2017-03-29: 05:00:00

## 2017-03-29 MED ORDER — METOPROLOL SUCCINATE ER 50 MG PO TB24
25.0000 mg | ORAL_TABLET | Freq: Every day | ORAL | Status: DC
  Administered 2017-03-29: 25 mg via ORAL
  Filled 2017-03-29: qty 1

## 2017-03-29 MED ORDER — CHLORHEXIDINE GLUCONATE 0.12 % MT SOLN
15.0000 mL | Freq: Two times a day (BID) | OROMUCOSAL | Status: DC
  Administered 2017-03-29 – 2017-04-02 (×9): 15 mL via OROMUCOSAL
  Filled 2017-03-29 (×11): qty 15

## 2017-03-29 MED ORDER — ACETAMINOPHEN 325 MG PO TABS: 650.0000 mg | ORAL_TABLET | ORAL | Status: DC | PRN

## 2017-03-29 MED ORDER — ACETAMINOPHEN 650 MG RE SUPP: 650.0000 mg | RECTAL | Status: DC | PRN

## 2017-03-29 MED ORDER — POTASSIUM CHLORIDE 10 MEQ/100ML IV SOLN
10.0000 meq | INTRAVENOUS | Status: AC
  Administered 2017-03-29 (×4): 10 meq via INTRAVENOUS
  Filled 2017-03-29 (×4): qty 100

## 2017-03-29 MED ORDER — ACETAMINOPHEN 160 MG/5ML PO SOLN: 650.0000 mg | ORAL | Status: DC | PRN

## 2017-03-29 MED ORDER — PANTOPRAZOLE SODIUM 40 MG IV SOLR
40.0000 mg | Freq: Every day | INTRAVENOUS | Status: DC
  Administered 2017-03-29 – 2017-04-01 (×4): 40 mg via INTRAVENOUS
  Filled 2017-03-29 (×4): qty 40

## 2017-03-29 NOTE — Consult Note (Signed)
Name: Alexandra Glass MRN: [id number] DOB: September 18, 1924    ADMISSION DATE:  03/28/2017  CONSULTATION DATE:  03/29/17  REFERRING MD :  Dr. Jannifer Franklin  CHIEF COMPLAINT:  Fall  BRIEF PATIENT DESCRIPTION: 83 year old female with Large intraparenchymal hematoma in the left frontal lobe  SIGNIFICANT EVENTS  12/12 Patient admitted to the ICU with left ICH  STUDIES:  12/12 CT head and spine>>Large intraparenchymal hematoma in the left frontal lobe. Underlying mass lesion is possible. 0.7 cm left right midline shift is present.Atrophy and extensive chronic microvascular ischemic change. Remote left MCA infarct noted.   HISTORY OF PRESENT ILLNESS:  Alexandra Glass is a 83 year old female with known history of breast cancer, stage -3 lung cancer and thyroid disease. Apparently  Patient lives by herself . Family members tried to reach her by phone and were unable to reach her .  She was found on the floor covered in her urine. According to the family member she was seen in her usual state of health on 12/10.  Upon arrival to the ED she was sent to get a CT of her head which was concerning for large left frontal lobe hematoma.  She is not a surgical candidate as per the Neurosurgeon at [facility] and needs to be managed medically.  Patient was sent to the ICU for closer monitoring.  PAST MEDICAL HISTORY :   has a past medical history of Breast cancer ([place]), Hypertension, Personal history of chemotherapy, Personal history of radiation therapy, Renal cancer ([place]), and Thyroid disease.  has a past surgical history that includes tumor surg; Appendectomy; Breast surgery (Right); Vaginal hysterectomy; Breast biopsy (Left); and Mastectomy (Right, 02/04/11). Prior to Admission medications   Medication Sig Start Date End Date Taking? Authorizing Provider  aspirin 325 MG tablet Take 1 tablet (325 mg total) by mouth daily. 09/29/15  Yes Sudini, Alveta Heimlich, MD  atorvastatin (LIPITOR) 20 MG tablet Take 1 tablet (20 mg total) by mouth  daily. 10/14/16  Yes Juline Patch, MD  donepezil (ARICEPT) 10 MG tablet Take 10 mg by mouth at bedtime. Potter   Yes [provider]  hydrochlorothiazide (HYDRODIURIL) 12.5 MG tablet Take 1 tablet (12.5 mg total) by mouth daily. 10/14/16  Yes Juline Patch, MD  latanoprost (XALATAN) 0.005 % ophthalmic solution Place 1 drop into both eyes at bedtime.  01/20/16  Yes [provider]  letrozole (FEMARA) 2.5 MG tablet Take 1 tablet (2.5 mg total) by mouth daily. 03/22/17  Yes Karen Kitchens, NP  levothyroxine (SYNTHROID, LEVOTHROID) 75 MCG tablet TAKE (1) TABLET BY MOUTH EVERY DAY 10/14/16  Yes Juline Patch, MD  lidocaine-prilocaine (EMLA) cream Apply 1 application topically as needed. 09/07/16  Yes Corcoran, Drue Second, MD  memantine (NAMENDA) 5 MG tablet Take 5 mg by mouth 2 (two) times daily. Dr Melrose Nakayama 08/31/15 03/28/17 Yes [provider]  potassium chloride (K-DUR) 10 MEQ tablet Take 1 tablet (10 mEq total) by mouth daily. 10/14/16  Yes Juline Patch, MD  vitamin B-12 (CYANOCOBALAMIN) 1000 MCG tablet Take 1,000 mcg by mouth daily.   Yes [provider]   Allergies  Allergen Reactions  . No Known Allergies     FAMILY HISTORY:  family history includes Breast cancer in her sister; Cancer in her sister, sister, and sister. SOCIAL HISTORY:  reports that she quit smoking about 43 years ago. Her smoking use included cigarettes. she has never used smokeless tobacco. She reports that she does not drink alcohol or use  drugs.  SUBJECTIVE: Patient responds to her name  VITAL SIGNS: Temp:  [98.4 F (36.9 C)] 98.4 F (36.9 C) (12/11 2316) Pulse Rate:  [71-79] 76 (12/12 0100) Resp:  [13-24] 16 (12/12 0100) BP: (141-166)/(64-86) 146/64 (12/12 0100) SpO2:  [97 %-100 %] 98 % (12/12 0100)  PHYSICAL EXAMINATION: General:  Elderly, female Neuro:  Responds to name and follows command occassionally HEENT: AT,[place],No jvd Cardiovascular:  s1s2,regular Lungs:  Diminished  , no wheezes,crackles and rhonchi Abdomen:  Soft,NT,ND Musculoskeletal:  No edema,cyanosis Skin:  Vetiligo,warm,dry and intact  Recent Labs  Lab 03/22/17 1105 03/28/17 2200  NA 140 140  K 3.6 3.3*  CL 108 109  CO2 25 23  BUN 19 23*  CREATININE 0.85 0.82  GLUCOSE 103* 148*   Recent Labs  Lab 03/22/17 1105 03/28/17 2200  HGB 11.9* 11.8*  HCT 36.9 36.4  WBC 3.9 8.7  PLT 186 176   Ct Head Wo Contrast  Result Date: 03/28/2017 CLINICAL DATA:  Patient found down today. History of breast carcinoma. EXAM: CT HEAD WITHOUT CONTRAST CT CERVICAL SPINE WITHOUT CONTRAST TECHNIQUE: Multidetector CT imaging of the head and cervical spine was performed following the standard protocol without intravenous contrast. Multiplanar CT image reconstructions of the cervical spine were also generated. COMPARISON:  Head CT scan 11/27/2015. Brain MRI 09/28/2015. PET CT scan 05/24/2012. FINDINGS: CT HEAD FINDINGS Brain: A large hematoma is identified in the left frontal lobe measuring approximately 3.1 cm craniocaudal by 3.3 cm AP by 1.8 cm transverse for a volume of 9 cc. A small tiny amount of hemorrhage is seen in the anterior aspect of the third ventricle or foraminal of Monro. There is a small amount of subarachnoid hemorrhage over the left frontal convexities. The patient's hematoma results in approximately 0.7 cm left right midline shift. There is atrophy and chronic microvascular ischemic change. Remote left PCA infarct is noted. Vascular: Extensive atherosclerosis is seen. Skull: Intact without focal lesion. Sinuses/Orbits: Negative. Other: None. CT CERVICAL SPINE FINDINGS Alignment: Maintained. Skull base and vertebrae: No acute fracture. No primary bone lesion or focal pathologic process. Soft tissues and spinal canal: No prevertebral fluid or swelling. No visible canal hematoma. Disc levels: Loss of disc space height and endplate spurring are worst at C6-7. Upper chest: Emphysematous change is seen in the  apices. There is partial visualization of biapical opacities. Other: None. IMPRESSION: Large intraparenchymal hematoma in the left frontal lobe. Underlying mass lesion is possible. 0.7 cm left right midline shift is present. Atrophy and extensive chronic microvascular ischemic change. Remote left MCA infarct noted. Partial visualization of biapical opacities. Correlation with PA and lateral chest is recommended. Emphysema. Critical Value/emergent results were called by telephone at the time of interpretation on 03/28/2017 at 9:19 pm to Dr. Carrie Mew , who verbally acknowledged these results. Electronically Signed   By: Inge Rise M.D.   On: 03/28/2017 21:22   Ct Cervical Spine Wo Contrast  Result Date: 03/28/2017 CLINICAL DATA:  Patient found down today. History of breast carcinoma. EXAM: CT HEAD WITHOUT CONTRAST CT CERVICAL SPINE WITHOUT CONTRAST TECHNIQUE: Multidetector CT imaging of the head and cervical spine was performed following the standard protocol without intravenous contrast. Multiplanar CT image reconstructions of the cervical spine were also generated. COMPARISON:  Head CT scan 11/27/2015. Brain MRI 09/28/2015. PET CT scan 05/24/2012. FINDINGS: CT HEAD FINDINGS Brain: A large hematoma is identified in the left frontal lobe measuring approximately 3.1 cm craniocaudal by 3.3 cm AP by 1.8 cm transverse for a volume  of 9 cc. A small tiny amount of hemorrhage is seen in the anterior aspect of the third ventricle or foraminal of Monro. There is a small amount of subarachnoid hemorrhage over the left frontal convexities. The patient's hematoma results in approximately 0.7 cm left right midline shift. There is atrophy and chronic microvascular ischemic change. Remote left PCA infarct is noted. Vascular: Extensive atherosclerosis is seen. Skull: Intact without focal lesion. Sinuses/Orbits: Negative. Other: None. CT CERVICAL SPINE FINDINGS Alignment: Maintained. Skull base and vertebrae: No  acute fracture. No primary bone lesion or focal pathologic process. Soft tissues and spinal canal: No prevertebral fluid or swelling. No visible canal hematoma. Disc levels: Loss of disc space height and endplate spurring are worst at C6-7. Upper chest: Emphysematous change is seen in the apices. There is partial visualization of biapical opacities. Other: None. IMPRESSION: Large intraparenchymal hematoma in the left frontal lobe. Underlying mass lesion is possible. 0.7 cm left right midline shift is present. Atrophy and extensive chronic microvascular ischemic change. Remote left MCA infarct noted. Partial visualization of biapical opacities. Correlation with PA and lateral chest is recommended. Emphysema. Critical Value/emergent results were called by telephone at the time of interpretation on 03/28/2017 at 9:19 pm to Dr. Carrie Mew , who verbally acknowledged these results. Electronically Signed   By: Inge Rise M.D.   On: 03/28/2017 21:22    ASSESSMENT / PLAN:  Large intraparenchymal hematoma in the left frontal lobe Essential hypertension   Plan Continue I/V nicardipine to keep SBP<137mmof Hg Platelet transfusion per Neurosurgery recommendation as the patient is on plavix and aspirin Continue Keppra Neurochecks every 4 hours F/u with MRI Neurology consulted Seizure precautions Hold po meds  Krista Godsil,AG-ACNP Pulmonary and [place]   03/29/2017, 1:15 AM

## 2017-03-29 NOTE — Progress Notes (Signed)
OT Cancellation Note  Patient Details Name: Alexandra Glass MRN: [id number] DOB: [birth year]   Cancelled Treatment:    Reason Eval/Treat Not Completed: Other (comment). Order received, chart reviewed. CT scan indicates large intraparenchymal hematoma in the left frontal lobe, underlying mass lesion is possible, and 0.7 cm left right midline shift. MRI pending, neurology consult pending. Will hold OT evaluation this morning and re-attempt at later date/time, pending updated plan of care.  Jeni Salles, MPH, MS, OTR/L ascom [phone] 03/29/17, 8:14 AM

## 2017-03-29 NOTE — Progress Notes (Signed)
PT Cancellation Note  Patient Details Name: Alexandra Glass MRN: [id number] DOB: 29-Jul-1924   Cancelled Treatment:    Reason Eval/Treat Not Completed: Medical issues which prohibited therapy. Order received, chart reviewed. CT scan indicates large intraparenchymal hematoma in the left frontal lobe, underlying mass lesion is possible, and 0.7 cm left right midline shift. MRI pending, neurology consult pending. Will hold PT evaluation this morning and re-attempt at later date/time, pending updated plan of care.  Collie Siad PT, DPT 03/29/2017, 8:16 AM

## 2017-03-29 NOTE — Progress Notes (Signed)
Pharmacy Electrolyte Monitoring Consult:  Pharmacy consulted to assist in monitoring and replacing electrolytes in this 83 y.o. female admitted on 03/28/2017 with Fall   Labs:  Sodium (mmol/L)  Date Value  03/29/2017 140  10/14/2016 147 (H)  08/06/2014 137   Potassium (mmol/L)  Date Value  03/29/2017 3.1 (L)  08/06/2014 3.3 (L)   Magnesium (mg/dL)  Date Value  03/29/2017 2.1   Phosphorus (mg/dL)  Date Value  10/14/2016 3.8   Calcium (mg/dL)  Date Value  03/29/2017 8.4 (L)   Calcium, Total (mg/dL)  Date Value  08/06/2014 9.3   Albumin (g/dL)  Date Value  03/28/2017 3.7  10/14/2016 4.4  08/06/2014 3.7    Plan: KCl 10 meq IV x 4 runs and f/u AM labs.   Ulice Dash D 03/29/2017 4:02 PM

## 2017-03-29 NOTE — H&P (Signed)
[facility] [place] NAME: Alexandra Glass    MR#:  [id number]  DATE OF BIRTH:  1924/10/08  DATE OF ADMISSION:  03/28/2017  PRIMARY CARE PHYSICIAN: Juline Patch, MD   REQUESTING/REFERRING PHYSICIAN: Joni Fears, MD  CHIEF COMPLAINT:   Chief Complaint  Patient presents with  . Fall    HISTORY OF PRESENT ILLNESS:  Alexandra Glass  is a 83 y.o. female who presents with responsive state.  Family at bedside provides the history as the patient is unable.  Family states that her last known well was the night prior to presentation to the ED.  Family member tried to call her the next morning and she did not answer, so different family member went by the house to see her and found her down and unresponsive.  Patient's family found her at her home and states that they feel that she fell sometime in the morning, as it appeared that she had slept in her bed the night before.  Here in the ED she was found to have a right frontal intracerebral hemorrhage.  Neurosurgery stated that this was not intervenable, and recommended medical management in the ICU.  Hospitalist were called for admission  PAST MEDICAL HISTORY:   Past Medical History:  Diagnosis Date  . Breast cancer ([place])    right  . Hypertension   . Personal history of chemotherapy   . Personal history of radiation therapy   . Renal cancer ([place])   . Thyroid disease     PAST SURGICAL HISTORY:   Past Surgical History:  Procedure Laterality Date  . APPENDECTOMY    . BREAST BIOPSY Left    neg  . BREAST SURGERY Right   . MASTECTOMY Right 02/04/11   chemo/rad- takes femara  . tumor surg     removed from L) side  . VAGINAL HYSTERECTOMY      SOCIAL HISTORY:   Social History   Tobacco Use  . Smoking status: Former Smoker    Types: Cigarettes    Last attempt to quit: 12/17/1973    Years since quitting: 43.3  . Smokeless tobacco: Never Used  Substance Use Topics  . Alcohol use: No     Alcohol/week: 0.0 oz    FAMILY HISTORY:   Family History  Problem Relation Age of Onset  . Cancer Sister        lung  . Cancer Sister        pancreas  . Cancer Sister        breast  . Breast cancer Sister     DRUG ALLERGIES:   Allergies  Allergen Reactions  . No Known Allergies     MEDICATIONS AT HOME:   Prior to Admission medications   Medication Sig Start Date End Date Taking? Authorizing Provider  aspirin 325 MG tablet Take 1 tablet (325 mg total) by mouth daily. 09/29/15  Yes Sudini, Alveta Heimlich, MD  atorvastatin (LIPITOR) 20 MG tablet Take 1 tablet (20 mg total) by mouth daily. 10/14/16  Yes Juline Patch, MD  donepezil (ARICEPT) 10 MG tablet Take 10 mg by mouth at bedtime. [place]   Yes [provider]  hydrochlorothiazide (HYDRODIURIL) 12.5 MG tablet Take 1 tablet (12.5 mg total) by mouth daily. 10/14/16  Yes Juline Patch, MD  latanoprost (XALATAN) 0.005 % ophthalmic solution Place 1 drop into both eyes at bedtime.  01/20/16  Yes [provider]  letrozole (FEMARA) 2.5 MG tablet Take 1 tablet (2.5  mg total) by mouth daily. 03/22/17  Yes Karen Kitchens, NP  levothyroxine (SYNTHROID, LEVOTHROID) 75 MCG tablet TAKE (1) TABLET BY MOUTH EVERY DAY 10/14/16  Yes Juline Patch, MD  lidocaine-prilocaine (EMLA) cream Apply 1 application topically as needed. 09/07/16  Yes Corcoran, Drue Second, MD  memantine (NAMENDA) 5 MG tablet Take 5 mg by mouth 2 (two) times daily. Dr Melrose Nakayama 08/31/15 03/28/17 Yes [provider]  potassium chloride (K-DUR) 10 MEQ tablet Take 1 tablet (10 mEq total) by mouth daily. 10/14/16  Yes Juline Patch, MD  vitamin B-12 (CYANOCOBALAMIN) 1000 MCG tablet Take 1,000 mcg by mouth daily.   Yes [provider]    REVIEW OF SYSTEMS:  Review of Systems  Unable to perform ROS: Acuity of condition     VITAL SIGNS:   Vitals:   03/28/17 2130 03/28/17 2200 03/28/17 2230 03/28/17 2316  BP:  (!) 166/86 (!) 162/72 (!) 148/86  Pulse:  73 77 73 76  Resp: (!) 22 17 17 19   Temp:    98.4 F (36.9 C)  TempSrc:    Oral  SpO2: 99% 100% 100% 99%   Wt Readings from Last 3 Encounters:  03/22/17 61.2 kg (134 lb 14.7 oz)  10/14/16 60.8 kg (134 lb)  09/07/16 58.6 kg (129 lb 3 oz)    PHYSICAL EXAMINATION:  Physical Exam  Vitals reviewed. Constitutional: She appears well-developed and well-nourished. No distress.  HENT:  Head: Normocephalic and atraumatic.  Mouth/Throat: Oropharynx is clear and moist.  Eyes: Conjunctivae and EOM are normal. Pupils are equal, round, and reactive to light. No scleral icterus.  Neck: Normal range of motion. Neck supple. No JVD present. No thyromegaly present.  Cardiovascular: Normal rate, regular rhythm and intact distal pulses. Exam reveals no gallop and no friction rub.  No murmur heard. Respiratory: Effort normal and breath sounds normal. No respiratory distress. She has no wheezes. She has no rales.  GI: Soft. Bowel sounds are normal. She exhibits no distension. There is no tenderness.  Musculoskeletal: Normal range of motion. She exhibits no edema.  No arthritis, no gout  Lymphadenopathy:    She has no cervical adenopathy.  Neurological: No cranial nerve deficit.  Unable to fully assess due to patient condition, patient has been unresponsive  Skin: Skin is warm and dry. No rash noted. No erythema.  Psychiatric:  Unable to assess due to patient condition    LABORATORY PANEL:   CBC Recent Labs  Lab 03/28/17 2200  WBC 8.7  HGB 11.8*  HCT 36.4  PLT 176   ------------------------------------------------------------------------------------------------------------------  Chemistries  Recent Labs  Lab 03/28/17 2200  NA 140  K 3.3*  CL 109  CO2 23  GLUCOSE 148*  BUN 23*  CREATININE 0.82  CALCIUM 8.8*  AST 26  ALT 13*  ALKPHOS 49  BILITOT 0.7   ------------------------------------------------------------------------------------------------------------------  Cardiac  Enzymes No results for input(s): TROPONINI in the last 168 hours. ------------------------------------------------------------------------------------------------------------------  RADIOLOGY:  Ct Head Wo Contrast  Result Date: 03/28/2017 CLINICAL DATA:  Patient found down today. History of breast carcinoma. EXAM: CT HEAD WITHOUT CONTRAST CT CERVICAL SPINE WITHOUT CONTRAST TECHNIQUE: Multidetector CT imaging of the head and cervical spine was performed following the standard protocol without intravenous contrast. Multiplanar CT image reconstructions of the cervical spine were also generated. COMPARISON:  Head CT scan 11/27/2015. Brain MRI 09/28/2015. PET CT scan 05/24/2012. FINDINGS: CT HEAD FINDINGS Brain: A large hematoma is identified in the left frontal lobe measuring approximately 3.1 cm craniocaudal by  3.3 cm AP by 1.8 cm transverse for a volume of 9 cc. A small tiny amount of hemorrhage is seen in the anterior aspect of the third ventricle or foraminal of [place]. There is a small amount of subarachnoid hemorrhage over the left frontal convexities. The patient's hematoma results in approximately 0.7 cm left right midline shift. There is atrophy and chronic microvascular ischemic change. Remote left PCA infarct is noted. Vascular: Extensive atherosclerosis is seen. Skull: Intact without focal lesion. Sinuses/Orbits: Negative. Other: None. CT CERVICAL SPINE FINDINGS Alignment: Maintained. Skull base and vertebrae: No acute fracture. No primary bone lesion or focal pathologic process. Soft tissues and spinal canal: No prevertebral fluid or swelling. No visible canal hematoma. Disc levels: Loss of disc space height and endplate spurring are worst at C6-7. Upper chest: Emphysematous change is seen in the apices. There is partial visualization of biapical opacities. Other: None. IMPRESSION: Large intraparenchymal hematoma in the left frontal lobe. Underlying mass lesion is possible. 0.7 cm left right midline  shift is present. Atrophy and extensive chronic microvascular ischemic change. Remote left MCA infarct noted. Partial visualization of biapical opacities. Correlation with PA and lateral chest is recommended. Emphysema. Critical Value/emergent results were called by telephone at the time of interpretation on 03/28/2017 at 9:19 pm to Dr. Carrie Mew , who verbally acknowledged these results. Electronically Signed   By: Inge Rise M.D.   On: 03/28/2017 21:22   Ct Cervical Spine Wo Contrast  Result Date: 03/28/2017 CLINICAL DATA:  Patient found down today. History of breast carcinoma. EXAM: CT HEAD WITHOUT CONTRAST CT CERVICAL SPINE WITHOUT CONTRAST TECHNIQUE: Multidetector CT imaging of the head and cervical spine was performed following the standard protocol without intravenous contrast. Multiplanar CT image reconstructions of the cervical spine were also generated. COMPARISON:  Head CT scan 11/27/2015. Brain MRI 09/28/2015. PET CT scan 05/24/2012. FINDINGS: CT HEAD FINDINGS Brain: A large hematoma is identified in the left frontal lobe measuring approximately 3.1 cm craniocaudal by 3.3 cm AP by 1.8 cm transverse for a volume of 9 cc. A small tiny amount of hemorrhage is seen in the anterior aspect of the third ventricle or foraminal of Monro. There is a small amount of subarachnoid hemorrhage over the left frontal convexities. The patient's hematoma results in approximately 0.7 cm left right midline shift. There is atrophy and chronic microvascular ischemic change. Remote left PCA infarct is noted. Vascular: Extensive atherosclerosis is seen. Skull: Intact without focal lesion. Sinuses/Orbits: Negative. Other: None. CT CERVICAL SPINE FINDINGS Alignment: Maintained. Skull base and vertebrae: No acute fracture. No primary bone lesion or focal pathologic process. Soft tissues and spinal canal: No prevertebral fluid or swelling. No visible canal hematoma. Disc levels: Loss of disc space height and  endplate spurring are worst at C6-7. Upper chest: Emphysematous change is seen in the apices. There is partial visualization of biapical opacities. Other: None. IMPRESSION: Large intraparenchymal hematoma in the left frontal lobe. Underlying mass lesion is possible. 0.7 cm left right midline shift is present. Atrophy and extensive chronic microvascular ischemic change. Remote left MCA infarct noted. Partial visualization of biapical opacities. Correlation with PA and lateral chest is recommended. Emphysema. Critical Value/emergent results were called by telephone at the time of interpretation on 03/28/2017 at 9:19 pm to Dr. Carrie Mew , who verbally acknowledged these results. Electronically Signed   By: Inge Rise M.D.   On: 03/28/2017 21:22    EKG:   Orders placed or performed in visit on 03/28/17  . EKG 12-Lead  .  EKG 12-Lead    IMPRESSION AND PLAN:  Principal Problem:   ICH (intracerebral hemorrhage) (HCC) -IV nicardipine to keep blood pressure less than 277 systolic, neurosurgery recommended giving the patient platelets that she was on aspirin, platelet transfusion ordered.  Admission to ICU and neurology consult Active Problems:   Essential hypertension -IV nicardipine and a line for blood pressure monitoring as above   Cerebral vascular disease -patient had a history of prior stroke, though this was not a hemorrhagic stroke before.  She does have a history of aneurysm in her family, her son stated that her mother died of a ruptured aneurysm.  We will get imaging to try and elucidate cause of this patient's hemorrhage   Moderate dementia without behavioral disturbance -patient was very functional at baseline, living by herself and performing most of her activities of daily living.  Family states that she only cooks breakfast for herself, but otherwise did most other things on her own.   Hypothyroidism -continue home meds once the patient is more alert and taking p.o.  All the  records are reviewed and case discussed with ED provider. Management plans discussed with the patient and/or family.  DVT PROPHYLAXIS: Mechanical only  GI PROPHYLAXIS: None  ADMISSION STATUS: Inpatient  CODE STATUS: DNR Code Status History    Date Active Date Inactive Code Status Order ID Comments User Context   09/27/2015 20:15 09/29/2015 20:01 Full Code [id number]  Idelle Crouch, MD Inpatient      TOTAL CRITICAL CARE TIME TAKING CARE OF THIS PATIENT: 50 minutes.   Alexandra Glass 03/29/2017, 12:47 AM  [facility]  Office  [phone]  CC: Primary care physician; Juline Patch, MD  Note:  This document was prepared using Dragon voice recognition software and may include unintentional dictation errors.

## 2017-03-29 NOTE — Progress Notes (Signed)
[place] made initial visit with Pt. Pt is in and out of consciousness. Son is bedside. Pt suffered a brain hemorrhage. Son stated they are waiting on test results and appreciated the visit. CH offered prayer. CH will inform the On-[facility] of the situation.     03/29/17 1600  Clinical Encounter Type  Visited With Patient;Patient and family together  Visit Type Initial;Spiritual support  Consult/Referral To Chaplain  Spiritual Encounters  Spiritual Needs Prayer;Emotional

## 2017-03-29 NOTE — Evaluation (Signed)
Clinical/Bedside Swallow Evaluation Patient Details  Name: Alexandra Glass MRN: [id number] Date of Birth: 1924/05/31  Today's Date: 03/29/2017 Time: SLP Start Time (ACUTE ONLY): 0900 SLP Stop Time (ACUTE ONLY): 1000 SLP Time Calculation (min) (ACUTE ONLY): 60 min  Past Medical History:  Past Medical History:  Diagnosis Date  . Breast cancer ([place])    right  . Hypertension   . Personal history of chemotherapy   . Personal history of radiation therapy   . Renal cancer ([place])   . Thyroid disease    Past Surgical History:  Past Surgical History:  Procedure Laterality Date  . APPENDECTOMY    . BREAST BIOPSY Left    neg  . BREAST SURGERY Right   . MASTECTOMY Right 02/04/11   chemo/rad- takes femara  . tumor surg     removed from L) side  . VAGINAL HYSTERECTOMY     HPI:  Pt is a 83 year old female with known history of breast cancer, stage -3 lung cancer and thyroid disease. Apparently  Patient lives by herself. Family members tried to reach her by phone and were unable to reach her.  She was found on the floor covered in her urine. According to the family member she was seen in her usual state of health on 12/10.  Upon arrival to the ED she was sent to get a CT of her head which was concerning for large left frontal lobe hematoma.  She is not a surgical candidate as per the Neurosurgeon at [facility] and needs to be managed medically.  Patient was sent to the ICU for closer monitoring. 12/12 CT head and spine>>Large intraparenchymal hematoma in the left frontal lobe. Underlying mass lesion is possible. 0.7 cm left right midline shift is present.Atrophy and extensive chronic microvascular ischemic change. Remote Left MCA per notes. Pt exhibits mumbled/muttered speech; requires cues for follow through w/ instruction. She was able to give her first name. She often closed her eyes during the session but reattended when given verbal cues. Unsure of pt's baseline Cognitive status as Dementia was reported per  a chart note.   Assessment / Plan / Recommendation Clinical Impression  Pt appears to present w/ min+ oropharyngeal phase swallowing deficits w/ increased risk for dysphagia and aspiration c/b decreased awareness of the bolus and task of swallowing in general - suspect this is related to current Neurological issues ongoing at this time, baseline Cognitive issues(?). Pt's oral motor skills appeared appropriate for bolus management and swallowing; speech somewhat muttered/mumbled w/ minimal output. Pt accepted po trials via spoon and cup (she bit frequently on the straw d/t confusion about its use) and exhibited adequate bolus management then transfer and appropriate oral clearing. Noted min delay in A-P transfer, oral holding but given time, she cleared adequately. Pt was only given trials of Nectar consistency liquids and purees for the increased texture for more stimulation and awareness of the trials. Pt was also given few ice chips which she appeared to tolerate w/out incident. No decline in respiratory status or O2 sats during/post trials; no overt coughing or throat clearing. Due to pt's current Neurological status and concern for aspiration, recommend a dysphagia level 1 diet w/ Nectar liquids; ice chips for pleasure w/ NSG supervision post oral care; aspiration precautions; Pills in puree - Crushed for easier swallowing. ST services will f/u w/ toleration of diet and trials to upgrade when appropriate. NSG/MD updated.  SLP Visit Diagnosis: Dysphagia, oropharyngeal phase (R13.12)    Aspiration Risk  Mild aspiration  risk    Diet Recommendation  Dysphagia level 1 w/ Nectar consistency liquids. Aspiration precautions; feeding support at meals.   Medication Administration: Crushed with puree(as able)    Other  Recommendations Recommended Consults: (Dietician f/u) Oral Care Recommendations: Oral care BID;Staff/trained caregiver to provide oral care Other Recommendations: Order thickener from  pharmacy;Prohibited food (jello, ice cream, thin soups);Remove water pitcher;Have oral suction available   Follow up Recommendations Skilled Nursing facility      Frequency and Duration min 3x week  2 weeks       Prognosis Prognosis for Safe Diet Advancement: Fair(-Good) Barriers to Reach Goals: Cognitive deficits      Swallow Study   General Date of Onset: 03/28/17 HPI: Pt is a 83 year old female with known history of breast cancer, stage -3 lung cancer and thyroid disease. Apparently  Patient lives by herself. Family members tried to reach her by phone and were unable to reach her.  She was found on the floor covered in her urine. According to the family member she was seen in her usual state of health on 12/10.  Upon arrival to the ED she was sent to get a CT of her head which was concerning for large left frontal lobe hematoma.  She is not a surgical candidate as per the Neurosurgeon at [facility] and needs to be managed medically.  Patient was sent to the ICU for closer monitoring. 12/12 CT head and spine>>Large intraparenchymal hematoma in the left frontal lobe. Underlying mass lesion is possible. 0.7 cm left right midline shift is present.Atrophy and extensive chronic microvascular ischemic change. Remote Left MCA per notes. Pt exhibits mumbled/muttered speech; requires cues for follow through w/ instruction. She was able to give her first name. She often closed her eyes during the session but reattended when given verbal cues.  Type of Study: Bedside Swallow Evaluation Previous Swallow Assessment: none reported in chart Diet Prior to this Study: (unknown but lived at home alone) Temperature Spikes Noted: No(elevated temp at admission, normal now; wbc 8.7) Respiratory Status: Room air History of Recent Intubation: No Behavior/Cognition: Alert;Cooperative;Pleasant mood;Confused;Distractible;Requires cueing Oral Cavity Assessment: Dry(sticky) Oral Care Completed by SLP: Yes Oral Cavity -  Dentition: Missing dentition(many native dentition) Vision: Functional for self-feeding Self-Feeding Abilities: Able to feed self;Needs assist;Needs set up Patient Positioning: Upright in bed Baseline Vocal Quality: Low vocal intensity Volitional Cough: Cognitively unable to elicit Volitional Swallow: Unable to elicit    Oral/Motor/Sensory Function Overall Oral Motor/Sensory Function: Within functional limits(w/bolus management and speech; at rest)   Ice Chips Ice chips: Within functional limits Presentation: Spoon(fed; 5 trials)   Thin Liquid Thin Liquid: Not tested Other Comments: d/t cognitive status and Neurological status currently    Nectar Thick Nectar Thick Liquid: Impaired Presentation: Cup;Spoon;Self Fed;Straw(assisted; bit on the straw d/t confusion; ~3 ozs) Oral Phase Impairments: Poor awareness of bolus(biting on straw) Oral phase functional implications: Prolonged oral transit;Oral holding(min) Pharyngeal Phase Impairments: Suspected delayed Swallow(?) Other Comments: no overt s/s of aspiration noted   Honey Thick Honey Thick Liquid: Not tested   Puree Puree: Impaired Presentation: Spoon(fed; 8 trials) Oral Phase Impairments: Poor awareness of bolus(min) Oral Phase Functional Implications: Prolonged oral transit;Oral holding(min) Pharyngeal Phase Impairments: (none)   Solid   GO   Solid: Not tested    Functional Assessment Tool Used: clinical judgement Functional Limitations: Swallowing Swallow Current Status ([id number]): At least 40 percent but less than 60 percent impaired, limited or restricted Swallow Goal Status [phone]): At least 20 percent but  less than 40 percent impaired, limited or restricted Swallow Discharge Status [phone]): At least 1 percent but less than 20 percent impaired, limited or restricted     Orinda Kenner, MS, CCC-SLP [name],[name] 03/29/2017,1:26 PM

## 2017-03-29 NOTE — Consult Note (Signed)
Referring Physician: Vianne Bulls    Chief Complaint: ICH  HPI: Alexandra Glass is an 83 y.o. female with a history of stroke on ASA and breast cancer who due to aphasia is unable to provide ant history today.  Family not available therefore all history obtained from the chart.  Family last spoke to the patient on Monday night .  Family member tried to call her the yesterday morning and she did not answer, so a different family member went by the house to see her and found her down and unresponsive, covered in urine.    Date last known well: Date: 02/25/2017 Time last known well: Time: 19:30 tPA Given: No: ICH   ICH Score: 3       Past Medical History:  Diagnosis Date  . Breast cancer ([place])    right  . Hypertension   . Personal history of chemotherapy   . Personal history of radiation therapy   . Renal cancer ([place])   . Thyroid disease     Past Surgical History:  Procedure Laterality Date  . APPENDECTOMY    . BREAST BIOPSY Left    neg  . BREAST SURGERY Right   . MASTECTOMY Right 02/04/11   chemo/rad- takes femara  . tumor surg     removed from L) side  . VAGINAL HYSTERECTOMY      Family History  Problem Relation Age of Onset  . Cancer Sister        lung  . Cancer Sister        pancreas  . Cancer Sister        breast  . Breast cancer Sister    Social History:  reports that she quit smoking about 43 years ago. Her smoking use included cigarettes. she has never used smokeless tobacco. She reports that she does not drink alcohol or use drugs.  Allergies:  Allergies  Allergen Reactions  . No Known Allergies     Medications:  I have reviewed the patient's current medications. Prior to Admission:  Medications Prior to Admission  Medication Sig Dispense Refill Last Dose  . aspirin 325 MG tablet Take 1 tablet (325 mg total) by mouth daily. 90 tablet 0 03/27/2017 at Unknown time  . atorvastatin (LIPITOR) 20 MG tablet Take 1 tablet (20 mg total) by mouth daily. 30 tablet 6  03/27/2017 at Unknown time  . donepezil (ARICEPT) 10 MG tablet Take 10 mg by mouth at bedtime. [place]   03/27/2017 at Unknown time  . hydrochlorothiazide (HYDRODIURIL) 12.5 MG tablet Take 1 tablet (12.5 mg total) by mouth daily. 30 tablet 6 03/27/2017 at Unknown time  . latanoprost (XALATAN) 0.005 % ophthalmic solution Place 1 drop into both eyes at bedtime.    03/27/2017 at Unknown time  . letrozole (FEMARA) 2.5 MG tablet Take 1 tablet (2.5 mg total) by mouth daily. 90 tablet 1 03/27/2017 at Unknown time  . levothyroxine (SYNTHROID, LEVOTHROID) 75 MCG tablet TAKE (1) TABLET BY MOUTH EVERY DAY 30 tablet 5 03/27/2017 at Unknown time  . lidocaine-prilocaine (EMLA) cream Apply 1 application topically as needed. 30 g 0 prn at prn  . memantine (NAMENDA) 5 MG tablet Take 5 mg by mouth 2 (two) times daily. Dr Melrose Nakayama   03/27/2017 at Unknown time  . potassium chloride (K-DUR) 10 MEQ tablet Take 1 tablet (10 mEq total) by mouth daily. 30 tablet 6 03/27/2017 at Unknown time  . vitamin B-12 (CYANOCOBALAMIN) 1000 MCG tablet Take 1,000 mcg by mouth daily.   03/27/2017 at  Unknown time   Scheduled: . pantoprazole (PROTONIX) IV  40 mg Intravenous QHS    ROS: Unable to provide due to mental status  Physical Examination: Blood pressure 135/77, pulse 78, temperature 98.7 F (37.1 C), temperature source Rectal, resp. rate (!) 22, height 5\' 4"  (1.626 m), weight 63.6 kg (140 lb 3.4 oz), SpO2 98 %.  HEENT-  Normocephalic, no lesions, without obvious abnormality.  Normal external eye and conjunctiva.  Normal TM's bilaterally.  Normal auditory canals and external ears. Normal external nose, mucus membranes and septum.  Normal pharynx. Cardiovascular- S1, S2 normal, pulses palpable throughout   Lungs- chest clear, no wheezing, rales, normal symmetric air entry Abdomen- soft, non-tender; bowel sounds normal; no masses,  no organomegaly Extremities- RUE swelling Lymph-no adenopathy palpable Musculoskeletal-no joint  tenderness, deformity or swelling Skin-multiple areas of hypopigmentation  Neurological Examination   Mental Status: Alert.  Responds "yes" to al questions.  Does not follow commands.   Cranial Nerves: II: Discs flat bilaterally; Blinks to bilateral confrontation, pupils equal, round, reactive to light and accommodation III,IV, VI: ptosis not present, extra-ocular motions intact bilaterally V,VII: face symmetric, intact corneals bilaterally VIII: hearing normal bilaterally IX,X: gag reflex present XI: bilateral shoulder shrug XII: unable to test  Motor: Does not maintain any extremity when actively lifted but this appears secondary to ability to follow commands and not to strength.  Tone normal.   Sensory: Grimaces to noxious stimuli throughout Deep Tendon Reflexes: 2+ and symmetric with absent AJ's bilaterally Plantars: Right: downgoing   Left: downgoing Cerebellar: Unable to perform Gait: not tested due to safety concerns    Laboratory Studies:  Basic Metabolic Panel: Recent Labs  Lab 03/28/17 2200  NA 140  K 3.3*  CL 109  CO2 23  GLUCOSE 148*  BUN 23*  CREATININE 0.82  CALCIUM 8.8*    Liver Function Tests: Recent Labs  Lab 03/28/17 2200  AST 26  ALT 13*  ALKPHOS 49  BILITOT 0.7  PROT 6.8  ALBUMIN 3.7   No results for input(s): LIPASE, AMYLASE in the last 168 hours. No results for input(s): AMMONIA in the last 168 hours.  CBC: Recent Labs  Lab 03/28/17 2200  WBC 8.7  HGB 11.8*  HCT 36.4  MCV 78.4*  PLT 176    Cardiac Enzymes: Recent Labs  Lab 03/28/17 2200  CKTOTAL 882*    BNP: Invalid input(s): POCBNP  CBG: Recent Labs  Lab 03/29/17 0253  GLUCAP 7    Microbiology: Results for orders placed or performed during the hospital encounter of 03/28/17  MRSA PCR Screening     Status: None   Collection Time: 03/29/17  2:42 AM  Result Value Ref Range Status   MRSA by PCR NEGATIVE NEGATIVE Final    Comment:        The GeneXpert MRSA  Assay (FDA approved for NASAL specimens only), is one component of a comprehensive MRSA colonization surveillance program. It is not intended to diagnose MRSA infection nor to guide or monitor treatment for MRSA infections.     Coagulation Studies: No results for input(s): LABPROT, INR in the last 72 hours.  Urinalysis:  Recent Labs  Lab 03/28/17 2018  COLORURINE YELLOW*  LABSPEC 1.024  PHURINE 5.0  GLUCOSEU NEGATIVE  HGBUR NEGATIVE  BILIRUBINUR NEGATIVE  KETONESUR 5*  PROTEINUR 30*  NITRITE NEGATIVE  LEUKOCYTESUR NEGATIVE    Lipid Panel:    Component Value Date/Time   CHOL 158 10/14/2016 0952   TRIG 55 10/14/2016 0952  HDL 73 10/14/2016 0952   CHOLHDL 2.2 10/14/2016 0952   CHOLHDL 4.0 09/29/2015 1400   VLDL 24 09/29/2015 1400   LDLCALC 74 10/14/2016 0952    HgbA1C:  Lab Results  Component Value Date   HGBA1C 5.8 09/29/2015    Urine Drug Screen:  No results found for: LABOPIA, COCAINSCRNUR, LABBENZ, AMPHETMU, THCU, LABBARB  Alcohol Level: No results for input(s): ETH in the last 168 hours.   Imaging: Ct Head Wo Contrast  Result Date: 03/28/2017 CLINICAL DATA:  Patient found down today. History of breast carcinoma. EXAM: CT HEAD WITHOUT CONTRAST CT CERVICAL SPINE WITHOUT CONTRAST TECHNIQUE: Multidetector CT imaging of the head and cervical spine was performed following the standard protocol without intravenous contrast. Multiplanar CT image reconstructions of the cervical spine were also generated. COMPARISON:  Head CT scan 11/27/2015. Brain MRI 09/28/2015. PET CT scan 05/24/2012. FINDINGS: CT HEAD FINDINGS Brain: A large hematoma is identified in the left frontal lobe measuring approximately 3.1 cm craniocaudal by 3.3 cm AP by 1.8 cm transverse for a volume of 9 cc. A small tiny amount of hemorrhage is seen in the anterior aspect of the third ventricle or foraminal of Monro. There is a small amount of subarachnoid hemorrhage over the left frontal convexities.  The patient's hematoma results in approximately 0.7 cm left right midline shift. There is atrophy and chronic microvascular ischemic change. Remote left PCA infarct is noted. Vascular: Extensive atherosclerosis is seen. Skull: Intact without focal lesion. Sinuses/Orbits: Negative. Other: None. CT CERVICAL SPINE FINDINGS Alignment: Maintained. Skull base and vertebrae: No acute fracture. No primary bone lesion or focal pathologic process. Soft tissues and spinal canal: No prevertebral fluid or swelling. No visible canal hematoma. Disc levels: Loss of disc space height and endplate spurring are worst at C6-7. Upper chest: Emphysematous change is seen in the apices. There is partial visualization of biapical opacities. Other: None. IMPRESSION: Large intraparenchymal hematoma in the left frontal lobe. Underlying mass lesion is possible. 0.7 cm left right midline shift is present. Atrophy and extensive chronic microvascular ischemic change. Remote left MCA infarct noted. Partial visualization of biapical opacities. Correlation with PA and lateral chest is recommended. Emphysema. Critical Value/emergent results were called by telephone at the time of interpretation on 03/28/2017 at 9:19 pm to Dr. Carrie Mew , who verbally acknowledged these results. Electronically Signed   By: Inge Rise M.D.   On: 03/28/2017 21:22   Ct Cervical Spine Wo Contrast  Result Date: 03/28/2017 CLINICAL DATA:  Patient found down today. History of breast carcinoma. EXAM: CT HEAD WITHOUT CONTRAST CT CERVICAL SPINE WITHOUT CONTRAST TECHNIQUE: Multidetector CT imaging of the head and cervical spine was performed following the standard protocol without intravenous contrast. Multiplanar CT image reconstructions of the cervical spine were also generated. COMPARISON:  Head CT scan 11/27/2015. Brain MRI 09/28/2015. PET CT scan 05/24/2012. FINDINGS: CT HEAD FINDINGS Brain: A large hematoma is identified in the left frontal lobe measuring  approximately 3.1 cm craniocaudal by 3.3 cm AP by 1.8 cm transverse for a volume of 9 cc. A small tiny amount of hemorrhage is seen in the anterior aspect of the third ventricle or foraminal of Monro. There is a small amount of subarachnoid hemorrhage over the left frontal convexities. The patient's hematoma results in approximately 0.7 cm left right midline shift. There is atrophy and chronic microvascular ischemic change. Remote left PCA infarct is noted. Vascular: Extensive atherosclerosis is seen. Skull: Intact without focal lesion. Sinuses/Orbits: Negative. Other: None. CT CERVICAL SPINE FINDINGS  Alignment: Maintained. Skull base and vertebrae: No acute fracture. No primary bone lesion or focal pathologic process. Soft tissues and spinal canal: No prevertebral fluid or swelling. No visible canal hematoma. Disc levels: Loss of disc space height and endplate spurring are worst at C6-7. Upper chest: Emphysematous change is seen in the apices. There is partial visualization of biapical opacities. Other: None. IMPRESSION: Large intraparenchymal hematoma in the left frontal lobe. Underlying mass lesion is possible. 0.7 cm left right midline shift is present. Atrophy and extensive chronic microvascular ischemic change. Remote left MCA infarct noted. Partial visualization of biapical opacities. Correlation with PA and lateral chest is recommended. Emphysema. Critical Value/emergent results were called by telephone at the time of interpretation on 03/28/2017 at 9:19 pm to Dr. Carrie Mew , who verbally acknowledged these results. Electronically Signed   By: Inge Rise M.D.   On: 03/28/2017 21:22    Assessment: 83 y.o. female presenting with AMS.  On neurological examination patient with expressive and receptive aphasia.  Head CT reviewed and shows a left frontal ICH.  Neurosurgery and has reviewed and feels no surgical intervention warranted.  Patient on ASA prior to admission.  Further work up  recommended.    Stroke Risk Factors - hypertension  Plan: 1. HgbA1c, fasting lipid panel 2. MRI, MRA  of the brain without contrast 3. PT consult, OT consult, Speech consult 4. Echocardiogram 5. Carotid dopplers 6. Prophylactic therapy-None. D/C ASA. 7. NPO until RN stroke swallow screen 8. Telemetry monitoring 9. Frequent neuro checks 10. BP control with SBP<160   Alexis Goodell, MD Neurology [phone] 03/29/2017, 11:07 AM

## 2017-03-29 NOTE — Progress Notes (Signed)
At this morning for aphasia, found to have left frontal intracranial hemorrhage by CT head.  Seen by neurology, patient is in ICU because of hypertension, started on nicardipine drip  Physical examination; patient is not following commands, pupils equally reacting to light. Cardiovascular S1-S2 regular.  Lungs clear to auscultation, no wheeze. Abdomen soft, nontender, nondistended bowel sounds present. Neurologically: Patient does not follow commands, pupils are equal reacting to light No facial asymmetry, exam showed normal tone, sensations patient is grimacing to noxious stimuli throughout, DTR 2+ bilaterally, plantars downgoing both sides,  Assessment and plan 58.  83 year old female with altered mental status secondary to left frontal intracranial hemorrhage, not intervenable as per neurosurgery so they recommend admission to medical unit.  Continue Keppra IV.  Patient admitted to ICU for blood pressure control, she is on nicardipine drip.  Neurology is following, get MRI and MRA of the brain without contrast, echocardiogram, carotid ultrasound, discontinue aspirin, continue n.p.o., dysphagia 1 with nectar thick liquids.  #2 dementia: DNR. Prognosis is really poor.

## 2017-03-30 ENCOUNTER — Inpatient Hospital Stay: Payer: Medicare Other

## 2017-03-30 ENCOUNTER — Inpatient Hospital Stay
Admit: 2017-03-30 | Discharge: 2017-03-30 | Disposition: A | Discharge status: Home / Self Care | Payer: Medicare Other | Attending: Pulmonary Disease | Admitting: Pulmonary Disease

## 2017-03-30 LAB — BASIC METABOLIC PANEL
ANION GAP: 9 (ref 5–15)
BUN: 18 mg/dL (ref 6–20)
CO2: 20 mmol/L — ABNORMAL LOW (ref 22–32)
Calcium: 8.7 mg/dL — ABNORMAL LOW (ref 8.9–10.3)
Chloride: 108 mmol/L (ref 101–111)
Creatinine, Ser: 0.6 mg/dL (ref 0.44–1.00)
Glucose, Bld: 128 mg/dL — ABNORMAL HIGH (ref 65–99)
POTASSIUM: 3 mmol/L — AB (ref 3.5–5.1)
SODIUM: 137 mmol/L (ref 135–145)

## 2017-03-30 LAB — PREPARE PLATELET PHERESIS: UNIT DIVISION: 0

## 2017-03-30 LAB — BPAM PLATELET PHERESIS
Blood Product Expiration Date: 201812132359
ISSUE DATE / TIME: 201812120135
Unit Type and Rh: 9500

## 2017-03-30 LAB — MAGNESIUM: MAGNESIUM: 2.1 mg/dL (ref 1.7–2.4)

## 2017-03-30 LAB — GLUCOSE, CAPILLARY
GLUCOSE-CAPILLARY: 143 mg/dL — AB (ref 65–99)
GLUCOSE-CAPILLARY: 113 mg/dL — AB (ref 65–99)
Glucose-Capillary: 133 mg/dL — ABNORMAL HIGH (ref 65–99)
Glucose-Capillary: 120 mg/dL — ABNORMAL HIGH (ref 65–99)

## 2017-03-30 LAB — URINE CULTURE: Culture: NO GROWTH

## 2017-03-30 MED ORDER — MORPHINE SULFATE (PF) 2 MG/ML IV SOLN
2.0000 mg | INTRAVENOUS | Status: DC | PRN
  Administered 2017-04-02 (×3): 2 mg via INTRAVENOUS
  Filled 2017-03-30 (×3): qty 1

## 2017-03-30 MED ORDER — DEXMEDETOMIDINE HCL IN NACL 400 MCG/100ML IV SOLN
0.4000 ug/kg/h | INTRAVENOUS | Status: DC
  Administered 2017-03-30: 0.6 ug/kg/h via INTRAVENOUS
  Administered 2017-03-30 – 2017-04-01 (×2): 0.4 ug/kg/h via INTRAVENOUS
  Filled 2017-03-30 (×3): qty 100

## 2017-03-30 MED ORDER — MEMANTINE HCL 5 MG PO TABS
5.0000 mg | ORAL_TABLET | Freq: Two times a day (BID) | ORAL | Status: DC
  Administered 2017-03-31 – 2017-04-01 (×4): 5 mg via ORAL
  Filled 2017-03-30 (×7): qty 1

## 2017-03-30 MED ORDER — LETROZOLE 2.5 MG PO TABS
2.5000 mg | ORAL_TABLET | Freq: Every day | ORAL | Status: DC
  Administered 2017-03-31 – 2017-04-05 (×5): 2.5 mg via ORAL
  Filled 2017-03-30 (×8): qty 1

## 2017-03-30 MED ORDER — LATANOPROST 0.005 % OP SOLN
1.0000 [drp] | Freq: Every day | OPHTHALMIC | Status: DC
  Administered 2017-03-31 – 2017-04-04 (×5): 1 [drp] via OPHTHALMIC
  Filled 2017-03-30: qty 2.5

## 2017-03-30 MED ORDER — POTASSIUM CHLORIDE 20 MEQ PO PACK
40.0000 meq | PACK | Freq: Once | ORAL | Status: DC
  Filled 2017-03-30: qty 2

## 2017-03-30 MED ORDER — HYDRALAZINE HCL 20 MG/ML IJ SOLN
10.0000 mg | INTRAMUSCULAR | Status: DC | PRN
  Administered 2017-03-30 – 2017-04-01 (×6): 20 mg via INTRAVENOUS
  Administered 2017-04-01: 10 mg via INTRAVENOUS
  Administered 2017-04-02: 20 mg via INTRAVENOUS
  Administered 2017-04-02: 10 mg via INTRAVENOUS
  Administered 2017-04-03 – 2017-04-04 (×2): 20 mg via INTRAVENOUS
  Filled 2017-03-30 (×11): qty 1

## 2017-03-30 MED ORDER — LEVOTHYROXINE SODIUM 50 MCG PO TABS
75.0000 ug | ORAL_TABLET | Freq: Every day | ORAL | Status: DC
  Administered 2017-03-31 – 2017-04-04 (×5): 75 ug via ORAL
  Filled 2017-03-30 (×6): qty 2

## 2017-03-30 MED ORDER — DONEPEZIL HCL 5 MG PO TABS
10.0000 mg | ORAL_TABLET | Freq: Every day | ORAL | Status: DC
  Administered 2017-03-31 – 2017-04-01 (×2): 10 mg via ORAL
  Filled 2017-03-30: qty 2
  Filled 2017-03-30: qty 1
  Filled 2017-03-30: qty 2

## 2017-03-30 MED ORDER — METOPROLOL TARTRATE 25 MG PO TABS
25.0000 mg | ORAL_TABLET | Freq: Two times a day (BID) | ORAL | Status: DC
  Administered 2017-03-30 – 2017-04-01 (×5): 25 mg via ORAL
  Filled 2017-03-30 (×5): qty 1

## 2017-03-30 MED ORDER — POTASSIUM CHLORIDE 10 MEQ/100ML IV SOLN
10.0000 meq | INTRAVENOUS | Status: AC
  Administered 2017-03-30 (×3): 10 meq via INTRAVENOUS
  Filled 2017-03-30 (×3): qty 100

## 2017-03-30 MED ORDER — ATORVASTATIN CALCIUM 20 MG PO TABS
20.0000 mg | ORAL_TABLET | Freq: Every day | ORAL | Status: DC
  Administered 2017-04-01 – 2017-04-04 (×3): 20 mg via ORAL
  Filled 2017-03-30 (×3): qty 1

## 2017-03-30 NOTE — Progress Notes (Signed)
Subjective: Patient some improved today.  Some intelligible words produced and following some commands.    Objective: Current vital signs: BP 101/82   Pulse 76   Temp 98.2 F (36.8 C) (Oral)   Resp 14   Ht 5\' 4"  (1.626 m)   Wt 63.6 kg (140 lb 3.4 oz)   SpO2 98%   BMI 24.07 kg/m  Vital signs in last 24 hours: Temp:  [98.2 F (36.8 C)-100.3 F (37.9 C)] 98.2 F (36.8 C) (12/13 0800) Pulse Rate:  [72-100] 76 (12/13 0930) Resp:  [14-28] 14 (12/13 0930) BP: (101-177)/(52-100) 101/82 (12/13 0930) SpO2:  [92 %-100 %] 98 % (12/13 0930)  Intake/Output from previous day: 12/12 0701 - 12/13 0700 In: 770 [I.V.:270; IV Piggyback:500] Out: 1700 [Urine:1700] Intake/Output this shift: Total I/O In: 100 [IV Piggyback:100] Out: -  Nutritional status: DIET - DYS 1 Room service appropriate? Yes with Assist; Fluid consistency: Nectar Thick  Neurologic Exam: Mental Status: Resting but able to be alerted by calling her name.  Follows commands with nonverbal cues.  Minimal speech.  Nonfluent.     Cranial Nerves: II: Discs flat bilaterally; Blinks to bilateral confrontation, pupils equal, round, reactive to light and accommodation III,IV, VI: ptosis not present, extra-ocular motions intact bilaterally V,VII: face symmetric, intact corneals bilaterally VIII: hearing normal bilaterally IX,X: gag reflex present XI: bilateral shoulder shrug XII: unable to test  Motor: Maintains both upper extremities when lifted actively with no focal weakness noted.  Patient will not maintain lower extremities  Sensory: Grimaces to noxious stimuli throughout    Lab Results: Basic Metabolic Panel: Recent Labs  Lab 03/28/17 2200 03/29/17 1337 03/29/17 2115 03/30/17 0539  NA 140 140  --  137  K 3.3* 3.1* 3.7 3.0*  CL 109 109  --  108  CO2 23 23  --  20*  GLUCOSE 148* 128*  --  128*  BUN 23* 19  --  18  CREATININE 0.82 0.68  --  0.60  CALCIUM 8.8* 8.4*  --  8.7*  MG  --  2.1  --  2.1    Liver  Function Tests: Recent Labs  Lab 03/28/17 2200  AST 26  ALT 13*  ALKPHOS 49  BILITOT 0.7  PROT 6.8  ALBUMIN 3.7   No results for input(s): LIPASE, AMYLASE in the last 168 hours. No results for input(s): AMMONIA in the last 168 hours.  CBC: Recent Labs  Lab 03/28/17 2200  WBC 8.7  HGB 11.8*  HCT 36.4  MCV 78.4*  PLT 176    Cardiac Enzymes: Recent Labs  Lab 03/28/17 2200  CKTOTAL 882*    Lipid Panel: No results for input(s): CHOL, TRIG, HDL, CHOLHDL, VLDL, LDLCALC in the last 168 hours.  CBG: Recent Labs  Lab 03/29/17 0253  GLUCAP 60    Microbiology: Results for orders placed or performed during the hospital encounter of 03/28/17  Urine culture     Status: None   Collection Time: 03/28/17  8:18 PM  Result Value Ref Range Status   Specimen Description URINE, CATHETERIZED  Final   Special Requests NONE  Final   Culture   Final    NO GROWTH Performed at [facility], [place] [address], [place], [place] [address]    Report Status 03/30/2017 FINAL  Final  MRSA PCR Screening     Status: None   Collection Time: 03/29/17  2:42 AM  Result Value Ref Range Status   MRSA by PCR NEGATIVE NEGATIVE Final  Comment:        The GeneXpert MRSA Assay (FDA approved for NASAL specimens only), is one component of a comprehensive MRSA colonization surveillance program. It is not intended to diagnose MRSA infection nor to guide or monitor treatment for MRSA infections.     Coagulation Studies: No results for input(s): LABPROT, INR in the last 72 hours.  Imaging: Ct Head Wo Contrast  Result Date: 03/28/2017 CLINICAL DATA:  Patient found down today. History of breast carcinoma. EXAM: CT HEAD WITHOUT CONTRAST CT CERVICAL SPINE WITHOUT CONTRAST TECHNIQUE: Multidetector CT imaging of the head and cervical spine was performed following the standard protocol without intravenous contrast. Multiplanar CT image reconstructions of the cervical spine were also generated.  COMPARISON:  Head CT scan 11/27/2015. Brain MRI 09/28/2015. PET CT scan 05/24/2012. FINDINGS: CT HEAD FINDINGS Brain: A large hematoma is identified in the left frontal lobe measuring approximately 3.1 cm craniocaudal by 3.3 cm AP by 1.8 cm transverse for a volume of 9 cc. A small tiny amount of hemorrhage is seen in the anterior aspect of the third ventricle or foraminal of Monro. There is a small amount of subarachnoid hemorrhage over the left frontal convexities. The patient's hematoma results in approximately 0.7 cm left right midline shift. There is atrophy and chronic microvascular ischemic change. Remote left PCA infarct is noted. Vascular: Extensive atherosclerosis is seen. Skull: Intact without focal lesion. Sinuses/Orbits: Negative. Other: None. CT CERVICAL SPINE FINDINGS Alignment: Maintained. Skull base and vertebrae: No acute fracture. No primary bone lesion or focal pathologic process. Soft tissues and spinal canal: No prevertebral fluid or swelling. No visible canal hematoma. Disc levels: Loss of disc space height and endplate spurring are worst at C6-7. Upper chest: Emphysematous change is seen in the apices. There is partial visualization of biapical opacities. Other: None. IMPRESSION: Large intraparenchymal hematoma in the left frontal lobe. Underlying mass lesion is possible. 0.7 cm left right midline shift is present. Atrophy and extensive chronic microvascular ischemic change. Remote left MCA infarct noted. Partial visualization of biapical opacities. Correlation with PA and lateral chest is recommended. Emphysema. Critical Value/emergent results were called by telephone at the time of interpretation on 03/28/2017 at 9:19 pm to Dr. Carrie Mew , who verbally acknowledged these results. Electronically Signed   By: Inge Rise M.D.   On: 03/28/2017 21:22   Ct Cervical Spine Wo Contrast  Result Date: 03/28/2017 CLINICAL DATA:  Patient found down today. History of breast carcinoma.  EXAM: CT HEAD WITHOUT CONTRAST CT CERVICAL SPINE WITHOUT CONTRAST TECHNIQUE: Multidetector CT imaging of the head and cervical spine was performed following the standard protocol without intravenous contrast. Multiplanar CT image reconstructions of the cervical spine were also generated. COMPARISON:  Head CT scan 11/27/2015. Brain MRI 09/28/2015. PET CT scan 05/24/2012. FINDINGS: CT HEAD FINDINGS Brain: A large hematoma is identified in the left frontal lobe measuring approximately 3.1 cm craniocaudal by 3.3 cm AP by 1.8 cm transverse for a volume of 9 cc. A small tiny amount of hemorrhage is seen in the anterior aspect of the third ventricle or foraminal of Monro. There is a small amount of subarachnoid hemorrhage over the left frontal convexities. The patient's hematoma results in approximately 0.7 cm left right midline shift. There is atrophy and chronic microvascular ischemic change. Remote left PCA infarct is noted. Vascular: Extensive atherosclerosis is seen. Skull: Intact without focal lesion. Sinuses/Orbits: Negative. Other: None. CT CERVICAL SPINE FINDINGS Alignment: Maintained. Skull base and vertebrae: No acute fracture. No primary bone lesion  or focal pathologic process. Soft tissues and spinal canal: No prevertebral fluid or swelling. No visible canal hematoma. Disc levels: Loss of disc space height and endplate spurring are worst at C6-7. Upper chest: Emphysematous change is seen in the apices. There is partial visualization of biapical opacities. Other: None. IMPRESSION: Large intraparenchymal hematoma in the left frontal lobe. Underlying mass lesion is possible. 0.7 cm left right midline shift is present. Atrophy and extensive chronic microvascular ischemic change. Remote left MCA infarct noted. Partial visualization of biapical opacities. Correlation with PA and lateral chest is recommended. Emphysema. Critical Value/emergent results were called by telephone at the time of interpretation on  03/28/2017 at 9:19 pm to Dr. Carrie Mew , who verbally acknowledged these results. Electronically Signed   By: Inge Rise M.D.   On: 03/28/2017 21:22   Dg Chest Port 1 View  Result Date: 03/30/2017 CLINICAL DATA:  History of breast and renal carcinoma. Intracerebral hemorrhage. EXAM: PORTABLE CHEST 1 VIEW COMPARISON:  Chest radiograph July 07, 2014 and chest CT February 29, 2018 FINDINGS: Port-A-Cath tip is at the cavoatrial junction. No pneumothorax. There is no appreciable edema or consolidation. Heart is mildly enlarged with pulmonary vascularity within normal limits. Aorta is tortuous with aortic atherosclerosis. No evident adenopathy. Bones appear osteoporotic. No focal bone lesions are evident. IMPRESSION: Port-A-Cath as described without pneumothorax. Cardiomegaly with pulmonary vascularity within normal limits. No edema or consolidation. Tortuous aorta with aortic atherosclerosis noted. Aortic Atherosclerosis (ICD10-I70.0). Electronically Signed   By: Lowella Grip III M.D.   On: 03/30/2017 08:35    Medications:  I have reviewed the patient's current medications. Scheduled: . chlorhexidine  15 mL Mouth Rinse BID  . hydrochlorothiazide  12.5 mg Oral Daily  . mouth rinse  15 mL Mouth Rinse q12n4p  . metoprolol tartrate  25 mg Oral BID  . pantoprazole (PROTONIX) IV  40 mg Intravenous QHS    Assessment/Plan: Patient with ICH.  Somewhat improved on evaluation today but continues to have expressive and receptive aphasia.  BP controlled.  Carotid dopplers show no evidence of hemodynamically significant stenosis.  Echocardiogram shows no cardiac source of emboli with an EF of 55%.  Ascending aorta atherosclerosis noted.   Recommendations: 1.  HgbA1c, fasting lipid panel 2.  Continue therapy    LOS: 1 day   Alexis Goodell, MD Neurology [phone] 03/30/2017  9:46 AM

## 2017-03-30 NOTE — Progress Notes (Signed)
Pharmacy Electrolyte Monitoring Consult:  Pharmacy consulted to assist in monitoring and replacing electrolytes in this 83 y.o. female admitted on 03/28/2017 with Fall   Labs:  Sodium (mmol/L)  Date Value  03/30/2017 137  10/14/2016 147 (H)  08/06/2014 137   Potassium (mmol/L)  Date Value  03/30/2017 3.0 (L)  08/06/2014 3.3 (L)   Magnesium (mg/dL)  Date Value  03/30/2017 2.1   Phosphorus (mg/dL)  Date Value  10/14/2016 3.8   Calcium (mg/dL)  Date Value  03/30/2017 8.7 (L)   Calcium, Total (mg/dL)  Date Value  08/06/2014 9.3   Albumin (g/dL)  Date Value  03/28/2017 3.7  10/14/2016 4.4  08/06/2014 3.7    Plan: KCl 10 meq IV x 3 ordered per CCM. Will order additional replacement with oral potassium if tolerated. If not will further replace with IV KCl and repeat labs in AM.   Ulice Dash D 03/30/2017 8:52 AM

## 2017-03-30 NOTE — Progress Notes (Signed)
OT Cancellation Note  Patient Details Name: Alexandra Glass MRN: [id number] DOB: 07-24-1924   Cancelled Treatment:    Reason Eval/Treat Not Completed: Patient at procedure or test/ unavailable. Pt currently unavailable due to  testing (chest CT, MRA head/neck, MRI). Will re-attempt next date as medically appropriate.  Jeni Salles, MPH, MS, OTR/L ascom [phone] 03/30/17, 1:32 PM

## 2017-03-30 NOTE — Progress Notes (Signed)
[name] NAME: Alexandra Glass    MR#:  [id number]  DATE OF BIRTH:  [birth year]  SUBJECTIVE: Admitted yesterday for aphasia, found to have intracranial hemorrhage.  Admitted to ICU because of controlling the blood pressure with nicardipine drip.  Today patient looks more alert than yesterday and following simple commands.  However she is drifting back to sleep.  Family at bedside.  CHIEF COMPLAINT:   Chief Complaint  Patient presents with  . Fall    REVIEW OF SYSTEMS:    Review of Systems  Unable to perform ROS: Mental acuity  Patient unable to give review of systems because she is having episodes of altered mental status.  Nutrition:  Tolerating Diet: Tolerating PT:      DRUG ALLERGIES:   Allergies  Allergen Reactions  . No Known Allergies     VITALS:  Blood pressure 122/68, pulse 68, temperature 98.2 F (36.8 C), temperature source Oral, resp. rate 20, height 5\' 4"  (1.626 m), weight 63.6 kg (140 lb 3.4 oz), SpO2 95 %.  PHYSICAL EXAMINATION:   Physical Exam  GENERAL:  83 y.o.-year-old patient lying in the bed with no acute distress.  Patient has vitiligo. EYES: Pupils equal, round, reactive to light and accommodation. No scleral icterus. Extraocular muscles intact.  HEENT: Head atraumatic, normocephalic. Oropharynx and nasopharynx clear.  NECK:  Supple, no jugular venous distention. No thyroid enlargement, no tenderness.  LUNGS: Normal breath sounds bilaterally, no wheezing, rales,rhonchi or crepitation. No use of accessory muscles of respiration.  CARDIOVASCULAR: S1, S2 normal. No murmurs, rubs, or gallops.  ABDOMEN: Soft, nontender, nondistended. Bowel sounds present. No organomegaly or mass.  EXTREMITIES: No pedal edema, cyanosis, or clubbing.  NEUROLOGIC: Cranial nerves II through XII are intact.  Decreased strength bilaterally, patient able to follow some commands but she appears stronger in the upper  extremities.  No power in lower extremities.  He patient is alert and oriented x 3.  SKIN: No obvious rash, lesion, or ulcer.    LABORATORY PANEL:   CBC Recent Labs  Lab 03/28/17 2200  WBC 8.7  HGB 11.8*  HCT 36.4  PLT 176   ------------------------------------------------------------------------------------------------------------------  Chemistries  Recent Labs  Lab 03/28/17 2200  03/30/17 0539  NA 140   < > 137  K 3.3*   < > 3.0*  CL 109   < > 108  CO2 23   < > 20*  GLUCOSE 148*   < > 128*  BUN 23*   < > 18  CREATININE 0.82   < > 0.60  CALCIUM 8.8*   < > 8.7*  MG  --    < > 2.1  AST 26  --   --   ALT 13*  --   --   ALKPHOS 49  --   --   BILITOT 0.7  --   --    < > = values in this interval not displayed.   ------------------------------------------------------------------------------------------------------------------  Cardiac Enzymes No results for input(s): TROPONINI in the last 168 hours. ------------------------------------------------------------------------------------------------------------------  RADIOLOGY:  Ct Head Wo Contrast  Result Date: 03/28/2017 CLINICAL DATA:  Patient found down today. History of breast carcinoma. EXAM: CT HEAD WITHOUT CONTRAST CT CERVICAL SPINE WITHOUT CONTRAST TECHNIQUE: Multidetector CT imaging of the head and cervical spine was performed following the standard protocol without intravenous contrast. Multiplanar CT image reconstructions of the cervical spine were also generated. COMPARISON:  Head CT scan 11/27/2015. Brain MRI 09/28/2015. PET CT  scan 05/24/2012. FINDINGS: CT HEAD FINDINGS Brain: A large hematoma is identified in the left frontal lobe measuring approximately 3.1 cm craniocaudal by 3.3 cm AP by 1.8 cm transverse for a volume of 9 cc. A small tiny amount of hemorrhage is seen in the anterior aspect of the third ventricle or foraminal of [place]ro. There is a small amount of subarachnoid hemorrhage over the left frontal  convexities. The patient's hematoma results in approximately 0.7 cm left right midline shift. There is atrophy and chronic microvascular ischemic change. Remote left PCA infarct is noted. Vascular: Extensive atherosclerosis is seen. Skull: Intact without focal lesion. Sinuses/Orbits: Negative. Other: None. CT CERVICAL SPINE FINDINGS Alignment: Maintained. Skull base and vertebrae: No acute fracture. No primary bone lesion or focal pathologic process. Soft tissues and spinal canal: No prevertebral fluid or swelling. No visible canal hematoma. Disc levels: Loss of disc space height and endplate spurring are worst at C6-7. Upper chest: Emphysematous change is seen in the apices. There is partial visualization of biapical opacities. Other: None. IMPRESSION: Large intraparenchymal hematoma in the left frontal lobe. Underlying mass lesion is possible. 0.7 cm left right midline shift is present. Atrophy and extensive chronic microvascular ischemic change. Remote left MCA infarct noted. Partial visualization of biapical opacities. Correlation with PA and lateral chest is recommended. Emphysema. Critical Value/emergent results were called by telephone at the time of interpretation on 03/28/2017 at 9:19 pm to Dr. Carrie Mew , who verbally acknowledged these results. Electronically Signed   By: Inge Rise M.D.   On: 03/28/2017 21:22   Ct Chest Wo Contrast  Result Date: 03/30/2017 CLINICAL DATA:  Followup lung nodule. EXAM: CT CHEST WITHOUT CONTRAST TECHNIQUE: Multidetector CT imaging of the chest was performed following the standard protocol without IV contrast. COMPARISON:  06/08/2016 FINDINGS: Cardiovascular: Moderate cardiac enlargement. There is aortic atherosclerosis noted. Calcifications within the LAD and left circumflex and RCA coronary artery is noted. No pericardial effusion identified. Stable appearance of the thoracic aorta. The descending thoracic aorta is increased in caliber measuring 4.1 cm.  Mediastinum/Nodes: The trachea appears patent and is midline. Normal appearance of the esophagus. 9 mm right paratracheal lymph node is unchanged from previous exam. Within the limitations of unenhanced technique there is no hilar adenopathy. No enlarged supraclavicular or axillary nodes identified. Lungs/Pleura: Moderate changes of emphysema. Masslike architectural distortion within the medial aspect of the right upper lobe measures 2.2 by 1.3 by 4.3 cm, image 58 of series 5 and image 37 of series 2. On the previous exam this measured 2.3 x 1.6 by 4.3 cm. Index spiculated nodule within the left upper lobe measures 6 x 5 mm, image 40 of series 3. Unchanged from previous exam. Ground-glass attenuating nodule within the anterolateral left apex is stable measuring 7 mm, image 22 of series 3. Upper Abdomen: Unchanged left lobe of liver cyst measuring 1.9 cm, image 113 of series 2. No acute abnormality noted within the upper abdomen. Unchanged partially calcified lesion arising from the upper pole of left kidney. Incompletely characterized without IV contrast. Musculoskeletal: No aggressive lytic or sclerotic bone lesions identified. IMPRESSION: 1. Stable appearance of bilateral pulmonary lesions including masslike architectural distortion within the medial right upper lobe, ground-glass attenuating nodule in the left apex and spiculated nodule in the left upper lobe. 2. Aortic Atherosclerosis (ICD10-I70.0) and Emphysema (ICD10-J43.9). 3. Stable aneurysmal dilatation of the descending thoracic aorta. Electronically Signed   By: Kerby Moors M.D.   On: 03/30/2017 15:29   Ct Cervical Spine Wo Contrast  Result Date: 03/28/2017 CLINICAL DATA:  Patient found down today. History of breast carcinoma. EXAM: CT HEAD WITHOUT CONTRAST CT CERVICAL SPINE WITHOUT CONTRAST TECHNIQUE: Multidetector CT imaging of the head and cervical spine was performed following the standard protocol without intravenous contrast. Multiplanar CT  image reconstructions of the cervical spine were also generated. COMPARISON:  Head CT scan 11/27/2015. Brain MRI 09/28/2015. PET CT scan 05/24/2012. FINDINGS: CT HEAD FINDINGS Brain: A large hematoma is identified in the left frontal lobe measuring approximately 3.1 cm craniocaudal by 3.3 cm AP by 1.8 cm transverse for a volume of 9 cc. A small tiny amount of hemorrhage is seen in the anterior aspect of the third ventricle or foraminal of Monro. There is a small amount of subarachnoid hemorrhage over the left frontal convexities. The patient's hematoma results in approximately 0.7 cm left right midline shift. There is atrophy and chronic microvascular ischemic change. Remote left PCA infarct is noted. Vascular: Extensive atherosclerosis is seen. Skull: Intact without focal lesion. Sinuses/Orbits: Negative. Other: None. CT CERVICAL SPINE FINDINGS Alignment: Maintained. Skull base and vertebrae: No acute fracture. No primary bone lesion or focal pathologic process. Soft tissues and spinal canal: No prevertebral fluid or swelling. No visible canal hematoma. Disc levels: Loss of disc space height and endplate spurring are worst at C6-7. Upper chest: Emphysematous change is seen in the apices. There is partial visualization of biapical opacities. Other: None. IMPRESSION: Large intraparenchymal hematoma in the left frontal lobe. Underlying mass lesion is possible. 0.7 cm left right midline shift is present. Atrophy and extensive chronic microvascular ischemic change. Remote left MCA infarct noted. Partial visualization of biapical opacities. Correlation with PA and lateral chest is recommended. Emphysema. Critical Value/emergent results were called by telephone at the time of interpretation on 03/28/2017 at 9:19 pm to Dr. Carrie Mew , who verbally acknowledged these results. Electronically Signed   By: Inge Rise M.D.   On: 03/28/2017 21:22   Mr Jodene Nam Head Wo Contrast  Result Date: 03/30/2017 CLINICAL DATA:   Patient found unresponsive after a fall. Past history of breast and lung cancer. EXAM: MRI HEAD WITHOUT CONTRAST MRA HEAD WITHOUT CONTRAST MRA NECK WITHOUT CONTRAST TECHNIQUE: Multiplanar, multiecho pulse sequences of the brain and surrounding structures were obtained without intravenous contrast. Angiographic images of the Circle of Willis were obtained using MRA technique without intravenous contrast. Angiographic images of the neck were obtained using MRA technique without intravenous contrast. Carotid stenosis measurements (when applicable) are obtained utilizing NASCET criteria, using the distal internal carotid diameter as the denominator. COMPARISON:  CT head 03/28/2017.  MR head 09/28/2015. FINDINGS: MRI HEAD FINDINGS Brain: Large intraparenchymal hemorrhage LEFT frontal lobe, with partial retraction of the clot. Predominant signal characteristic is T2 shortening representing Deoxyhemoglobin, although there is some T1 shortening consistent with conversion to methemoglobin. Cross-sectional measurements of 18 x 45 x 49 mm, corresponding to a volume of approximately 20 mL. LEFT-to-RIGHT anterior frontal lobe shift under the falx. LEFT-to-RIGHT shift at the level of the septum pellucidum is approximately 4 mm. Mild surrounding edema. Artifactual bright signal on diffusion-weighted imaging, due to blood products. Extreme atrophy. Chronic microvascular ischemic change of an advanced nature. 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 Chronic bland infarcts of the LEFT frontal and temporal lobes, which were acute in 2017. No contrast was given, although the internal architecture of the bleed does not suggest an underlying mass lesion. Vascular: Reported separately. Skull and upper cervical spine: Limited visualization. No marrow destructive process. Sinuses/Orbits: Negative. Other:  None. MRA HEAD FINDINGS Motion degraded exam. There is patency of the cervical internal carotid arteries  throughout their upper cervical and petrous segments. Signal loss in the BILATERAL cavernous ICA segments appears to be related to motion degradation rather than discrete lesion. BILATERAL ICA termini widely patent. There is no RIGHT or LEFT flow-limiting stenosis of the M1 MCA segments. BILATERAL anterior cerebral arteries are patent, with LEFT-to-RIGHT shift of the A3 segments. Artifactual signal loss of the BILATERAL MCA bifurcations. Basilar artery is patent with mild irregularity proximally. The RIGHT vertebral is the dominant contributor to the basilar, with irregularity in the V4 segment. The LEFT vertebral is non dominant, with a severe stenosis at its junction with the basilar. Mild irregularity of both PCA segments, non flow reducing. BILATERAL superior cerebellar arteries are unremarkable. Poorly visualized anterior inferior and posterior inferior cerebellar arteries. No enlarged anterior circulation vessels to suggest arteriovenous malformation. No evidence for saccular aneurysm, although BILATERAL MCA bifurcations are poorly visualized due to artifactual signal loss. MRA NECK FINDINGS Noncontrast examination was performed. This examination is targeted to the carotid bifurcations and does not include the arch. The visualized portions of the common carotid arteries are widely patent. There is no flow-limiting stenosis, or dissection involving the carotid bifurcations. Proximal internal carotid artery segments are widely patent. There is no dissection, fibromuscular change, or pseudoaneurysm involving the cervical ICA segments. Both vertebral arteries are widely patent throughout their neck to the skullbase, with the RIGHT dominant. IMPRESSION: Large intraparenchymal hemorrhage in the LEFT frontal lobe is redemonstrated, corresponding to a late acute/ early subacute time course. No specific etiology is identified on this noncontrast exam, but considerations include a lobar hypertensive bleed, hemorrhage  due to cerebral amyloid angiopathy, or a posttraumatic intracerebral hemorrhage. Embolic infarct with reperfusion, arteriovenous malformation, or hemorrhagic metastasis, are less likely. The lesion has decompressed somewhat into the ventricular system, with cross-sectional measurements of the hematoma cavity corresponding to approximate 20 mL volume. 4 mm of LEFT-to-RIGHT shift. Extreme atrophy and small vessel disease. Motion degraded intracranial MRA demonstrates no flow-limiting stenosis or enlarged feeding vessels. Extracranial MRA demonstrates no flow-limiting stenosis in the anterior circulation. There is a severe stenosis of the non dominant distal vertebral artery at its junction with the basilar. Electronically Signed   By: Staci Righter M.D.   On: 03/30/2017 14:14   Mr Jodene Nam Neck Wo Contrast  Result Date: 03/30/2017 CLINICAL DATA:  Patient found unresponsive after a fall. Past history of breast and lung cancer. EXAM: MRI HEAD WITHOUT CONTRAST MRA HEAD WITHOUT CONTRAST MRA NECK WITHOUT CONTRAST TECHNIQUE: Multiplanar, multiecho pulse sequences of the brain and surrounding structures were obtained without intravenous contrast. Angiographic images of the Circle of Willis were obtained using MRA technique without intravenous contrast. Angiographic images of the neck were obtained using MRA technique without intravenous contrast. Carotid stenosis measurements (when applicable) are obtained utilizing NASCET criteria, using the distal internal carotid diameter as the denominator. COMPARISON:  CT head 03/28/2017.  MR head 09/28/2015. FINDINGS: MRI HEAD FINDINGS Brain: Large intraparenchymal hemorrhage LEFT frontal lobe, with partial retraction of the clot. Predominant signal characteristic is T2 shortening representing Deoxyhemoglobin, although there is some T1 shortening consistent with conversion to methemoglobin. Cross-sectional measurements of 18 x 45 x 49 mm, corresponding to a volume of approximately 20  mL. LEFT-to-RIGHT anterior frontal lobe shift under the falx. LEFT-to-RIGHT shift at the level of the septum pellucidum is approximately 4 mm. Mild surrounding edema. Artifactual bright signal on diffusion-weighted imaging, due to blood products. Extreme  atrophy. Chronic microvascular ischemic change of an advanced nature. 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 Chronic bland infarcts of the LEFT frontal and temporal lobes, which were acute in 2017. No contrast was given, although the internal architecture of the bleed does not suggest an underlying mass lesion. Vascular: Reported separately. Skull and upper cervical spine: Limited visualization. No marrow destructive process. Sinuses/Orbits: Negative. Other: None. MRA HEAD FINDINGS Motion degraded exam. There is patency of the cervical internal carotid arteries throughout their upper cervical and petrous segments. Signal loss in the BILATERAL cavernous ICA segments appears to be related to motion degradation rather than discrete lesion. BILATERAL ICA termini widely patent. There is no RIGHT or LEFT flow-limiting stenosis of the M1 MCA segments. BILATERAL anterior cerebral arteries are patent, with LEFT-to-RIGHT shift of the A3 segments. Artifactual signal loss of the BILATERAL MCA bifurcations. Basilar artery is patent with mild irregularity proximally. The RIGHT vertebral is the dominant contributor to the basilar, with irregularity in the V4 segment. The LEFT vertebral is non dominant, with a severe stenosis at its junction with the basilar. Mild irregularity of both PCA segments, non flow reducing. BILATERAL superior cerebellar arteries are unremarkable. Poorly visualized anterior inferior and posterior inferior cerebellar arteries. No enlarged anterior circulation vessels to suggest arteriovenous malformation. No evidence for saccular aneurysm, although BILATERAL MCA bifurcations are poorly visualized due to artifactual signal  loss. MRA NECK FINDINGS Noncontrast examination was performed. This examination is targeted to the carotid bifurcations and does not include the arch. The visualized portions of the common carotid arteries are widely patent. There is no flow-limiting stenosis, or dissection involving the carotid bifurcations. Proximal internal carotid artery segments are widely patent. There is no dissection, fibromuscular change, or pseudoaneurysm involving the cervical ICA segments. Both vertebral arteries are widely patent throughout their neck to the skullbase, with the RIGHT dominant. IMPRESSION: Large intraparenchymal hemorrhage in the LEFT frontal lobe is redemonstrated, corresponding to a late acute/ early subacute time course. No specific etiology is identified on this noncontrast exam, but considerations include a lobar hypertensive bleed, hemorrhage due to cerebral amyloid angiopathy, or a posttraumatic intracerebral hemorrhage. Embolic infarct with reperfusion, arteriovenous malformation, or hemorrhagic metastasis, are less likely. The lesion has decompressed somewhat into the ventricular system, with cross-sectional measurements of the hematoma cavity corresponding to approximate 20 mL volume. 4 mm of LEFT-to-RIGHT shift. Extreme atrophy and small vessel disease. Motion degraded intracranial MRA demonstrates no flow-limiting stenosis or enlarged feeding vessels. Extracranial MRA demonstrates no flow-limiting stenosis in the anterior circulation. There is a severe stenosis of the non dominant distal vertebral artery at its junction with the basilar. Electronically Signed   By: Staci Righter M.D.   On: 03/30/2017 14:14   Mr Brain Wo Contrast  Result Date: 03/30/2017 CLINICAL DATA:  Patient found unresponsive after a fall. Past history of breast and lung cancer. EXAM: MRI HEAD WITHOUT CONTRAST MRA HEAD WITHOUT CONTRAST MRA NECK WITHOUT CONTRAST TECHNIQUE: Multiplanar, multiecho pulse sequences of the brain and  surrounding structures were obtained without intravenous contrast. Angiographic images of the Circle of Willis were obtained using MRA technique without intravenous contrast. Angiographic images of the neck were obtained using MRA technique without intravenous contrast. Carotid stenosis measurements (when applicable) are obtained utilizing NASCET criteria, using the distal internal carotid diameter as the denominator. COMPARISON:  CT head 03/28/2017.  MR head 09/28/2015. FINDINGS: MRI HEAD FINDINGS Brain: Large intraparenchymal hemorrhage LEFT frontal lobe, with partial retraction of the clot. Predominant signal characteristic is T2  shortening representing Deoxyhemoglobin, although there is some T1 shortening consistent with conversion to methemoglobin. Cross-sectional measurements of 18 x 45 x 49 mm, corresponding to a volume of approximately 20 mL. LEFT-to-RIGHT anterior frontal lobe shift under the falx. LEFT-to-RIGHT shift at the level of the septum pellucidum is approximately 4 mm. Mild surrounding edema. Artifactual bright signal on diffusion-weighted imaging, due to blood products. Extreme atrophy. Chronic microvascular ischemic change of an advanced nature. 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 Chronic bland infarcts of the LEFT frontal and temporal lobes, which were acute in 2017. No contrast was given, although the internal architecture of the bleed does not suggest an underlying mass lesion. Vascular: Reported separately. Skull and upper cervical spine: Limited visualization. No marrow destructive process. Sinuses/Orbits: Negative. Other: None. MRA HEAD FINDINGS Motion degraded exam. There is patency of the cervical internal carotid arteries throughout their upper cervical and petrous segments. Signal loss in the BILATERAL cavernous ICA segments appears to be related to motion degradation rather than discrete lesion. BILATERAL ICA termini widely patent. There is no RIGHT  or LEFT flow-limiting stenosis of the M1 MCA segments. BILATERAL anterior cerebral arteries are patent, with LEFT-to-RIGHT shift of the A3 segments. Artifactual signal loss of the BILATERAL MCA bifurcations. Basilar artery is patent with mild irregularity proximally. The RIGHT vertebral is the dominant contributor to the basilar, with irregularity in the V4 segment. The LEFT vertebral is non dominant, with a severe stenosis at its junction with the basilar. Mild irregularity of both PCA segments, non flow reducing. BILATERAL superior cerebellar arteries are unremarkable. Poorly visualized anterior inferior and posterior inferior cerebellar arteries. No enlarged anterior circulation vessels to suggest arteriovenous malformation. No evidence for saccular aneurysm, although BILATERAL MCA bifurcations are poorly visualized due to artifactual signal loss. MRA NECK FINDINGS Noncontrast examination was performed. This examination is targeted to the carotid bifurcations and does not include the arch. The visualized portions of the common carotid arteries are widely patent. There is no flow-limiting stenosis, or dissection involving the carotid bifurcations. Proximal internal carotid artery segments are widely patent. There is no dissection, fibromuscular change, or pseudoaneurysm involving the cervical ICA segments. Both vertebral arteries are widely patent throughout their neck to the skullbase, with the RIGHT dominant. IMPRESSION: Large intraparenchymal hemorrhage in the LEFT frontal lobe is redemonstrated, corresponding to a late acute/ early subacute time course. No specific etiology is identified on this noncontrast exam, but considerations include a lobar hypertensive bleed, hemorrhage due to cerebral amyloid angiopathy, or a posttraumatic intracerebral hemorrhage. Embolic infarct with reperfusion, arteriovenous malformation, or hemorrhagic metastasis, are less likely. The lesion has decompressed somewhat into the  ventricular system, with cross-sectional measurements of the hematoma cavity corresponding to approximate 20 mL volume. 4 mm of LEFT-to-RIGHT shift. Extreme atrophy and small vessel disease. Motion degraded intracranial MRA demonstrates no flow-limiting stenosis or enlarged feeding vessels. Extracranial MRA demonstrates no flow-limiting stenosis in the anterior circulation. There is a severe stenosis of the non dominant distal vertebral artery at its junction with the basilar. Electronically Signed   By: Staci Righter M.D.   On: 03/30/2017 14:14   Dg Chest Port 1 View  Result Date: 03/30/2017 CLINICAL DATA:  History of breast and renal carcinoma. Intracerebral hemorrhage. EXAM: PORTABLE CHEST 1 VIEW COMPARISON:  Chest radiograph July 07, 2014 and chest CT February 29, 2018 FINDINGS: Port-A-Cath tip is at the cavoatrial junction. No pneumothorax. There is no appreciable edema or consolidation. Heart is mildly enlarged with pulmonary vascularity within normal limits.  Aorta is tortuous with aortic atherosclerosis. No evident adenopathy. Bones appear osteoporotic. No focal bone lesions are evident. IMPRESSION: Port-A-Cath as described without pneumothorax. Cardiomegaly with pulmonary vascularity within normal limits. No edema or consolidation. Tortuous aorta with aortic atherosclerosis noted. Aortic Atherosclerosis (ICD10-I70.0). Electronically Signed   By: Lowella Grip III M.D.   On: 03/30/2017 08:35     ASSESSMENT AND PLAN:   Principal Problem:   ICH (intracerebral hemorrhage) (HCC) Active Problems:   Moderate dementia without behavioral disturbance   Essential hypertension   Cerebral vascular disease   Hypothyroidism   #1.large intracranial hemorrhage in the left frontal area and MRI of the brain confirmed .  Patient has mild midline shift from left to right.  She is more awake and alert and trying to talk a little bit today than yesterday continue dysphagia diet and continue close  monitoring, use nicardipine for BP control.  Discussed with patient's niece at bedside.  2.  Large intracranial hemorrhage: Dr. Cari Caraway and Dr. Doy Mince help is appreciated. 3.  CODE STATUS DNR.      All the records are reviewed and case discussed with Care Management/Social Workerr. Management plans discussed with the patient, family and they are in agreement.  CODE STATUS:DNR  TOTAL TIME TAKING CARE OF THIS PATIENT: 35 minutes.   POSSIBLE D/C IN 3-4DAYS, DEPENDING ON CLINICAL CONDITION.   Epifanio Lesches M.D on 03/30/2017 at 4:41 PM  Between 7am to 6pm - Pager - [phone]  After 6pm go to [email or web address] - password [email or web address] [facility]  Office  [phone]  CC: Primary care physician; Juline Patch, MD

## 2017-03-30 NOTE — Progress Notes (Signed)
OT Cancellation Note  Patient Details Name: Alexandra Glass MRN: [id number] DOB: March 21, 1925   Cancelled Treatment:    Reason Eval/Treat Not Completed: Medical issues which prohibited therapy. Per chart review the pt is in and out of consciousness with MRI pending.  Will hold until pt more medically stable and appropriate.   Jeni Salles, MPH, MS, OTR/L ascom [phone] 03/30/17, 8:40 AM

## 2017-03-30 NOTE — Progress Notes (Addendum)
MRI results reviewed and discussed with neurology and neurosurgery. Today is the first time I am seeing the patient but per Dr. Doy Mince the patient is medically better with improvement in her exam.  Scan results discussed with Dr. Izora Ribas who reviewed the scans and recommends continued medical management.  Truly appreciate Dr. Regenia Skeeter and Dr. Rhea Bleacher assistance.  Continue management as detailed in my note from this AM.  Nettie Elm, MD Pulmonary and Critical Care Medicine.   Addendum: Given the improvement in L > R shift suggesting improvement in ICP, there is no benefit of 3% saline or mannitol at this time. Continue BP support and frequent neuro checks. Repeat CT brain for any change in mental status.  Nettie Elm, MD Pulmonary and Critical Care Medicine.

## 2017-03-30 NOTE — Progress Notes (Signed)
*  PRELIMINARY RESULTS* Echocardiogram 2D Echocardiogram has been performed.  Sherrie Sport 03/30/2017, 2:02 PM

## 2017-03-30 NOTE — Progress Notes (Signed)
I have discussed this case with Dr. Vianne Bulls and reviewed all of the pertinent radiographs.  Ms. Alexandra Glass is a 83 yo female who presented with a L F intraparenchymal hemorrhage with findings consistent with amyloid angiopathy. She has had slow improvement in the intensive care unit.  MRI brain today shows slight interval increase in the size of the L F IPH, though the basal cisterns and other pertinent spinal fluid spaces show patency.    I am not surprised that she has had a slight increase in the size of her IPH.  The IPH will likely evolve with time, and will take several weeks to months to resolve.  With an improvement in clinical exam, I would not recommend any change in course.  Morbidity and mortality of craniotomy in this situation is prohibitive.  I would recommend non-surgical management.  Meade Maw MD Neurosurgery

## 2017-03-30 NOTE — Progress Notes (Addendum)
[facility] Consultation     Date: 03/30/2017,   MRN# [id number] Alexandra Glass [birth year]     AdmissionWeight: 140 lb 3.4 oz (63.6 kg)                 CurrentWeight: 140 lb 3.4 oz (63.6 kg) Alexandra Glass is a 83 y.o. old female    SUBJECTIVE:   No acute issues overnight. Hemodynamically stable. Opens eyes and moves extremities spontaneously.  Occasionally follows commands.  issues overnight  MEDICATIONS     Current Medication:   Current Facility-Administered Medications:  .  0.9 %  sodium chloride infusion, 10 mL/hr, Intravenous, Once, Carrie Mew, MD, Stopped at 03/29/17 0002 .  acetaminophen (TYLENOL) tablet 650 mg, 650 mg, Oral, Q4H PRN **OR** acetaminophen (TYLENOL) solution 650 mg, 650 mg, Per Tube, Q4H PRN **OR** acetaminophen (TYLENOL) suppository 650 mg, 650 mg, Rectal, Q4H PRN, Lance Coon, MD .  chlorhexidine (PERIDEX) 0.12 % solution 15 mL, 15 mL, Mouth Rinse, BID, Kasa, Kurian, MD, 15 mL at 03/30/17 0913 .  hydrALAZINE (APRESOLINE) injection 10-40 mg, 10-40 mg, Intravenous, Q4H PRN, Nettie Elm, MD, 20 mg at 03/30/17 0853 .  hydrochlorothiazide (HYDRODIURIL) tablet 12.5 mg, 12.5 mg, Oral, Daily, Varughese, Bincy S, NP, 12.5 mg at 03/30/17 0913 .  levETIRAcetam (KEPPRA) 500 mg in sodium chloride 0.9 % 100 mL IVPB, 500 mg, Intravenous, Q12H, Carrie Mew, MD, Stopped at 03/30/17 0005 .  MEDLINE mouth rinse, 15 mL, Mouth Rinse, q12n4p, Kasa, Kurian, MD, 15 mL at 03/29/17 1453 .  metoprolol tartrate (LOPRESSOR) tablet 25 mg, 25 mg, Oral, BID, Nettie Elm, MD, 25 mg at 03/30/17 0913 .  nicardipine (CARDENE) 20mg  in 0.86% saline 247ml IV infusion (0.1 mg/ml), 0-15 mg/hr, Intravenous, Continuous, Nettie Elm, MD, Last Rate: 50 mL/hr at 03/30/17 0913, 5 mg/hr at 03/30/17 0913 .  pantoprazole (PROTONIX) injection 40 mg, 40 mg, Intravenous, Corwin Levins, MD, 40 mg at 03/29/17 2214 .  potassium chloride 10 mEq in 100 mL IVPB, 10 mEq,  Intravenous, Q1 Hr x 3, Nettie Elm, MD, Last Rate: 100 mL/hr at 03/30/17 0939, 10 mEq at 03/30/17 3151  Facility-Administered Medications Ordered in Other Encounters:  .  sodium chloride flush (NS) 0.9 % injection 10 mL, 10 mL, Intravenous, PRN, Mike Gip, Melissa C, MD, 10 mL at 01/11/17 1029     REVIEW OF SYSTEMS     VS: BP 101/82   Pulse 76   Temp 98.2 F (36.8 C) (Oral)   Resp 14   Ht 5\' 4"  (1.626 m)   Wt 140 lb 3.4 oz (63.6 kg)   SpO2 98%   BMI 24.07 kg/m      PHYSICAL EXAM   Awake. Opens eyes spontaneously. Pupils equal bilaterally. Moves extremities spontaneously. Occasionally follows commands.  Was able to lift both her arms up on my command. Hard of hearing. CVS S1, S2, 0. Chest is clear to auscultation bilaterally with no rales rhonchi or wheezes. Abdomen is soft, nontender, nondistended.  Bowel sounds are positive. No lower extremity edema.    LABS    Recent Labs    03/28/17 2200 03/29/17 1337 03/30/17 0539  HGB 11.8*  --   --   HCT 36.4  --   --   MCV 78.4*  --   --   WBC 8.7  --   --   BUN 23* 19 18  CREATININE 0.82 0.68 0.60  GLUCOSE 148* 128* 128*  CALCIUM 8.8* 8.4* 8.7*  ,    No results  for input(s): PH in the last 72 hours.  Invalid input(s): PCO2, PO2, BASEEXCESS, BASEDEFICITE, TFT    CULTURE RESULTS   Recent Results (from the past 240 hour(s))  Urine culture     Status: None   Collection Time: 03/28/17  8:18 PM  Result Value Ref Range Status   Specimen Description URINE, CATHETERIZED  Final   Special Requests NONE  Final   Culture   Final    NO GROWTH Performed at [facility], [address] [place], [place] [address]    Report Status 03/30/2017 FINAL  Final  MRSA PCR Screening     Status: None   Collection Time: 03/29/17  2:42 AM  Result Value Ref Range Status   MRSA by PCR NEGATIVE NEGATIVE Final    Comment:        The GeneXpert MRSA Assay (FDA approved for NASAL specimens only), is one component of  a comprehensive MRSA colonization surveillance program. It is not intended to diagnose MRSA infection nor to guide or monitor treatment for MRSA infections.           IMAGING    Dg Chest Port 1 View  Result Date: 03/30/2017 CLINICAL DATA:  History of breast and renal carcinoma. Intracerebral hemorrhage. EXAM: PORTABLE CHEST 1 VIEW COMPARISON:  Chest radiograph July 07, 2014 and chest CT February 29, 2018 FINDINGS: Port-A-Cath tip is at the cavoatrial junction. No pneumothorax. There is no appreciable edema or consolidation. Heart is mildly enlarged with pulmonary vascularity within normal limits. Aorta is tortuous with aortic atherosclerosis. No evident adenopathy. Bones appear osteoporotic. No focal bone lesions are evident. IMPRESSION: Port-A-Cath as described without pneumothorax. Cardiomegaly with pulmonary vascularity within normal limits. No edema or consolidation. Tortuous aorta with aortic atherosclerosis noted. Aortic Atherosclerosis (ICD10-I70.0). Electronically Signed   By: Lowella Grip III M.D.   On: 03/30/2017 08:35         ASSESSMENT/PLAN     83 years old lady with a past medical history significant for breast cancer status post mastectomy and chemotherapy radiation, hypertension, renal carcinoma, thyroid disorder, left MCA infarct who has been admitted to the hospital with a traumatic left frontal ICH.  Problem list Traumatic left frontal ICH with a 0.7 cm left to right shift Hypokalemia History of left MCA infarct History of hypertension History of renal carcinoma History of breast cancer status post mastectomy, chemotherapy and radiation Left lung nodules-enlarging  Continue neurochecks q. one hours. Patient is on nicardipine drip.  Target systolic blood pressure less than 140.  Titrate nicardipine drip to achieve target systolic blood pressures. Increase metoprolol to 25 mg twice daily p.o. Continue HCTZ Continue Keppra twice daily As needed  hydralazine. Continue holding home aspirin MRI/MRA per neurology.  This will help see interval change in the size of intraparenchymal ICH as well as shift. Echocardiogram per neurology. Now that the patient is getting an MRA, I do not think there is a need to get carotid Dopplers.  We will look into the chart to see if the patient got platelets on admission given that she was on aspirin.  However, if there is no change in the size of intracranial hemorrhage, given that the patient is more than 2 days out of the hemorrhage, at this time giving platelets would not make a difference.  Patient has left lung nodule on CT chest from February 2018 that was slowly increasing in size compared to before.  I did not see repeat follow-up studies.  Given her history of  breast cancer, this is concerning for metastases and needs to be further followed up. We will repeat a CT chest without contrast.  Replace potassium IV.  Patient has passed a swallow evaluation and is getting p.o. medications with pured diet.  Start home Aricept and [employer] home Lipitor Start home Synthroid Start home Xalatan  PT / OT/Speech therapy  Protonix for stress ulcer prophylaxis SCDs for DVT prophylaxis  DNR/DNI  Continue ICU care until the patient comes off nicardipine drip and remains off of it.  Critical care time spent was 45 minutes   Nettie Elm, M.D.  Pulmonary & Critical Care Medicine  Addendum:  Review of chart shows that the patient received 1 unit of platelets on 03/28/17 Also check blood glucose every 6 4 hours for appropriate glycemic control  Nettie Elm, M.D.  Pulmonary & Critical Care Medicine

## 2017-03-30 NOTE — Progress Notes (Signed)
PT Cancellation Note  Patient Details Name: Alexandra Glass MRN: [id number] DOB: Jun 16, 1924   Cancelled Treatment:    Reason Eval/Treat Not Completed: Medical issues which prohibited therapy.  Per chart review the pt is in and out of consciousness with MRI pending.  Will hold until pt more medically stable and appropriate.    Collie Siad PT, DPT 03/30/2017, 8:36 AM

## 2017-03-31 ENCOUNTER — Inpatient Hospital Stay (HOSPITAL_COMMUNITY): Payer: Medicare Other

## 2017-03-31 DIAGNOSIS — R4701 Aphasia: Secondary | ICD-10-CM

## 2017-03-31 DIAGNOSIS — I612 Nontraumatic intracerebral hemorrhage in hemisphere, unspecified: Secondary | ICD-10-CM

## 2017-03-31 DIAGNOSIS — Z515 Encounter for palliative care: Secondary | ICD-10-CM

## 2017-03-31 DIAGNOSIS — R1310 Dysphagia, unspecified: Secondary | ICD-10-CM

## 2017-03-31 DIAGNOSIS — F0390 Unspecified dementia without behavioral disturbance: Secondary | ICD-10-CM

## 2017-03-31 DIAGNOSIS — Z7189 Other specified counseling: Secondary | ICD-10-CM

## 2017-03-31 LAB — PHOSPHORUS: Phosphorus: 2.1 mg/dL — ABNORMAL LOW (ref 2.5–4.6)

## 2017-03-31 LAB — BASIC METABOLIC PANEL
Anion gap: 7 (ref 5–15)
BUN: 28 mg/dL — AB (ref 6–20)
CO2: 21 mmol/L — ABNORMAL LOW (ref 22–32)
Calcium: 8.6 mg/dL — ABNORMAL LOW (ref 8.9–10.3)
Chloride: 110 mmol/L (ref 101–111)
Creatinine, Ser: 0.58 mg/dL (ref 0.44–1.00)
GFR calc Af Amer: >60 mL/min (ref >60)
GLUCOSE: 147 mg/dL — AB (ref 65–99)
POTASSIUM: 3 mmol/L — AB (ref 3.5–5.1)
Sodium: 138 mmol/L (ref 135–145)

## 2017-03-31 LAB — ECHOCARDIOGRAM COMPLETE
HEIGHTINCHES: 64 in
Weight: 2243.4 oz

## 2017-03-31 LAB — LIPID PANEL
CHOL/HDL RATIO: 2.3 ratio
Cholesterol: 167 mg/dL (ref 0–200)
HDL: 72 mg/dL (ref >40)
LDL CALC: 83 mg/dL (ref 0–99)
Triglycerides: 58 mg/dL (ref <150)
VLDL: 12 mg/dL (ref 0–40)

## 2017-03-31 LAB — GLUCOSE, CAPILLARY
GLUCOSE-CAPILLARY: 156 mg/dL — AB (ref 65–99)
GLUCOSE-CAPILLARY: 145 mg/dL — AB (ref 65–99)
Glucose-Capillary: 124 mg/dL — ABNORMAL HIGH (ref 65–99)
Glucose-Capillary: 133 mg/dL — ABNORMAL HIGH (ref 65–99)

## 2017-03-31 MED ORDER — HYDROCHLOROTHIAZIDE 25 MG PO TABS
25.0000 mg | ORAL_TABLET | Freq: Every day | ORAL | Status: DC
  Administered 2017-03-31 – 2017-04-05 (×6): 25 mg via ORAL
  Filled 2017-03-31 (×6): qty 1

## 2017-03-31 MED ORDER — POTASSIUM PHOSPHATES 15 MMOLE/5ML IV SOLN
20.0000 mmol | Freq: Once | INTRAVENOUS | Status: AC
  Administered 2017-03-31: 20 mmol via INTRAVENOUS
  Filled 2017-03-31: qty 6.67

## 2017-03-31 MED ORDER — SODIUM CHLORIDE 0.9 % IV SOLN
Freq: Once | INTRAVENOUS | Status: AC
  Administered 2017-03-31: 11:00:00 via INTRAVENOUS
  Filled 2017-03-31: qty 250

## 2017-03-31 MED ORDER — POTASSIUM CHLORIDE 20 MEQ PO PACK
40.0000 meq | PACK | Freq: Once | ORAL | Status: AC
  Administered 2017-03-31: 40 meq via ORAL
  Filled 2017-03-31: qty 2

## 2017-03-31 MED ORDER — LISINOPRIL 5 MG PO TABS
5.0000 mg | ORAL_TABLET | Freq: Every day | ORAL | Status: DC
  Administered 2017-03-31: 5 mg via ORAL
  Filled 2017-03-31: qty 1

## 2017-03-31 NOTE — Progress Notes (Addendum)
[facility] Consultation     Date: 03/31/2017,   MRN# [id number] Alexandra Glass 01-Jan-1925     AdmissionWeight: 140 lb 3.4 oz (63.6 kg)                 CurrentWeight: 140 lb 3.4 oz (63.6 kg) Alexandra Glass is a 83 y.o. old female    SUBJECTIVE:   Got agitated yesterday evening and had to be started on Precedex drip.  Has been off Precedex drip since this morning Hemodynamically stable. Occasionally follows commands. Remains on nicardipine drip  issues overnight  MEDICATIONS     Current Medication:   Current Facility-Administered Medications:  .  0.9 %  sodium chloride infusion, 10 mL/hr, Intravenous, Once, Carrie Mew, MD, Stopped at 03/29/17 0002 .  acetaminophen (TYLENOL) tablet 650 mg, 650 mg, Oral, Q4H PRN **OR** acetaminophen (TYLENOL) solution 650 mg, 650 mg, Per Tube, Q4H PRN **OR** acetaminophen (TYLENOL) suppository 650 mg, 650 mg, Rectal, Q4H PRN, Lance Coon, MD .  atorvastatin (LIPITOR) tablet 20 mg, 20 mg, Oral, q1800, Nettie Elm, MD .  chlorhexidine (PERIDEX) 0.12 % solution 15 mL, 15 mL, Mouth Rinse, BID, Mortimer Fries, Kurian, MD, 15 mL at 03/31/17 0924 .  dexmedetomidine (PRECEDEX) 400 MCG/100ML (4 mcg/mL) infusion, 0.4-1.2 mcg/kg/hr, Intravenous, Titrated, Tukov, Magadalene S, NP, Stopped at 03/31/17 0940 .  donepezil (ARICEPT) tablet 10 mg, 10 mg, Oral, QHS, Nettie Elm, MD .  hydrALAZINE (APRESOLINE) injection 10-40 mg, 10-40 mg, Intravenous, Q4H PRN, Nettie Elm, MD, 20 mg at 03/30/17 2029 .  hydrochlorothiazide (HYDRODIURIL) tablet 25 mg, 25 mg, Oral, Daily, Nettie Elm, MD, 25 mg at 03/31/17 7829 .  latanoprost (XALATAN) 0.005 % ophthalmic solution 1 drop, 1 drop, Both Eyes, QHS, Nettie Elm, MD .  letrozole [facility]) tablet 2.5 mg, 2.5 mg, Oral, Daily, Nettie Elm, MD, 2.5 mg at 03/31/17 5621 .  levETIRAcetam (KEPPRA) 500 mg in sodium chloride 0.9 % 100 mL IVPB, 500 mg, Intravenous, Q12H, Carrie Mew, MD, Stopped at 03/31/17 1035 .   levothyroxine (SYNTHROID, LEVOTHROID) tablet 75 mcg, 75 mcg, Oral, QAC breakfast, Nettie Elm, MD, 75 mcg at 03/31/17 [phone] .  lisinopril (PRINIVIL,ZESTRIL) tablet 5 mg, 5 mg, Oral, Daily, Nettie Elm, MD, 5 mg at 03/31/17 5784 .  MEDLINE mouth rinse, 15 mL, Mouth Rinse, q12n4p, Kasa, Kurian, MD, 15 mL at 03/31/17 1230 .  memantine (NAMENDA) tablet 5 mg, 5 mg, Oral, BID, Nettie Elm, MD, 5 mg at 03/31/17 6962 .  metoprolol tartrate (LOPRESSOR) tablet 25 mg, 25 mg, Oral, BID, Nettie Elm, MD, 25 mg at 03/31/17 9528 .  morphine 2 MG/ML injection 2-4 mg, 2-4 mg, Intravenous, Q3H PRN, Patria Mane, Magadalene S, NP .  nicardipine (CARDENE) 20mg  in 0.86% saline 211ml IV infusion (0.1 mg/ml), 0-15 mg/hr, Intravenous, Continuous, Nettie Elm, MD, Last Rate: 25 mL/hr at 03/31/17 1200, 2.5 mg/hr at 03/31/17 1200 .  pantoprazole (PROTONIX) injection 40 mg, 40 mg, Intravenous, Corwin Levins, MD, 40 mg at 03/30/17 2215 .  potassium PHOSPHATE 20 mmol in dextrose 5 % 500 mL infusion, 20 mmol, Intravenous, Once, Epifanio Lesches, MD  Facility-Administered Medications Ordered in Other Encounters:  .  sodium chloride flush (NS) 0.9 % injection 10 mL, 10 mL, Intravenous, PRN, Corcoran, Melissa C, MD, 10 mL at 01/11/17 1029     REVIEW OF SYSTEMS     VS: BP 127/63   Pulse 63   Temp 98 F (36.7 C) (Oral)   Resp (!) 24   Ht 5\' 4"  (1.626 m)  Wt 140 lb 3.4 oz (63.6 kg)   SpO2 93%   BMI 24.07 kg/m      PHYSICAL EXAM   Awake. Moves extremities spontaneously. Occasionally follows commands.  Squeezed my fingers with both her hands.  Was able to lift both her arms upon command. CVS S1, S2, 0. Chest is clear to auscultation bilaterally with no rales rhonchi or wheezes. Abdomen is soft, nontender, nondistended.  Bowel sounds are positive. No lower extremity edema.    LABS    Recent Labs    03/28/17 2200 03/29/17 1337 03/30/17 0539 03/31/17 0351  HGB 11.8*  --   --   --   HCT 36.4  --    --   --   MCV 78.4*  --   --   --   WBC 8.7  --   --   --   BUN 23* 19 18 28*  CREATININE 0.82 0.68 0.60 0.58  GLUCOSE 148* 128* 128* 147*  CALCIUM 8.8* 8.4* 8.7* 8.6*  ,    No results for input(s): PH in the last 72 hours.  Invalid input(s): PCO2, PO2, BASEEXCESS, BASEDEFICITE, TFT    CULTURE RESULTS   Recent Results (from the past 240 hour(s))  Urine culture     Status: None   Collection Time: 03/28/17  8:18 PM  Result Value Ref Range Status   Specimen Description URINE, CATHETERIZED  Final   Special Requests NONE  Final   Culture   Final    NO GROWTH Performed at [facility], [address] [place], [place] [address]    Report Status 03/30/2017 FINAL  Final  MRSA PCR Screening     Status: None   Collection Time: 03/29/17  2:42 AM  Result Value Ref Range Status   MRSA by PCR NEGATIVE NEGATIVE Final    Comment:        The GeneXpert MRSA Assay (FDA approved for NASAL specimens only), is one component of a comprehensive MRSA colonization surveillance program. It is not intended to diagnose MRSA infection nor to guide or monitor treatment for MRSA infections.           IMAGING    No results found.       ASSESSMENT/PLAN     83 years old lady with a past medical history significant for breast cancer status post mastectomy and chemotherapy radiation, hypertension, renal carcinoma, thyroid disorder, left MCA infarct who has been admitted to the hospital with a traumatic left frontal ICH.  Problem list Traumatic left frontal ICH with a 0.7 cm left to right shift initially that is now reduced to 0.4 cm Hypokalemia History of left MCA infarct History of hypertension History of renal carcinoma History of breast cancer status post mastectomy, chemotherapy and radiation Left lung nodules-enlarging  Continue neurochecks q. one hours. Patient is on nicardipine drip.  Target systolic blood pressure less than 140.  Titrate nicardipine drip to achieve  target systolic blood pressures. The patient still requires nicardipine drip.  Given her relative bradycardia, I cannot increase the metoprolol dose. Continue metoprolol to 25 mg twice daily p.o. Increase HCTZ to 5 mg daily Start lisinopril 5 mg daily Continue Keppra twice daily As needed hydralazine. Continue holding home aspirin MRI/MRA results reviewed with neurology and neurosurgery.  Neurosurgery's note is in chart.  Continue medical management.  No surgical intervention. Follow echocardiogram report  CT chest performed 03/30/17 independently reviewed.  Report noted as well. I compared to the CT scan of the chest  performed on 06/08/16, 12/07/15 and 06/09/15. The right upper lobe medial opacity is a likely radiation fibrosis and scarring and has remained unchanged in size over the last few successive CT scans. The left apical ground glass nodule measures 6 x 5 mm and has remained stable compared to CT chest performed on 06/08/16.  On the CT scan performed on 12/07/15 and 06/09/15 it was noted to be larger in size. Similarly, the LUL solid nodule has remained stable when compared to 06/08/16 but was larger in size on the CT chest performed on 06/09/15. For now, given the patient's ongoing issues, nothing needs to be done for these lung nodules.  However, once the patient's acute issues resolve in her condition improves, given her history of breast CA, further workup including PET/CT to assess for FDG uptake can be considered.  Decision to proceed with further workup should be decided by the team taking care of her at that time based on the patient's condition prior to discharge from the hospital.  Replace potassium IV.  Patient has passed a swallow evaluation and is getting p.o. medications with pured diet.  Continue home Aricept and Namenda Continue home Lipitor Continue home Synthroid Continue home Xalatan  PT / OT/Speech therapy  Protonix for stress ulcer prophylaxis SCDs for DVT  prophylaxis  DNR/DNI  Continue ICU care until the patient comes off nicardipine drip   Critical care time spent was 45 minutes   Nettie Elm, M.D.  Pulmonary & Critical Care Medicine

## 2017-03-31 NOTE — Consult Note (Signed)
Consultation Note Date: 03/31/2017   Patient Name: Alexandra Glass  DOB: 05-20-1924  MRN: [id number]  Age / Sex: 83 y.o., female  PCP: Juline Patch, MD Referring Physician: Epifanio Lesches, MD  Reason for Consultation: Establishing goals of care  HPI/Patient Profile: 83 y.o. female  with past medical history of thyroid disease, renal and breast cancer followed by oncology, CVA, and dementia admitted on 03/28/2017 after found unresponsive by family at home. Head CT revealed left frontal ICH. Neurology and neurosurgery following. No surgical interventions and plan to manage medically. MRI brain 12/13 continues to show left frontal lobe hemorrhage with intraventricular extension and 4m left to right midline shift. MRA reveals severe stenosis of nondominant distal vertebral. Neuro recommending EEG. Followed by SLP and started on dysphagia 1 nectar thick diet. PT/OT following. DNR. Palliative medicine consultation for goals of care.   Clinical Assessment and Goals of Care: I have reviewed medical records, discussed with care team, and met with patient and son ([name] at bedside to discuss diagnosis, prognosis, GOC, EOL wishes, disposition and options. During our conversation, patient will wake to voice. She becomes agitated and pulling at covers and gown. I was able to calm her down quickly and gave her some bites of dysphagia diet. Patient takes frequent encouraging to swallow but tolerated bites without coughing.   Introduced Palliative Medicine as specialized medical care for people living with serious illness. It focuses on providing relief from the symptoms and stress of a serious illness. The goal is to improve quality of life for both the patient and the family.  We discussed a brief life review of the patient. Prior to hospitalization, living home independently and able to perform ADL's including cooking and  raking. She ambulated without cane or walker. LFritz Pickerelspeaks of underlying dementia in the last 5-6 years but with mild confusion. LFritz Pickerelis her only child but she has other supportive family members that visit frequently. Husband died from a stroke in 203-04-03   Discussed events leading up to hospitalization, diagnoses, and interventions. LFritz Pickerelasks on a scale from "1 to 10, where does she fall." Explained small signs of improvement in the last few days including increased responsiveness, ability to pass speech swallow evaluation, and following commands for care team and therapists. Expressed concerns of increased agitation over night requiring precedex (currently stopped) and deconditioning/deficets from stroke. Explained unlikelihood of her returning to prior baseline before stroke and with underlying dementia/age. LFritz Pickerelhas a good understanding of this. He speaks of conversations with other doctors about different outcomes including improvement with discharge to rehab versus "the end of the road."   Advanced directives, concepts specific to code status, and artifical feeding and hydration were discussed. Patient has a documented living will that I reviewed with LFritz Pickerel He confirms her wishes against resuscitation or life support. He speaks of having to withdraw care after his father suffered a stroke and was on life support for two weeks. LFritz Pickereland niece, NIzora Galaare [place] Introduced MOST form and encouraged LFritz Pickerelto review with  Izora Gala. We discussed feeding tube if she does not excel with diet--prolonging life but not give her a better quality of life. Quality versus quantity.   [name] asks about her cancer. Reviewed oncology note with him. Explained that the main concern is her acute ICH at this time but that oncology will continue to follow outpatient.   [name] remains hopeful that his mom will continue to work with therapy and accept pureed food/drink but also realistic of a "long road" of recovery. "Only time  will tell." He is willing to do what is necessary to care for her, including moving into her home and serving as primary caregiver.   Palliative Care services outpatient were explained and offered.   Questions and concerns were addressed. Therapeutic listening and emotional/spiritual support provided.    SUMMARY OF RECOMMENDATIONS    DNR/DNI. Otherwise, continue current interventions.   Introduced MOST form.   Son hopeful for improvement with therapy but also realistic that she will not return to previous, independent self prior to [place].   EEG pending  PMT not at [facility] through the weekend but will f/u next week to further discuss [place] based on clinical status.    Code Status/Advance Care Planning:  DNR  Symptom Management:   Per attending  Palliative Prophylaxis:   Aspiration, Delirium Protocol, Frequent Pain Assessment, Oral Care and Turn Reposition  Additional Recommendations (Limitations, Scope, Preferences):  DNR/DNI. Continue medical management.  Psycho-social/Spiritual:   Desire for further Chaplaincy support:yes  Additional Recommendations: Caregiving  Support/Resources and Education on Hospice  Prognosis:   Unable to determine  Discharge Planning: To Be Determined      Primary Diagnoses: Present on Admission: . Essential hypertension . Hypothyroidism . Moderate dementia without behavioral disturbance . ICH (intracerebral hemorrhage) ([place]) . Cerebral vascular disease   I have reviewed the medical record, interviewed the patient and family, and examined the patient. The following aspects are pertinent.  Past Medical History:  Diagnosis Date  . Breast cancer ([place])    right  . Hypertension   . Personal history of chemotherapy   . Personal history of radiation therapy   . Renal cancer ([place])   . Thyroid disease    Social History   Socioeconomic History  . Marital status: Single    Spouse name: None  . Number of children: None  . Years of  education: None  . Highest education level: None  Social Needs  . Financial resource strain: None  . Food insecurity - worry: None  . Food insecurity - inability: None  . Transportation needs - medical: None  . Transportation needs - non-medical: None  Occupational History  . None  Tobacco Use  . Smoking status: Former Smoker    Types: Cigarettes    Last attempt to quit: 12/17/1973    Years since quitting: 43.3  . Smokeless tobacco: Never Used  Substance and Sexual Activity  . Alcohol use: No    Alcohol/week: 0.0 oz  . Drug use: No  . Sexual activity: No  Other Topics Concern  . None  Social History Narrative  . None   Family History  Problem Relation Age of Onset  . Cancer Sister        lung  . Cancer Sister        pancreas  . Cancer Sister        breast  . Breast cancer Sister    Scheduled Meds: . atorvastatin  20 mg Oral q1800  . chlorhexidine  15 mL Mouth  Rinse BID  . donepezil  10 mg Oral QHS  . hydrochlorothiazide  25 mg Oral Daily  . latanoprost  1 drop Both Eyes QHS  . letrozole  2.5 mg Oral Daily  . levothyroxine  75 mcg Oral QAC breakfast  . lisinopril  5 mg Oral Daily  . mouth rinse  15 mL Mouth Rinse q12n4p  . memantine  5 mg Oral BID  . metoprolol tartrate  25 mg Oral BID  . pantoprazole (PROTONIX) IV  40 mg Intravenous QHS   Continuous Infusions: . sodium chloride Stopped (03/29/17 0002)  . dexmedetomidine (PRECEDEX) IV infusion Stopped (03/31/17 0940)  . levETIRAcetam Stopped (03/31/17 1035)  . niCARDipine 2.5 mg/hr (03/31/17 1200)   PRN Meds:.acetaminophen **OR** acetaminophen (TYLENOL) oral liquid 160 mg/5 mL **OR** acetaminophen, hydrALAZINE, morphine injection Medications Prior to Admission:  Prior to Admission medications   Medication Sig Start Date End Date Taking? Authorizing Provider  aspirin 325 MG tablet Take 1 tablet (325 mg total) by mouth daily. 09/29/15  Yes Sudini, Alveta Heimlich, MD  atorvastatin (LIPITOR) 20 MG tablet Take 1 tablet (20  mg total) by mouth daily. 10/14/16  Yes Juline Patch, MD  donepezil (ARICEPT) 10 MG tablet Take 10 mg by mouth at bedtime. [place]   Yes [provider]  hydrochlorothiazide (HYDRODIURIL) 12.5 MG tablet Take 1 tablet (12.5 mg total) by mouth daily. 10/14/16  Yes Juline Patch, MD  latanoprost (XALATAN) 0.005 % ophthalmic solution Place 1 drop into both eyes at bedtime.  01/20/16  Yes [provider]  letrozole (FEMARA) 2.5 MG tablet Take 1 tablet (2.5 mg total) by mouth daily. 03/22/17  Yes Karen Kitchens, NP  levothyroxine (SYNTHROID, LEVOTHROID) 75 MCG tablet TAKE (1) TABLET BY MOUTH EVERY DAY 10/14/16  Yes Juline Patch, MD  lidocaine-prilocaine (EMLA) cream Apply 1 application topically as needed. 09/07/16  Yes Corcoran, Drue Second, MD  memantine (NAMENDA) 5 MG tablet Take 5 mg by mouth 2 (two) times daily. Dr Melrose Nakayama 08/31/15 03/28/17 Yes [provider]  potassium chloride (K-DUR) 10 MEQ tablet Take 1 tablet (10 mEq total) by mouth daily. 10/14/16  Yes Juline Patch, MD  vitamin B-12 (CYANOCOBALAMIN) 1000 MCG tablet Take 1,000 mcg by mouth daily.   Yes [provider]   Allergies  Allergen Reactions  . No Known Allergies    Review of Systems  Unable to perform ROS: Acuity of condition   Physical Exam  Constitutional: She is easily aroused. She appears ill.  Cardiovascular: Regular rhythm.  Pulmonary/Chest: Effort normal. No accessory muscle usage. No tachypnea. No respiratory distress.  Abdominal: There is no tenderness.  Neurological: She is easily aroused.  Opens eyes to voice. Follows some commands. Aphasia.  Skin: Skin is warm and dry.  Nursing note and vitals reviewed.  Vital Signs: BP 127/63   Pulse 63   Temp 98 F (36.7 C) (Oral)   Resp (!) 24   Ht 5' 4"  (1.626 m)   Wt 63.6 kg (140 lb 3.4 oz)   SpO2 93%   BMI 24.07 kg/m  Pain Assessment: PAINAD POSS *See Group Information*: 2-Acceptable,Slightly drowsy, easily aroused Pain Score:  Asleep  SpO2: SpO2: 93 % O2 Device:SpO2: 93 % O2 Flow Rate: .   IO: Intake/output summary:   Intake/Output Summary (Last 24 hours) at 03/31/2017 1243 Last data filed at 03/31/2017 1200 Gross per 24 hour  Intake 1188.86 ml  Output -  Net 1188.86 ml    LBM:   Baseline Weight: Weight:  63.6 kg (140 lb 3.4 oz) Most recent weight: Weight: 63.6 kg (140 lb 3.4 oz)     Palliative Assessment/Data: PPS 30%   Flowsheet Rows     Most Recent Value  Intake Tab  Referral Department  Critical care  Unit at Time of Referral  ICU  Palliative Care Primary Diagnosis  Neurology  Palliative Care Type  New Palliative care  Date first seen by Palliative Care  03/31/17  Clinical Assessment  Palliative Performance Scale Score  30%  Psychosocial & Spiritual Assessment  Palliative Care Outcomes  Patient/Family meeting held?  Yes  Who was at the meeting?  patient and son  Palliative Care Outcomes  Clarified goals of care, Provided psychosocial or spiritual support, ACP counseling assistance, Linked to palliative care logitudinal support      Time In: 1300 Time Out: 1425 Time Total: 24mn Greater than 50%  of this time was spent counseling and coordinating care related to the above assessment and plan.  Signed by:  MIhor Dow FNP-C Palliative Medicine Team  Phone: [phone]: [phone]  Please contact Palliative Medicine Team phone at [phone] questions and concerns.  For individual provider: See AShea Rosinski

## 2017-03-31 NOTE — Progress Notes (Signed)
OT Cancellation Note  Patient Details Name: Alexandra Glass MRN: [id number] DOB: January 26, 1925   Cancelled Treatment:    Reason Eval/Treat Not Completed: Patient at procedure or test/ unavailable(Pt. with Palliative Services. Will continue to monitor, and will assess pt. at a later time/date.)  Harrel Carina, MS, OTR/L 03/31/2017, 1:28 PM

## 2017-03-31 NOTE — Progress Notes (Signed)
Chaplain was making rounds and visited with pt in room ICU 2. Chaplain provided the ministry of prayer and a pastoral presence.    03/31/17 3837  Clinical Encounter Type  Visited With Patient  Visit Type Initial;Spiritual support  Referral From Nurse  Consult/Referral To Chaplain  Spiritual Encounters  Spiritual Needs Prayer

## 2017-03-31 NOTE — Evaluation (Addendum)
Physical Therapy Evaluation Patient Details Name: Alexandra Glass MRN: [id number] DOB: 1925/01/10 Today's Date: 03/31/2017   History of Present Illness  Pt is a 83 y/o F s/p fall with R frontal ICH with midline shift with findings consistent with amyloid angiopathy.  She was found on the floor unresponsive.  Carotid dopplers show no evidence of hemodynamically significant stenosis.  Neurosurgery stated that this was not intervenable, and recommended medical management in the ICU. Echocardiogram shows no cardiac source of emboli with an EF of 55%. MRI brain on 12/13 showed slight interval increase in the size of the L F IPH, though the basal cisterns and other pertinent spinal fluid spaces show patency.  Received PT order and verbal confirmation from Dr. Doy Mince that pt may be seen by PT on 12/14 with no restrictions.Ascending aorta atherosclerosis noted.  Pt's PMH includes dementia, R breast cancer s/p mastectomy, renal cancer.    Clinical Impression  Pt admitted with above diagnosis. Pt currently with functional limitations due to the deficits listed below (see PT Problem List).  Pt very lethargic and keeps eyes closed throughout 95% of session, responding "no" to all questions asked.  Pt able to report her first name but provides no other information.  No family present to provide information on pt's PLOF, home layout, or amount of assist available at d/c.  Per chart review the pt with moderate dementia at baseline but independent with most ADLs and ambulation. When eyes open the pt is able to follow gestural cues for simple AROM exercise with repetition.  When eyes closed the pt assists with exercises once initiated by this PT. VSS stable throughout session, see general comments below. Pt unable to participate in bed mobility or OOB mobility due to lethargy. Given pt's current mobility status, recommending SNF at d/c.  Pt will benefit from skilled PT to increase their independence and safety with mobility  to allow discharge to the venue listed below.      Follow Up Recommendations SNF;Supervision/Assistance - 24 hour    Equipment Recommendations  Other (comment)(TBD as pt progresses)    Recommendations for Other Services       Precautions / Restrictions Precautions Precautions: Fall;Other (comment) Precaution Comments: Monitor BP; port L chest Restrictions Weight Bearing Restrictions: No      Mobility  Bed Mobility               General bed mobility comments: Pt too lethargic to participate in bed mobility  Transfers                    Ambulation/Gait                Stairs            Wheelchair Mobility    Modified Rankin (Stroke Patients Only) Modified Rankin (Stroke Patients Only) Pre-Morbid Rankin Score: Moderate disability Modified Rankin: Severe disability     Balance Overall balance assessment: (Unable to assess)                                           Pertinent Vitals/Pain Pain Assessment: Faces Faces Pain Scale: No hurt Pain Location: No signs of pain Pain Intervention(s): Monitored during session    Home Living Family/patient expects to be discharged to:: Skilled nursing facility Living Arrangements: Alone  Additional Comments: Per chart review the pt lives alone at home, pt unable to provide additional information.     Prior Function Level of Independence: Independent         Comments: Per notes the pt with moderate dementia at baseline but lives alone and is able to complete most of ADLs independently.  It sounds like the pt required some assist with meal prep.  Pt not able to provide additional information and no family present during PT Evaluation.      Hand Dominance        Extremity/Trunk Assessment   Upper Extremity Assessment Upper Extremity Assessment: RUE deficits/detail;LUE deficits/detail RUE Deficits / Details: Unable to formally test strength but functionall  pt's BUE strength grossly at least 3/5 when providing assist with PT exercises. LUE Deficits / Details: Unable to formally test strength but functionall pt's BUE strength grossly at least 3/5 when providing assist with PT exercises.    Lower Extremity Assessment Lower Extremity Assessment: RLE deficits/detail;LLE deficits/detail RLE Deficits / Details: Unable to formally test strength but functionall pt's BLE strength grossly at least 3/5 when providing assist with PT exercises. LLE Deficits / Details: Unable to formally test strength but functionall pt's BLE strength grossly at least 3/5 when providing assist with PT exercises.       Communication   Communication: Other (comment);Receptive difficulties;Expressive difficulties(pt minimally verbal)  Cognition Arousal/Alertness: Lethargic Behavior During Therapy: Flat affect Overall Cognitive Status: No family/caregiver present to determine baseline cognitive functioning                                 General Comments: Per chart review, pt with moderate dementia at baseline.  Pt extremely lethargic this session with her eyes closed 95% of the session.  When eyes open the pt is able to follow gestural cues for simple AROM exercise.  When eyes closed the pt assists with exercises once initiated by this PT.  Pt responds with "no" to every question asked.  Pt able to tell this therapist her first name.       General Comments General comments (skin integrity, edema, etc.): BP at start of session in supine: 127/97 (102), following BUE exercises 141/73 (94), following BLE exercises 118/67 (82).  HR and SpO2 remain stable throughout session.     Exercises General Exercises - Upper Extremity Elbow Extension: AAROM;Both;10 reps;Supine Digit Composite Flexion: AROM;AAROM;Both;10 reps;Supine Composite Extension: AROM;AAROM;Both;10 reps;Supine General Exercises - Lower Extremity Straight Leg Raises: AAROM;Both;10 reps;Supine Low  Level/ICU Exercises Ankle Circles/Pumps: PROM;Both;10 reps;Supine Hip ABduction/ADduction: AAROM;Both;10 reps;Supine Heel Slides: AAROM;Both;10 reps;Supine Shoulder Flexion: AAROM;Right;10 reps;Supine Elbow Flexion: AAROM;Both;10 reps;Supine Other Exercises Other Exercises: Bil hip IR/ER AAROM x10 each direction each LE   Assessment/Plan    PT Assessment Patient needs continued PT services  PT Problem List Decreased strength;Decreased activity tolerance;Decreased balance;Decreased mobility;Decreased cognition;Decreased knowledge of use of DME;Decreased safety awareness;Decreased knowledge of precautions       PT Treatment Interventions DME instruction;Gait training;Stair training;Functional mobility training;Therapeutic activities;Therapeutic exercise;Balance training;Neuromuscular re-education;Cognitive remediation;Patient/family education;Wheelchair mobility training    PT Goals (Current goals can be found in the Care Plan section)  Acute Rehab PT Goals Patient Stated Goal: unable to state PT Goal Formulation: Patient unable to participate in goal setting Time For Goal Achievement: 04/14/17 Potential to Achieve Goals: Fair    Frequency 7X/week   Barriers to discharge Other (comment) Unsure of amount of assist available at d/c    Co-evaluation  AM-PAC PT "6 Clicks" Daily Activity  Outcome Measure Difficulty turning over in bed (including adjusting bedclothes, sheets and blankets)?: Unable Difficulty moving from lying on back to sitting on the side of the bed? : Unable Difficulty sitting down on and standing up from a chair with arms (e.g., wheelchair, bedside commode, etc,.)?: Unable Help needed moving to and from a bed to chair (including a wheelchair)?: Total Help needed walking in hospital room?: Total Help needed climbing 3-5 steps with a railing? : Total 6 Click Score: 6    End of Session   Activity Tolerance: Patient limited by lethargy Patient  left: in bed;with call bell/phone within reach;with bed alarm set Nurse Communication: Mobility status;Other (comment)(vital signs; pt's lethargy) PT Visit Diagnosis: Muscle weakness (generalized) (M62.81);History of falling (Z91.81);Unsteadiness on feet (R26.81);Difficulty in walking, not elsewhere classified (R26.2)    Time: 6153-7943 PT Time Calculation (min) (ACUTE ONLY): 19 min   Charges:   PT Evaluation $PT Eval High Complexity: 1 High     PT G Codes:   PT G-Codes **NOT FOR INPATIENT CLASS** Functional Assessment Tool Used: AM-PAC 6 Clicks Basic Mobility;Clinical judgement Functional Limitation: Mobility: Walking and moving around Mobility: Walking and Moving Around Current Status (E7614): 100 percent impaired, limited or restricted Mobility: Walking and Moving Around Goal Status (J0929): At least 60 percent but less than 80 percent impaired, limited or restricted    Collie Siad PT, DPT 03/31/2017, 11:40 AM

## 2017-03-31 NOTE — Progress Notes (Signed)
Pharmacy Electrolyte Monitoring Consult:  Pharmacy consulted to assist in monitoring and replacing electrolytes in this 83 y.o. female admitted on 03/28/2017 with Fall   Labs:  Sodium (mmol/L)  Date Value  03/31/2017 138  10/14/2016 147 (H)  08/06/2014 137   Potassium (mmol/L)  Date Value  03/31/2017 3.0 (L)  08/06/2014 3.3 (L)   Magnesium (mg/dL)  Date Value  03/30/2017 2.1   Phosphorus (mg/dL)  Date Value  03/31/2017 2.1 (L)   Calcium (mg/dL)  Date Value  03/31/2017 8.6 (L)   Calcium, Total (mg/dL)  Date Value  08/06/2014 9.3   Albumin (g/dL)  Date Value  03/28/2017 3.7  10/14/2016 4.4  08/06/2014 3.7    Plan: Patient ordered potassium 2mEq PO x 1 - patient unable to take. Will order potassium 55mEq IV x 1 and potassium phosphate 62mmol IV x 1.   Will recheck electrolytes with am labs.   Pharmacy will continue to monitor and adjust per consult.    Simpson,Michael L 03/31/2017 4:43 PM

## 2017-03-31 NOTE — Progress Notes (Addendum)
  Speech Language Pathology Treatment: Dysphagia  Patient Details Name: Alexandra Glass MRN: [id number] DOB: 10-06-1924 Today's Date: 03/31/2017 Time: 0910-0950 SLP Time Calculation (min) (ACUTE ONLY): 40 min  Assessment / Plan / Recommendation Clinical Impression  Pt continues to present w/ declined Cognitive status requiring medications at times d/t agitation and possible "sun-downing". 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  The IPH will likely evolve with time, and will take several weeks to months to resolve per Neurosurgery. With her medical status and age, recommend a Cognitive evaluation once she is able to discharge to a SNF - a more stable environment w/ routine in which Cognitive-Linguistic skills for ADLs can be assessed. Pt is verbally responsive to general questions re: self, confusion noted in responses and topic at times; speech is clear.  Pt consumed few po trials of Nectar liquids via pinched straw and tsp of puree w/ no overt s/s of aspiration noted; no decline in respiratory status or O2 sats. Oral phase c/b holding and slow A-P transfer - decreased awareness overall for the task of po intake/swallowing d/t drowsiness and Cognition. Recommend pt only take po's when off the sedating medication and fully awake to reduce risk for aspiration, dysphagia. Pt requires feeding assistance and verbal cues, monitoring at meals.  ST services will f/u w/ pt's medical and Cognitive status' while admitted for potential upgrade in diet consistency when appropriate, safe for pt. Recommend continue w/ a Dysphagia level 1(puree) w/ Nectar liquids; aspiration precautions; Pills in Puree - crushed as needed. NSG updated.     HPI  Pt is a 83 yo female who presented with a L frontal intraparenchymal hemorrhage with findings consistent with amyloid angiopathy. A family member went by the house to see her and found her down and unresponsive.  Patient's family found her at her home  and states that they feel that she fell sometime in the morning, as it appeared that she had slept in her bed the night before. Pt has a h/o breast and renal Ca w/ tx; HTN, thyroid dis.           SLP Plan  Continue with current plan of care       Recommendations  Diet recommendations: Dysphagia 1 (puree);Nectar-thick liquid Liquids provided via: Cup;Straw(monitor) Medication Administration: Crushed with puree Supervision: Staff to assist with self feeding;Full supervision/cueing for compensatory strategies Compensations: Minimize environmental distractions;Slow rate;Small sips/bites;Lingual sweep for clearance of pocketing;Multiple dry swallows after each bite/sip;Follow solids with liquid Postural Changes and/or Swallow Maneuvers: Seated upright 90 degrees;Upright 30-60 min after meal                General recommendations: (Dietician f/u) Oral Care Recommendations: Oral care BID;Staff/trained caregiver to provide oral care Follow up Recommendations: Skilled Nursing facility SLP Visit Diagnosis: Dysphagia, oropharyngeal phase (R13.12) Plan: Continue with current plan of care       Alexandra Glass, CCC-SLP Angline Schweigert 03/31/2017, 10:06 AM

## 2017-03-31 NOTE — Progress Notes (Signed)
[facility] [place] NAME: Alexandra Glass    MR#:  [id number]  DATE OF BIRTH:  [birth year]  Patient seen today, family is talking to palliative care.  Agitated overnight and started on Precedex drip.  CHIEF COMPLAINT:   Chief Complaint  Patient presents with  . Fall    REVIEW OF SYSTEMS:    Review of Systems  Unable to perform ROS: Mental acuity  Patient unable to give review of systems because she is having episodes of altered mental status.  Nutrition:  Tolerating Diet: Tolerating PT:      DRUG ALLERGIES:   Allergies  Allergen Reactions  . No Known Allergies     VITALS:  Blood pressure 132/71, pulse 73, temperature 98 F (36.7 C), temperature source Oral, resp. rate 15, height 5\' 4"  (1.626 m), weight 63.6 kg (140 lb 3.4 oz), SpO2 91 %.  PHYSICAL EXAMINATION:   Physical Exam  GENERAL:  83 y.o.-year-old patient lying in the bed with no acute distress.  Patient has vitiligo. EYES: Pupils equal, round, reactive to light and accommodation. No scleral icterus. Extraocular muscles intact.  HEENT: Head atraumatic, normocephalic. Oropharynx and nasopharynx clear.  NECK:  Supple, no jugular venous distention. No thyroid enlargement, no tenderness.  LUNGS: Normal breath sounds bilaterally, no wheezing, rales,rhonchi or crepitation. No use of accessory muscles of respiration.  CARDIOVASCULAR: S1, S2 normal. No murmurs, rubs, or gallops.  ABDOMEN: Soft, nontender, nondistended. Bowel sounds present. No organomegaly or mass.  EXTREMITIES: N  Psych;unable to follow commands because of intracranial hemorrhage,  LABORATORY PANEL:   CBC Recent Labs  Lab 03/28/17 2200  WBC 8.7  HGB 11.8*  HCT 36.4  PLT 176   ------------------------------------------------------------------------------------------------------------------  Chemistries  Recent Labs  Lab 03/28/17 2200  03/30/17 0539 03/31/17 0351  NA 140   < > 137 138  K  3.3*   < > 3.0* 3.0*  CL 109   < > 108 110  CO2 23   < > 20* 21*  GLUCOSE 148*   < > 128* 147*  BUN 23*   < > 18 28*  CREATININE 0.82   < > 0.60 0.58  CALCIUM 8.8*   < > 8.7* 8.6*  MG  --    < > 2.1  --   AST 26  --   --   --   ALT 13*  --   --   --   ALKPHOS 49  --   --   --   BILITOT 0.7  --   --   --    < > = values in this interval not displayed.   ------------------------------------------------------------------------------------------------------------------  Cardiac Enzymes No results for input(s): TROPONINI in the last 168 hours. ------------------------------------------------------------------------------------------------------------------  RADIOLOGY:  Ct Chest Wo Contrast  Result Date: 03/30/2017 CLINICAL DATA:  Followup lung nodule. EXAM: CT CHEST WITHOUT CONTRAST TECHNIQUE: Multidetector CT imaging of the chest was performed following the standard protocol without IV contrast. COMPARISON:  06/08/2016 FINDINGS: Cardiovascular: Moderate cardiac enlargement. There is aortic atherosclerosis noted. Calcifications within the LAD and left circumflex and RCA coronary artery is noted. No pericardial effusion identified. Stable appearance of the thoracic aorta. The descending thoracic aorta is increased in caliber measuring 4.1 cm. Mediastinum/Nodes: The trachea appears patent and is midline. Normal appearance of the esophagus. 9 mm right paratracheal lymph node is unchanged from previous exam. Within the limitations of unenhanced technique there is no hilar adenopathy. No enlarged supraclavicular or axillary nodes  identified. Lungs/Pleura: Moderate changes of emphysema. Masslike architectural distortion within the medial aspect of the right upper lobe measures 2.2 by 1.3 by 4.3 cm, image 58 of series 5 and image 37 of series 2. On the previous exam this measured 2.3 x 1.6 by 4.3 cm. Index spiculated nodule within the left upper lobe measures 6 x 5 mm, image 40 of series 3. Unchanged  from previous exam. Ground-glass attenuating nodule within the anterolateral left apex is stable measuring 7 mm, image 22 of series 3. Upper Abdomen: Unchanged left lobe of liver cyst measuring 1.9 cm, image 113 of series 2. No acute abnormality noted within the upper abdomen. Unchanged partially calcified lesion arising from the upper pole of left kidney. Incompletely characterized without IV contrast. Musculoskeletal: No aggressive lytic or sclerotic bone lesions identified. IMPRESSION: 1. Stable appearance of bilateral pulmonary lesions including masslike architectural distortion within the medial right upper lobe, ground-glass attenuating nodule in the left apex and spiculated nodule in the left upper lobe. 2. Aortic Atherosclerosis (ICD10-I70.0) and Emphysema (ICD10-J43.9). 3. Stable aneurysmal dilatation of the descending thoracic aorta. Electronically Signed   By: Kerby Moors M.D.   On: 03/30/2017 15:29   Mr Jodene Nam Head Wo Contrast  Result Date: 03/30/2017 CLINICAL DATA:  Patient found unresponsive after a fall. Past history of breast and lung cancer. EXAM: MRI HEAD WITHOUT CONTRAST MRA HEAD WITHOUT CONTRAST MRA NECK WITHOUT CONTRAST TECHNIQUE: Multiplanar, multiecho pulse sequences of the brain and surrounding structures were obtained without intravenous contrast. Angiographic images of the Circle of Willis were obtained using MRA technique without intravenous contrast. Angiographic images of the neck were obtained using MRA technique without intravenous contrast. Carotid stenosis measurements (when applicable) are obtained utilizing NASCET criteria, using the distal internal carotid diameter as the denominator. COMPARISON:  CT head 03/28/2017.  MR head 09/28/2015. FINDINGS: MRI HEAD FINDINGS Brain: Large intraparenchymal hemorrhage LEFT frontal lobe, with partial retraction of the clot. Predominant signal characteristic is T2 shortening representing Deoxyhemoglobin, although there is some T1 shortening  consistent with conversion to methemoglobin. Cross-sectional measurements of 18 x 45 x 49 mm, corresponding to a volume of approximately 20 mL. LEFT-to-RIGHT anterior frontal lobe shift under the falx. LEFT-to-RIGHT shift at the level of the septum pellucidum is approximately 4 mm. Mild surrounding edema. Artifactual bright signal on diffusion-weighted imaging, due to blood products. Extreme atrophy. Chronic microvascular ischemic change of an advanced nature. 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 Chronic bland infarcts of the LEFT frontal and temporal lobes, which were acute in 2017. No contrast was given, although the internal architecture of the bleed does not suggest an underlying mass lesion. Vascular: Reported separately. Skull and upper cervical spine: Limited visualization. No marrow destructive process. Sinuses/Orbits: Negative. Other: None. MRA HEAD FINDINGS Motion degraded exam. There is patency of the cervical internal carotid arteries throughout their upper cervical and petrous segments. Signal loss in the BILATERAL cavernous ICA segments appears to be related to motion degradation rather than discrete lesion. BILATERAL ICA termini widely patent. There is no RIGHT or LEFT flow-limiting stenosis of the M1 MCA segments. BILATERAL anterior cerebral arteries are patent, with LEFT-to-RIGHT shift of the A3 segments. Artifactual signal loss of the BILATERAL MCA bifurcations. Basilar artery is patent with mild irregularity proximally. The RIGHT vertebral is the dominant contributor to the basilar, with irregularity in the V4 segment. The LEFT vertebral is non dominant, with a severe stenosis at its junction with the basilar. Mild irregularity of both PCA segments, non flow  reducing. BILATERAL superior cerebellar arteries are unremarkable. Poorly visualized anterior inferior and posterior inferior cerebellar arteries. No enlarged anterior circulation vessels to suggest  arteriovenous malformation. No evidence for saccular aneurysm, although BILATERAL MCA bifurcations are poorly visualized due to artifactual signal loss. MRA NECK FINDINGS Noncontrast examination was performed. This examination is targeted to the carotid bifurcations and does not include the arch. The visualized portions of the common carotid arteries are widely patent. There is no flow-limiting stenosis, or dissection involving the carotid bifurcations. Proximal internal carotid artery segments are widely patent. There is no dissection, fibromuscular change, or pseudoaneurysm involving the cervical ICA segments. Both vertebral arteries are widely patent throughout their neck to the skullbase, with the RIGHT dominant. IMPRESSION: Large intraparenchymal hemorrhage in the LEFT frontal lobe is redemonstrated, corresponding to a late acute/ early subacute time course. No specific etiology is identified on this noncontrast exam, but considerations include a lobar hypertensive bleed, hemorrhage due to cerebral amyloid angiopathy, or a posttraumatic intracerebral hemorrhage. Embolic infarct with reperfusion, arteriovenous malformation, or hemorrhagic metastasis, are less likely. The lesion has decompressed somewhat into the ventricular system, with cross-sectional measurements of the hematoma cavity corresponding to approximate 20 mL volume. 4 mm of LEFT-to-RIGHT shift. Extreme atrophy and small vessel disease. Motion degraded intracranial MRA demonstrates no flow-limiting stenosis or enlarged feeding vessels. Extracranial MRA demonstrates no flow-limiting stenosis in the anterior circulation. There is a severe stenosis of the non dominant distal vertebral artery at its junction with the basilar. Electronically Signed   By: Staci Righter M.D.   On: 03/30/2017 14:14   Mr Jodene Nam Neck Wo Contrast  Result Date: 03/30/2017 CLINICAL DATA:  Patient found unresponsive after a fall. Past history of breast and lung cancer. EXAM:  MRI HEAD WITHOUT CONTRAST MRA HEAD WITHOUT CONTRAST MRA NECK WITHOUT CONTRAST TECHNIQUE: Multiplanar, multiecho pulse sequences of the brain and surrounding structures were obtained without intravenous contrast. Angiographic images of the Circle of Willis were obtained using MRA technique without intravenous contrast. Angiographic images of the neck were obtained using MRA technique without intravenous contrast. Carotid stenosis measurements (when applicable) are obtained utilizing NASCET criteria, using the distal internal carotid diameter as the denominator. COMPARISON:  CT head 03/28/2017.  MR head 09/28/2015. FINDINGS: MRI HEAD FINDINGS Brain: Large intraparenchymal hemorrhage LEFT frontal lobe, with partial retraction of the clot. Predominant signal characteristic is T2 shortening representing Deoxyhemoglobin, although there is some T1 shortening consistent with conversion to methemoglobin. Cross-sectional measurements of 18 x 45 x 49 mm, corresponding to a volume of approximately 20 mL. LEFT-to-RIGHT anterior frontal lobe shift under the falx. LEFT-to-RIGHT shift at the level of the septum pellucidum is approximately 4 mm. Mild surrounding edema. Artifactual bright signal on diffusion-weighted imaging, due to blood products. Extreme atrophy. Chronic microvascular ischemic change of an advanced nature. 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 Chronic bland infarcts of the LEFT frontal and temporal lobes, which were acute in 2017. No contrast was given, although the internal architecture of the bleed does not suggest an underlying mass lesion. Vascular: Reported separately. Skull and upper cervical spine: Limited visualization. No marrow destructive process. Sinuses/Orbits: Negative. Other: None. MRA HEAD FINDINGS Motion degraded exam. There is patency of the cervical internal carotid arteries throughout their upper cervical and petrous segments. Signal loss in the BILATERAL  cavernous ICA segments appears to be related to motion degradation rather than discrete lesion. BILATERAL ICA termini widely patent. There is no RIGHT or LEFT flow-limiting stenosis of the M1 MCA segments. BILATERAL  anterior cerebral arteries are patent, with LEFT-to-RIGHT shift of the A3 segments. Artifactual signal loss of the BILATERAL MCA bifurcations. Basilar artery is patent with mild irregularity proximally. The RIGHT vertebral is the dominant contributor to the basilar, with irregularity in the V4 segment. The LEFT vertebral is non dominant, with a severe stenosis at its junction with the basilar. Mild irregularity of both PCA segments, non flow reducing. BILATERAL superior cerebellar arteries are unremarkable. Poorly visualized anterior inferior and posterior inferior cerebellar arteries. No enlarged anterior circulation vessels to suggest arteriovenous malformation. No evidence for saccular aneurysm, although BILATERAL MCA bifurcations are poorly visualized due to artifactual signal loss. MRA NECK FINDINGS Noncontrast examination was performed. This examination is targeted to the carotid bifurcations and does not include the arch. The visualized portions of the common carotid arteries are widely patent. There is no flow-limiting stenosis, or dissection involving the carotid bifurcations. Proximal internal carotid artery segments are widely patent. There is no dissection, fibromuscular change, or pseudoaneurysm involving the cervical ICA segments. Both vertebral arteries are widely patent throughout their neck to the skullbase, with the RIGHT dominant. IMPRESSION: Large intraparenchymal hemorrhage in the LEFT frontal lobe is redemonstrated, corresponding to a late acute/ early subacute time course. No specific etiology is identified on this noncontrast exam, but considerations include a lobar hypertensive bleed, hemorrhage due to cerebral amyloid angiopathy, or a posttraumatic intracerebral hemorrhage.  Embolic infarct with reperfusion, arteriovenous malformation, or hemorrhagic metastasis, are less likely. The lesion has decompressed somewhat into the ventricular system, with cross-sectional measurements of the hematoma cavity corresponding to approximate 20 mL volume. 4 mm of LEFT-to-RIGHT shift. Extreme atrophy and small vessel disease. Motion degraded intracranial MRA demonstrates no flow-limiting stenosis or enlarged feeding vessels. Extracranial MRA demonstrates no flow-limiting stenosis in the anterior circulation. There is a severe stenosis of the non dominant distal vertebral artery at its junction with the basilar. Electronically Signed   By: Staci Righter M.D.   On: 03/30/2017 14:14   Mr Brain Wo Contrast  Result Date: 03/30/2017 CLINICAL DATA:  Patient found unresponsive after a fall. Past history of breast and lung cancer. EXAM: MRI HEAD WITHOUT CONTRAST MRA HEAD WITHOUT CONTRAST MRA NECK WITHOUT CONTRAST TECHNIQUE: Multiplanar, multiecho pulse sequences of the brain and surrounding structures were obtained without intravenous contrast. Angiographic images of the Circle of Willis were obtained using MRA technique without intravenous contrast. Angiographic images of the neck were obtained using MRA technique without intravenous contrast. Carotid stenosis measurements (when applicable) are obtained utilizing NASCET criteria, using the distal internal carotid diameter as the denominator. COMPARISON:  CT head 03/28/2017.  MR head 09/28/2015. FINDINGS: MRI HEAD FINDINGS Brain: Large intraparenchymal hemorrhage LEFT frontal lobe, with partial retraction of the clot. Predominant signal characteristic is T2 shortening representing Deoxyhemoglobin, although there is some T1 shortening consistent with conversion to methemoglobin. Cross-sectional measurements of 18 x 45 x 49 mm, corresponding to a volume of approximately 20 mL. LEFT-to-RIGHT anterior frontal lobe shift under the falx. LEFT-to-RIGHT shift at  the level of the septum pellucidum is approximately 4 mm. Mild surrounding edema. Artifactual bright signal on diffusion-weighted imaging, due to blood products. Extreme atrophy. Chronic microvascular ischemic change of an advanced nature. 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 Chronic bland infarcts of the LEFT frontal and temporal lobes, which were acute in 2017. No contrast was given, although the internal architecture of the bleed does not suggest an underlying mass lesion. Vascular: Reported separately. Skull and upper cervical spine: Limited visualization. No marrow  destructive process. Sinuses/Orbits: Negative. Other: None. MRA HEAD FINDINGS Motion degraded exam. There is patency of the cervical internal carotid arteries throughout their upper cervical and petrous segments. Signal loss in the BILATERAL cavernous ICA segments appears to be related to motion degradation rather than discrete lesion. BILATERAL ICA termini widely patent. There is no RIGHT or LEFT flow-limiting stenosis of the M1 MCA segments. BILATERAL anterior cerebral arteries are patent, with LEFT-to-RIGHT shift of the A3 segments. Artifactual signal loss of the BILATERAL MCA bifurcations. Basilar artery is patent with mild irregularity proximally. The RIGHT vertebral is the dominant contributor to the basilar, with irregularity in the V4 segment. The LEFT vertebral is non dominant, with a severe stenosis at its junction with the basilar. Mild irregularity of both PCA segments, non flow reducing. BILATERAL superior cerebellar arteries are unremarkable. Poorly visualized anterior inferior and posterior inferior cerebellar arteries. No enlarged anterior circulation vessels to suggest arteriovenous malformation. No evidence for saccular aneurysm, although BILATERAL MCA bifurcations are poorly visualized due to artifactual signal loss. MRA NECK FINDINGS Noncontrast examination was performed. This examination is  targeted to the carotid bifurcations and does not include the arch. The visualized portions of the common carotid arteries are widely patent. There is no flow-limiting stenosis, or dissection involving the carotid bifurcations. Proximal internal carotid artery segments are widely patent. There is no dissection, fibromuscular change, or pseudoaneurysm involving the cervical ICA segments. Both vertebral arteries are widely patent throughout their neck to the skullbase, with the RIGHT dominant. IMPRESSION: Large intraparenchymal hemorrhage in the LEFT frontal lobe is redemonstrated, corresponding to a late acute/ early subacute time course. No specific etiology is identified on this noncontrast exam, but considerations include a lobar hypertensive bleed, hemorrhage due to cerebral amyloid angiopathy, or a posttraumatic intracerebral hemorrhage. Embolic infarct with reperfusion, arteriovenous malformation, or hemorrhagic metastasis, are less likely. The lesion has decompressed somewhat into the ventricular system, with cross-sectional measurements of the hematoma cavity corresponding to approximate 20 mL volume. 4 mm of LEFT-to-RIGHT shift. Extreme atrophy and small vessel disease. Motion degraded intracranial MRA demonstrates no flow-limiting stenosis or enlarged feeding vessels. Extracranial MRA demonstrates no flow-limiting stenosis in the anterior circulation. There is a severe stenosis of the non dominant distal vertebral artery at its junction with the basilar. Electronically Signed   By: Staci Righter M.D.   On: 03/30/2017 14:14   Dg Chest Port 1 View  Result Date: 03/30/2017 CLINICAL DATA:  History of breast and renal carcinoma. Intracerebral hemorrhage. EXAM: PORTABLE CHEST 1 VIEW COMPARISON:  Chest radiograph July 07, 2014 and chest CT February 29, 2018 FINDINGS: Port-A-Cath tip is at the cavoatrial junction. No pneumothorax. There is no appreciable edema or consolidation. Heart is mildly enlarged with  pulmonary vascularity within normal limits. Aorta is tortuous with aortic atherosclerosis. No evident adenopathy. Bones appear osteoporotic. No focal bone lesions are evident. IMPRESSION: Port-A-Cath as described without pneumothorax. Cardiomegaly with pulmonary vascularity within normal limits. No edema or consolidation. Tortuous aorta with aortic atherosclerosis noted. Aortic Atherosclerosis (ICD10-I70.0). Electronically Signed   By: Lowella Grip III M.D.   On: 03/30/2017 08:35     ASSESSMENT AND PLAN:   Principal Problem:   ICH (intracerebral hemorrhage) (HCC) Active Problems:   Moderate dementia without behavioral disturbance   Essential hypertension   Cerebral vascular disease   Hypothyroidism   #1.large intracranial hemorrhage in the left frontal area and MRI of the brain confirmed .  Patient has mild midline shift from left to right.  Patient agitated overnight and  started on Precedex drip.on Keppra.  Seen by neurology, neurosurgery.  Palliative care consult in progress.  Follow EEG results.  2.  Large intracranial hemorrhage: Dr. Cari Caraway and Dr. Doy Mince help is appreciated. 3.  CODE STATUS DNR.      All the records are reviewed and case discussed with Care Management/Social Workerr. Management plans discussed with the patient, family and they are in agreement.  CODE STATUS:DNR  TOTAL TIME TAKING CARE OF THIS PATIENT: 35 minutes.   POSSIBLE D/C IN 3-4DAYS, DEPENDING ON CLINICAL CONDITION.   Epifanio Lesches M.D on 03/31/2017 at 2:28 PM  Between 7am to 6pm - Pager - [phone]  After 6pm go to [email or web address] - password [email or web address] [facility]  Office  [phone]  CC: Primary care physician; Juline Patch, MD

## 2017-03-31 NOTE — Progress Notes (Signed)
Subjective: Patient agitated overnight.  Moving all extremities and trying to get out of the bed.  Able to make some short sentences.  Now on Precedex.    Objective: Current vital signs: BP 118/67   Pulse 67   Temp 97.6 F (36.4 C) (Axillary)   Resp (!) 23   Ht 5\' 4"  (1.626 m)   Wt 63.6 kg (140 lb 3.4 oz)   SpO2 95%   BMI 24.07 kg/m  Vital signs in last 24 hours: Temp:  [97.5 F (36.4 C)-97.7 F (36.5 C)] 97.6 F (36.4 C) (12/14 0800) Pulse Rate:  [50-76] 67 (12/14 1100) Resp:  [9-25] 23 (12/14 1100) BP: (109-168)/(62-146) 118/67 (12/14 1100) SpO2:  [93 %-97 %] 95 % (12/14 1100)  Intake/Output from previous day: 12/13 0701 - 12/14 0700 In: 1291.6 [I.V.:676.6; IV Piggyback:615] Out: -  Intake/Output this shift: Total I/O In: 196.8 [I.V.:91.8; IV Piggyback:105] Out: -  Nutritional status: DIET - DYS 1 Room service appropriate? Yes with Assist; Fluid consistency: Nectar Thick  Neurologic Exam: Mental Status: Patient does not respond to verbal stimuli.  Does not respond to deep sternal rub.  Does not follow commands.  No verbalizations noted.  Cranial Nerves: II: patient does not respond confrontation bilaterally, pupils right 2 mm, left 2 mm,and reactive bilaterally III,IV,VI: doll's response present bilaterally. Right gaze preference V,VII: corneal reflex present bilaterally  VIII: patient does not respond to verbal stimuli IX,X: gag reflex reduced, XI: trapezius strength unable to test bilaterally XII: tongue strength unable to test Motor: Extremities flaccid throughout.  No spontaneous movement noted.  No purposeful movements noted.   Lab Results: Basic Metabolic Panel: Recent Labs  Lab 03/28/17 2200 03/29/17 1337 03/29/17 2115 03/30/17 0539 03/31/17 0351  NA 140 140  --  137 138  K 3.3* 3.1* 3.7 3.0* 3.0*  CL 109 109  --  108 110  CO2 23 23  --  20* 21*  GLUCOSE 148* 128*  --  128* 147*  BUN 23* 19  --  18 28*  CREATININE 0.82 0.68  --  0.60 0.58   CALCIUM 8.8* 8.4*  --  8.7* 8.6*  MG  --  2.1  --  2.1  --   PHOS  --   --   --   --  2.1*    Liver Function Tests: Recent Labs  Lab 03/28/17 2200  AST 26  ALT 13*  ALKPHOS 49  BILITOT 0.7  PROT 6.8  ALBUMIN 3.7   No results for input(s): LIPASE, AMYLASE in the last 168 hours. No results for input(s): AMMONIA in the last 168 hours.  CBC: Recent Labs  Lab 03/28/17 2200  WBC 8.7  HGB 11.8*  HCT 36.4  MCV 78.4*  PLT 176    Cardiac Enzymes: Recent Labs  Lab 03/28/17 2200  CKTOTAL 882*    Lipid Panel: Recent Labs  Lab 03/31/17 0351  CHOL 167  TRIG 58  HDL 72  CHOLHDL 2.3  VLDL 12  LDLCALC 83    CBG: Recent Labs  Lab 03/30/17 1021 03/30/17 1204 03/30/17 1610 03/30/17 1630 03/31/17 0738  GLUCAP 143* 113* 133* 120* 39*    Microbiology: Results for orders placed or performed during the hospital encounter of 03/28/17  Urine culture     Status: None   Collection Time: 03/28/17  8:18 PM  Result Value Ref Range Status   Specimen Description URINE, CATHETERIZED  Final   Special Requests NONE  Final   Culture   Final  NO GROWTH Performed at [facility], [place] [address] [place], [place] [address]    Report Status 03/30/2017 FINAL  Final  MRSA PCR Screening     Status: None   Collection Time: 03/29/17  2:42 AM  Result Value Ref Range Status   MRSA by PCR NEGATIVE NEGATIVE Final    Comment:        The GeneXpert MRSA Assay (FDA approved for NASAL specimens only), is one component of a comprehensive MRSA colonization surveillance program. It is not intended to diagnose MRSA infection nor to guide or monitor treatment for MRSA infections.     Coagulation Studies: No results for input(s): LABPROT, INR in the last 72 hours.  Imaging: Ct Chest Wo Contrast  Result Date: 03/30/2017 CLINICAL DATA:  Followup lung nodule. EXAM: CT CHEST WITHOUT CONTRAST TECHNIQUE: Multidetector CT imaging of the chest was performed following the standard  protocol without IV contrast. COMPARISON:  06/08/2016 FINDINGS: Cardiovascular: Moderate cardiac enlargement. There is aortic atherosclerosis noted. Calcifications within the LAD and left circumflex and RCA coronary artery is noted. No pericardial effusion identified. Stable appearance of the thoracic aorta. The descending thoracic aorta is increased in caliber measuring 4.1 cm. Mediastinum/Nodes: The trachea appears patent and is midline. Normal appearance of the esophagus. 9 mm right paratracheal lymph node is unchanged from previous exam. Within the limitations of unenhanced technique there is no hilar adenopathy. No enlarged supraclavicular or axillary nodes identified. Lungs/Pleura: Moderate changes of emphysema. Masslike architectural distortion within the medial aspect of the right upper lobe measures 2.2 by 1.3 by 4.3 cm, image 58 of series 5 and image 37 of series 2. On the previous exam this measured 2.3 x 1.6 by 4.3 cm. Index spiculated nodule within the left upper lobe measures 6 x 5 mm, image 40 of series 3. Unchanged from previous exam. Ground-glass attenuating nodule within the anterolateral left apex is stable measuring 7 mm, image 22 of series 3. Upper Abdomen: Unchanged left lobe of liver cyst measuring 1.9 cm, image 113 of series 2. No acute abnormality noted within the upper abdomen. Unchanged partially calcified lesion arising from the upper pole of left kidney. Incompletely characterized without IV contrast. Musculoskeletal: No aggressive lytic or sclerotic bone lesions identified. IMPRESSION: 1. Stable appearance of bilateral pulmonary lesions including masslike architectural distortion within the medial right upper lobe, ground-glass attenuating nodule in the left apex and spiculated nodule in the left upper lobe. 2. Aortic Atherosclerosis (ICD10-I70.0) and Emphysema (ICD10-J43.9). 3. Stable aneurysmal dilatation of the descending thoracic aorta. Electronically Signed   By: Kerby Moors  M.D.   On: 03/30/2017 15:29   Mr Jodene Nam Head Wo Contrast  Result Date: 03/30/2017 CLINICAL DATA:  Patient found unresponsive after a fall. Past history of breast and lung cancer. EXAM: MRI HEAD WITHOUT CONTRAST MRA HEAD WITHOUT CONTRAST MRA NECK WITHOUT CONTRAST TECHNIQUE: Multiplanar, multiecho pulse sequences of the brain and surrounding structures were obtained without intravenous contrast. Angiographic images of the Circle of Willis were obtained using MRA technique without intravenous contrast. Angiographic images of the neck were obtained using MRA technique without intravenous contrast. Carotid stenosis measurements (when applicable) are obtained utilizing NASCET criteria, using the distal internal carotid diameter as the denominator. COMPARISON:  CT head 03/28/2017.  MR head 09/28/2015. FINDINGS: MRI HEAD FINDINGS Brain: Large intraparenchymal hemorrhage LEFT frontal lobe, with partial retraction of the clot. Predominant signal characteristic is T2 shortening representing Deoxyhemoglobin, although there is some T1 shortening consistent with conversion to methemoglobin. Cross-sectional measurements of  18 x 45 x 49 mm, corresponding to a volume of approximately 20 mL. LEFT-to-RIGHT anterior frontal lobe shift under the falx. LEFT-to-RIGHT shift at the level of the septum pellucidum is approximately 4 mm. Mild surrounding edema. Artifactual bright signal on diffusion-weighted imaging, due to blood products. Extreme atrophy. Chronic microvascular ischemic change of an advanced nature. 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 Chronic bland infarcts of the LEFT frontal and temporal lobes, which were acute in 2017. No contrast was given, although the internal architecture of the bleed does not suggest an underlying mass lesion. Vascular: Reported separately. Skull and upper cervical spine: Limited visualization. No marrow destructive process. Sinuses/Orbits: Negative. Other: None.  MRA HEAD FINDINGS Motion degraded exam. There is patency of the cervical internal carotid arteries throughout their upper cervical and petrous segments. Signal loss in the BILATERAL cavernous ICA segments appears to be related to motion degradation rather than discrete lesion. BILATERAL ICA termini widely patent. There is no RIGHT or LEFT flow-limiting stenosis of the M1 MCA segments. BILATERAL anterior cerebral arteries are patent, with LEFT-to-RIGHT shift of the A3 segments. Artifactual signal loss of the BILATERAL MCA bifurcations. Basilar artery is patent with mild irregularity proximally. The RIGHT vertebral is the dominant contributor to the basilar, with irregularity in the V4 segment. The LEFT vertebral is non dominant, with a severe stenosis at its junction with the basilar. Mild irregularity of both PCA segments, non flow reducing. BILATERAL superior cerebellar arteries are unremarkable. Poorly visualized anterior inferior and posterior inferior cerebellar arteries. No enlarged anterior circulation vessels to suggest arteriovenous malformation. No evidence for saccular aneurysm, although BILATERAL MCA bifurcations are poorly visualized due to artifactual signal loss. MRA NECK FINDINGS Noncontrast examination was performed. This examination is targeted to the carotid bifurcations and does not include the arch. The visualized portions of the common carotid arteries are widely patent. There is no flow-limiting stenosis, or dissection involving the carotid bifurcations. Proximal internal carotid artery segments are widely patent. There is no dissection, fibromuscular change, or pseudoaneurysm involving the cervical ICA segments. Both vertebral arteries are widely patent throughout their neck to the skullbase, with the RIGHT dominant. IMPRESSION: Large intraparenchymal hemorrhage in the LEFT frontal lobe is redemonstrated, corresponding to a late acute/ early subacute time course. No specific etiology is  identified on this noncontrast exam, but considerations include a lobar hypertensive bleed, hemorrhage due to cerebral amyloid angiopathy, or a posttraumatic intracerebral hemorrhage. Embolic infarct with reperfusion, arteriovenous malformation, or hemorrhagic metastasis, are less likely. The lesion has decompressed somewhat into the ventricular system, with cross-sectional measurements of the hematoma cavity corresponding to approximate 20 mL volume. 4 mm of LEFT-to-RIGHT shift. Extreme atrophy and small vessel disease. Motion degraded intracranial MRA demonstrates no flow-limiting stenosis or enlarged feeding vessels. Extracranial MRA demonstrates no flow-limiting stenosis in the anterior circulation. There is a severe stenosis of the non dominant distal vertebral artery at its junction with the basilar. Electronically Signed   By: Staci Righter M.D.   On: 03/30/2017 14:14   Mr Jodene Nam Neck Wo Contrast  Result Date: 03/30/2017 CLINICAL DATA:  Patient found unresponsive after a fall. Past history of breast and lung cancer. EXAM: MRI HEAD WITHOUT CONTRAST MRA HEAD WITHOUT CONTRAST MRA NECK WITHOUT CONTRAST TECHNIQUE: Multiplanar, multiecho pulse sequences of the brain and surrounding structures were obtained without intravenous contrast. Angiographic images of the Circle of Willis were obtained using MRA technique without intravenous contrast. Angiographic images of the neck were obtained using MRA technique without intravenous contrast.  Carotid stenosis measurements (when applicable) are obtained utilizing NASCET criteria, using the distal internal carotid diameter as the denominator. COMPARISON:  CT head 03/28/2017.  MR head 09/28/2015. FINDINGS: MRI HEAD FINDINGS Brain: Large intraparenchymal hemorrhage LEFT frontal lobe, with partial retraction of the clot. Predominant signal characteristic is T2 shortening representing Deoxyhemoglobin, although there is some T1 shortening consistent with conversion to  methemoglobin. Cross-sectional measurements of 18 x 45 x 49 mm, corresponding to a volume of approximately 20 mL. LEFT-to-RIGHT anterior frontal lobe shift under the falx. LEFT-to-RIGHT shift at the level of the septum pellucidum is approximately 4 mm. Mild surrounding edema. Artifactual bright signal on diffusion-weighted imaging, due to blood products. Extreme atrophy. Chronic microvascular ischemic change of an advanced nature. 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 Chronic bland infarcts of the LEFT frontal and temporal lobes, which were acute in 2017. No contrast was given, although the internal architecture of the bleed does not suggest an underlying mass lesion. Vascular: Reported separately. Skull and upper cervical spine: Limited visualization. No marrow destructive process. Sinuses/Orbits: Negative. Other: None. MRA HEAD FINDINGS Motion degraded exam. There is patency of the cervical internal carotid arteries throughout their upper cervical and petrous segments. Signal loss in the BILATERAL cavernous ICA segments appears to be related to motion degradation rather than discrete lesion. BILATERAL ICA termini widely patent. There is no RIGHT or LEFT flow-limiting stenosis of the M1 MCA segments. BILATERAL anterior cerebral arteries are patent, with LEFT-to-RIGHT shift of the A3 segments. Artifactual signal loss of the BILATERAL MCA bifurcations. Basilar artery is patent with mild irregularity proximally. The RIGHT vertebral is the dominant contributor to the basilar, with irregularity in the V4 segment. The LEFT vertebral is non dominant, with a severe stenosis at its junction with the basilar. Mild irregularity of both PCA segments, non flow reducing. BILATERAL superior cerebellar arteries are unremarkable. Poorly visualized anterior inferior and posterior inferior cerebellar arteries. No enlarged anterior circulation vessels to suggest arteriovenous malformation. No evidence  for saccular aneurysm, although BILATERAL MCA bifurcations are poorly visualized due to artifactual signal loss. MRA NECK FINDINGS Noncontrast examination was performed. This examination is targeted to the carotid bifurcations and does not include the arch. The visualized portions of the common carotid arteries are widely patent. There is no flow-limiting stenosis, or dissection involving the carotid bifurcations. Proximal internal carotid artery segments are widely patent. There is no dissection, fibromuscular change, or pseudoaneurysm involving the cervical ICA segments. Both vertebral arteries are widely patent throughout their neck to the skullbase, with the RIGHT dominant. IMPRESSION: Large intraparenchymal hemorrhage in the LEFT frontal lobe is redemonstrated, corresponding to a late acute/ early subacute time course. No specific etiology is identified on this noncontrast exam, but considerations include a lobar hypertensive bleed, hemorrhage due to cerebral amyloid angiopathy, or a posttraumatic intracerebral hemorrhage. Embolic infarct with reperfusion, arteriovenous malformation, or hemorrhagic metastasis, are less likely. The lesion has decompressed somewhat into the ventricular system, with cross-sectional measurements of the hematoma cavity corresponding to approximate 20 mL volume. 4 mm of LEFT-to-RIGHT shift. Extreme atrophy and small vessel disease. Motion degraded intracranial MRA demonstrates no flow-limiting stenosis or enlarged feeding vessels. Extracranial MRA demonstrates no flow-limiting stenosis in the anterior circulation. There is a severe stenosis of the non dominant distal vertebral artery at its junction with the basilar. Electronically Signed   By: Staci Righter M.D.   On: 03/30/2017 14:14   Mr Brain Wo Contrast  Result Date: 03/30/2017 CLINICAL DATA:  Patient found unresponsive after  a fall. Past history of breast and lung cancer. EXAM: MRI HEAD WITHOUT CONTRAST MRA HEAD WITHOUT  CONTRAST MRA NECK WITHOUT CONTRAST TECHNIQUE: Multiplanar, multiecho pulse sequences of the brain and surrounding structures were obtained without intravenous contrast. Angiographic images of the Circle of Willis were obtained using MRA technique without intravenous contrast. Angiographic images of the neck were obtained using MRA technique without intravenous contrast. Carotid stenosis measurements (when applicable) are obtained utilizing NASCET criteria, using the distal internal carotid diameter as the denominator. COMPARISON:  CT head 03/28/2017.  MR head 09/28/2015. FINDINGS: MRI HEAD FINDINGS Brain: Large intraparenchymal hemorrhage LEFT frontal lobe, with partial retraction of the clot. Predominant signal characteristic is T2 shortening representing Deoxyhemoglobin, although there is some T1 shortening consistent with conversion to methemoglobin. Cross-sectional measurements of 18 x 45 x 49 mm, corresponding to a volume of approximately 20 mL. LEFT-to-RIGHT anterior frontal lobe shift under the falx. LEFT-to-RIGHT shift at the level of the septum pellucidum is approximately 4 mm. Mild surrounding edema. Artifactual bright signal on diffusion-weighted imaging, due to blood products. Extreme atrophy. Chronic microvascular ischemic change of an advanced nature. 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 Chronic bland infarcts of the LEFT frontal and temporal lobes, which were acute in 2017. No contrast was given, although the internal architecture of the bleed does not suggest an underlying mass lesion. Vascular: Reported separately. Skull and upper cervical spine: Limited visualization. No marrow destructive process. Sinuses/Orbits: Negative. Other: None. MRA HEAD FINDINGS Motion degraded exam. There is patency of the cervical internal carotid arteries throughout their upper cervical and petrous segments. Signal loss in the BILATERAL cavernous ICA segments appears to be related to  motion degradation rather than discrete lesion. BILATERAL ICA termini widely patent. There is no RIGHT or LEFT flow-limiting stenosis of the M1 MCA segments. BILATERAL anterior cerebral arteries are patent, with LEFT-to-RIGHT shift of the A3 segments. Artifactual signal loss of the BILATERAL MCA bifurcations. Basilar artery is patent with mild irregularity proximally. The RIGHT vertebral is the dominant contributor to the basilar, with irregularity in the V4 segment. The LEFT vertebral is non dominant, with a severe stenosis at its junction with the basilar. Mild irregularity of both PCA segments, non flow reducing. BILATERAL superior cerebellar arteries are unremarkable. Poorly visualized anterior inferior and posterior inferior cerebellar arteries. No enlarged anterior circulation vessels to suggest arteriovenous malformation. No evidence for saccular aneurysm, although BILATERAL MCA bifurcations are poorly visualized due to artifactual signal loss. MRA NECK FINDINGS Noncontrast examination was performed. This examination is targeted to the carotid bifurcations and does not include the arch. The visualized portions of the common carotid arteries are widely patent. There is no flow-limiting stenosis, or dissection involving the carotid bifurcations. Proximal internal carotid artery segments are widely patent. There is no dissection, fibromuscular change, or pseudoaneurysm involving the cervical ICA segments. Both vertebral arteries are widely patent throughout their neck to the skullbase, with the RIGHT dominant. IMPRESSION: Large intraparenchymal hemorrhage in the LEFT frontal lobe is redemonstrated, corresponding to a late acute/ early subacute time course. No specific etiology is identified on this noncontrast exam, but considerations include a lobar hypertensive bleed, hemorrhage due to cerebral amyloid angiopathy, or a posttraumatic intracerebral hemorrhage. Embolic infarct with reperfusion, arteriovenous  malformation, or hemorrhagic metastasis, are less likely. The lesion has decompressed somewhat into the ventricular system, with cross-sectional measurements of the hematoma cavity corresponding to approximate 20 mL volume. 4 mm of LEFT-to-RIGHT shift. Extreme atrophy and small vessel disease. Motion degraded intracranial MRA  demonstrates no flow-limiting stenosis or enlarged feeding vessels. Extracranial MRA demonstrates no flow-limiting stenosis in the anterior circulation. There is a severe stenosis of the non dominant distal vertebral artery at its junction with the basilar. Electronically Signed   By: Staci Righter M.D.   On: 03/30/2017 14:14   Dg Chest Port 1 View  Result Date: 03/30/2017 CLINICAL DATA:  History of breast and renal carcinoma. Intracerebral hemorrhage. EXAM: PORTABLE CHEST 1 VIEW COMPARISON:  Chest radiograph July 07, 2014 and chest CT February 29, 2018 FINDINGS: Port-A-Cath tip is at the cavoatrial junction. No pneumothorax. There is no appreciable edema or consolidation. Heart is mildly enlarged with pulmonary vascularity within normal limits. Aorta is tortuous with aortic atherosclerosis. No evident adenopathy. Bones appear osteoporotic. No focal bone lesions are evident. IMPRESSION: Port-A-Cath as described without pneumothorax. Cardiomegaly with pulmonary vascularity within normal limits. No edema or consolidation. Tortuous aorta with aortic atherosclerosis noted. Aortic Atherosclerosis (ICD10-I70.0). Electronically Signed   By: Lowella Grip III M.D.   On: 03/30/2017 08:35    Medications:  I have reviewed the patient's current medications. Scheduled: . atorvastatin  20 mg Oral q1800  . chlorhexidine  15 mL Mouth Rinse BID  . donepezil  10 mg Oral QHS  . hydrochlorothiazide  25 mg Oral Daily  . latanoprost  1 drop Both Eyes QHS  . letrozole  2.5 mg Oral Daily  . levothyroxine  75 mcg Oral QAC breakfast  . lisinopril  5 mg Oral Daily  . mouth rinse  15 mL Mouth Rinse  q12n4p  . memantine  5 mg Oral BID  . metoprolol tartrate  25 mg Oral BID  . pantoprazole (PROTONIX) IV  40 mg Intravenous QHS    Assessment/Plan: Patient agitated overnight.  With location of [place] would not be surprising if patient has some mental status changes.  MRI of the brain performed on yesterday and reviewed.  Continues to show left frontal lobe hemorrhage, now with some intraventricular extension and 17mm of left to right midline shift.  MRA shows severe stenosis of the nondominant distal vertebral.   Patient on Keppra.  Recommendations: 1.  EEG 2.  Would attempt to taper Precedex.  If continued mental status issues would change anticonvulsant therapy which may be causing side effects.    LOS: 2 days   Alexis Goodell, MD Neurology [phone] 03/31/2017  11:29 AM

## 2017-04-01 LAB — GLUCOSE, CAPILLARY
GLUCOSE-CAPILLARY: 113 mg/dL — AB (ref 65–99)
GLUCOSE-CAPILLARY: 110 mg/dL — AB (ref 65–99)
GLUCOSE-CAPILLARY: 116 mg/dL — AB (ref 65–99)
GLUCOSE-CAPILLARY: 118 mg/dL — AB (ref 65–99)
Glucose-Capillary: 110 mg/dL — ABNORMAL HIGH (ref 65–99)
Glucose-Capillary: 123 mg/dL — ABNORMAL HIGH (ref 65–99)

## 2017-04-01 LAB — BASIC METABOLIC PANEL
ANION GAP: 8 (ref 5–15)
BUN: 24 mg/dL — ABNORMAL HIGH (ref 6–20)
CALCIUM: 8.6 mg/dL — AB (ref 8.9–10.3)
CO2: 21 mmol/L — AB (ref 22–32)
CREATININE: 0.65 mg/dL (ref 0.44–1.00)
Chloride: 110 mmol/L (ref 101–111)
Glucose, Bld: 136 mg/dL — ABNORMAL HIGH (ref 65–99)
Potassium: 3 mmol/L — ABNORMAL LOW (ref 3.5–5.1)
Sodium: 139 mmol/L (ref 135–145)

## 2017-04-01 LAB — CBC
HCT: 38 % (ref 35.0–47.0)
HEMOGLOBIN: 12.7 g/dL (ref 12.0–16.0)
MCH: 25.8 pg — AB (ref 26.0–34.0)
MCHC: 33.4 g/dL (ref 32.0–36.0)
MCV: 77.3 fL — AB (ref 80.0–100.0)
Platelets: 201 10*3/uL (ref 150–440)
RBC: 4.92 MIL/uL (ref 3.80–5.20)
RDW: 17.1 % — ABNORMAL HIGH (ref 11.5–14.5)
WBC: 8.5 10*3/uL (ref 3.6–11.0)

## 2017-04-01 LAB — MAGNESIUM: Magnesium: 2.2 mg/dL (ref 1.7–2.4)

## 2017-04-01 LAB — HEMOGLOBIN A1C
Hgb A1c MFr Bld: 6 % — ABNORMAL HIGH (ref 4.8–5.6)
Mean Plasma Glucose: 126 mg/dL

## 2017-04-01 LAB — POTASSIUM: Potassium: 3.8 mmol/L (ref 3.5–5.1)

## 2017-04-01 LAB — PHOSPHORUS: Phosphorus: 2.7 mg/dL (ref 2.5–4.6)

## 2017-04-01 MED ORDER — POTASSIUM CHLORIDE 10 MEQ/100ML IV SOLN
10.0000 meq | INTRAVENOUS | Status: AC
  Administered 2017-04-01 (×2): 10 meq via INTRAVENOUS
  Filled 2017-04-01 (×2): qty 100

## 2017-04-01 MED ORDER — QUETIAPINE FUMARATE 25 MG PO TABS
25.0000 mg | ORAL_TABLET | Freq: Every day | ORAL | Status: DC
  Administered 2017-04-01: 25 mg via ORAL
  Filled 2017-04-01: qty 1

## 2017-04-01 MED ORDER — SODIUM CHLORIDE 0.9 % IV SOLN
100.0000 mg | Freq: Two times a day (BID) | INTRAVENOUS | Status: DC
  Administered 2017-04-02: 100 mg via INTRAVENOUS
  Filled 2017-04-01 (×3): qty 10

## 2017-04-01 MED ORDER — QUETIAPINE FUMARATE 25 MG PO TABS: 25.0000 mg | ORAL_TABLET | Freq: Every day | ORAL | Status: DC

## 2017-04-01 MED ORDER — LISINOPRIL 5 MG PO TABS
10.0000 mg | ORAL_TABLET | Freq: Every day | ORAL | Status: DC
Start: 2017-04-01 — End: 2017-04-02
  Administered 2017-04-01: 10 mg via ORAL
  Filled 2017-04-01: qty 2

## 2017-04-01 MED ORDER — POTASSIUM CHLORIDE 20 MEQ PO PACK
60.0000 meq | PACK | Freq: Once | ORAL | Status: AC
  Administered 2017-04-01: 60 meq via ORAL
  Filled 2017-04-01: qty 3

## 2017-04-01 NOTE — Progress Notes (Signed)
Pharmacy Electrolyte Monitoring Consult:  Pharmacy consulted to assist in monitoring and replacing electrolytes in this 83 y.o. female admitted on 03/28/2017 with Fall   Labs:  Sodium (mmol/L)  Date Value  04/01/2017 139  10/14/2016 147 (H)  08/06/2014 137   Potassium (mmol/L)  Date Value  04/01/2017 3.0 (L)  08/06/2014 3.3 (L)   Magnesium (mg/dL)  Date Value  04/01/2017 2.2   Phosphorus (mg/dL)  Date Value  04/01/2017 2.7   Calcium (mg/dL)  Date Value  04/01/2017 8.6 (L)   Calcium, Total (mg/dL)  Date Value  08/06/2014 9.3   Albumin (g/dL)  Date Value  03/28/2017 3.7  10/14/2016 4.4  08/06/2014 3.7    Plan: Patient ordered potassium 60 mEq packet PO x 1 and potassium 4mEq IV x 1 per MD. Patient on hydrochlorothiazide.   MD plans to recheck K+ at 1400. Will recheck electrolytes with am labs.   Pharmacy will continue to monitor and adjust per consult.    Sears Oran A 04/01/2017 9:42 AM

## 2017-04-01 NOTE — Progress Notes (Signed)
Subjective: Patient remains agitated at times.  Back on Precedex.    Objective: Current vital signs: BP 140/76   Pulse 62   Temp 98.6 F (37 C) (Axillary)   Resp 13   Ht 5\' 4"  (1.626 m)   Wt 63.6 kg (140 lb 3.4 oz)   SpO2 95%   BMI 24.07 kg/m  Vital signs in last 24 hours: Temp:  [97.9 F (36.6 C)-98.6 F (37 C)] 98.6 F (37 C) (12/15 0730) Pulse Rate:  [57-84] 62 (12/15 1130) Resp:  [11-24] 13 (12/15 1130) BP: (89-158)/(50-81) 140/76 (12/15 1130) SpO2:  [90 %-96 %] 95 % (12/15 1130)  Intake/Output from previous day: 12/14 0701 - 12/15 0700 In: 900.2 [I.V.:695.2; IV Piggyback:205] Out: -  Intake/Output this shift: Total I/O In: 204.8 [I.V.:104.8; IV Piggyback:100] Out: -  Nutritional status: DIET - DYS 1 Room service appropriate? Yes with Assist; Fluid consistency: Nectar Thick  Neurologic Exam: Mental Status: Lethargic.  Does not follow commands.  No speech.   Cranial Nerves: II: Discs flat bilaterally; Pupils equal, round, reactive to light and accommodation III,IV, VI: eyes forcibly closed.  No eye deviation noted with active eye opening.   V,VII: Patient resists eye opening bilaterally, stronger on the left than on the right.   VIII: unable to test IX,X: unable to test XI: unable to test XII: unable to test Motor: No spontaneous limb movement noted   Lab Results: Basic Metabolic Panel: Recent Labs  Lab 03/28/17 2200 03/29/17 1337 03/29/17 2115 03/30/17 0539 03/31/17 0351 04/01/17 0459 04/01/17 0720  NA 140 140  --  137 138 139  --   K 3.3* 3.1* 3.7 3.0* 3.0* 3.0*  --   CL 109 109  --  108 110 110  --   CO2 23 23  --  20* 21* 21*  --   GLUCOSE 148* 128*  --  128* 147* 136*  --   BUN 23* 19  --  18 28* 24*  --   CREATININE 0.82 0.68  --  0.60 0.58 0.65  --   CALCIUM 8.8* 8.4*  --  8.7* 8.6* 8.6*  --   MG  --  2.1  --  2.1  --   --  2.2  PHOS  --   --   --   --  2.1* 2.7  --     Liver Function Tests: Recent Labs  Lab 03/28/17 2200  AST 26   ALT 13*  ALKPHOS 49  BILITOT 0.7  PROT 6.8  ALBUMIN 3.7   No results for input(s): LIPASE, AMYLASE in the last 168 hours. No results for input(s): AMMONIA in the last 168 hours.  CBC: Recent Labs  Lab 03/28/17 2200 04/01/17 0720  WBC 8.7 8.5  HGB 11.8* 12.7  HCT 36.4 38.0  MCV 78.4* 77.3*  PLT 176 201    Cardiac Enzymes: Recent Labs  Lab 03/28/17 2200  CKTOTAL 882*    Lipid Panel: Recent Labs  Lab 03/31/17 0351  CHOL 167  TRIG 58  HDL 72  CHOLHDL 2.3  VLDL 12  LDLCALC 83    CBG: Recent Labs  Lab 03/31/17 2114 04/01/17 0114 04/01/17 0400 04/01/17 0725 04/01/17 1142  GLUCAP 145* 113* 110* 110* 32*    Microbiology: Results for orders placed or performed during the hospital encounter of 03/28/17  Urine culture     Status: None   Collection Time: 03/28/17  8:18 PM  Result Value Ref Range Status   Specimen Description URINE, CATHETERIZED  Final   Special Requests NONE  Final   Culture   Final    NO GROWTH Performed at [facility], [place] [address], [place], [place] [address]    Report Status 03/30/2017 FINAL  Final  MRSA PCR Screening     Status: None   Collection Time: 03/29/17  2:42 AM  Result Value Ref Range Status   MRSA by PCR NEGATIVE NEGATIVE Final    Comment:        The GeneXpert MRSA Assay (FDA approved for NASAL specimens only), is one component of a comprehensive MRSA colonization surveillance program. It is not intended to diagnose MRSA infection nor to guide or monitor treatment for MRSA infections.     Coagulation Studies: No results for input(s): LABPROT, INR in the last 72 hours.  Imaging: Ct Chest Wo Contrast  Result Date: 03/30/2017 CLINICAL DATA:  Followup lung nodule. EXAM: CT CHEST WITHOUT CONTRAST TECHNIQUE: Multidetector CT imaging of the chest was performed following the standard protocol without IV contrast. COMPARISON:  06/08/2016 FINDINGS: Cardiovascular: Moderate cardiac enlargement. There is aortic  atherosclerosis noted. Calcifications within the LAD and left circumflex and RCA coronary artery is noted. No pericardial effusion identified. Stable appearance of the thoracic aorta. The descending thoracic aorta is increased in caliber measuring 4.1 cm. Mediastinum/Nodes: The trachea appears patent and is midline. Normal appearance of the esophagus. 9 mm right paratracheal lymph node is unchanged from previous exam. Within the limitations of unenhanced technique there is no hilar adenopathy. No enlarged supraclavicular or axillary nodes identified. Lungs/Pleura: Moderate changes of emphysema. Masslike architectural distortion within the medial aspect of the right upper lobe measures 2.2 by 1.3 by 4.3 cm, image 58 of series 5 and image 37 of series 2. On the previous exam this measured 2.3 x 1.6 by 4.3 cm. Index spiculated nodule within the left upper lobe measures 6 x 5 mm, image 40 of series 3. Unchanged from previous exam. Ground-glass attenuating nodule within the anterolateral left apex is stable measuring 7 mm, image 22 of series 3. Upper Abdomen: Unchanged left lobe of liver cyst measuring 1.9 cm, image 113 of series 2. No acute abnormality noted within the upper abdomen. Unchanged partially calcified lesion arising from the upper pole of left kidney. Incompletely characterized without IV contrast. Musculoskeletal: No aggressive lytic or sclerotic bone lesions identified. IMPRESSION: 1. Stable appearance of bilateral pulmonary lesions including masslike architectural distortion within the medial right upper lobe, ground-glass attenuating nodule in the left apex and spiculated nodule in the left upper lobe. 2. Aortic Atherosclerosis (ICD10-I70.0) and Emphysema (ICD10-J43.9). 3. Stable aneurysmal dilatation of the descending thoracic aorta. Electronically Signed   By: Kerby Moors M.D.   On: 03/30/2017 15:29   Mr Jodene Nam Head Wo Contrast  Result Date: 03/30/2017 CLINICAL DATA:  Patient found unresponsive  after a fall. Past history of breast and lung cancer. EXAM: MRI HEAD WITHOUT CONTRAST MRA HEAD WITHOUT CONTRAST MRA NECK WITHOUT CONTRAST TECHNIQUE: Multiplanar, multiecho pulse sequences of the brain and surrounding structures were obtained without intravenous contrast. Angiographic images of the Circle of Willis were obtained using MRA technique without intravenous contrast. Angiographic images of the neck were obtained using MRA technique without intravenous contrast. Carotid stenosis measurements (when applicable) are obtained utilizing NASCET criteria, using the distal internal carotid diameter as the denominator. COMPARISON:  CT head 03/28/2017.  MR head 09/28/2015. FINDINGS: MRI HEAD FINDINGS Brain: Large intraparenchymal hemorrhage LEFT frontal lobe, with partial retraction of the clot. Predominant signal characteristic is T2  shortening representing Deoxyhemoglobin, although there is some T1 shortening consistent with conversion to methemoglobin. Cross-sectional measurements of 18 x 45 x 49 mm, corresponding to a volume of approximately 20 mL. LEFT-to-RIGHT anterior frontal lobe shift under the falx. LEFT-to-RIGHT shift at the level of the septum pellucidum is approximately 4 mm. Mild surrounding edema. Artifactual bright signal on diffusion-weighted imaging, due to blood products. Extreme atrophy. Chronic microvascular ischemic change of an advanced nature. 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 Chronic bland infarcts of the LEFT frontal and temporal lobes, which were acute in 2017. No contrast was given, although the internal architecture of the bleed does not suggest an underlying mass lesion. Vascular: Reported separately. Skull and upper cervical spine: Limited visualization. No marrow destructive process. Sinuses/Orbits: Negative. Other: None. MRA HEAD FINDINGS Motion degraded exam. There is patency of the cervical internal carotid arteries throughout their upper  cervical and petrous segments. Signal loss in the BILATERAL cavernous ICA segments appears to be related to motion degradation rather than discrete lesion. BILATERAL ICA termini widely patent. There is no RIGHT or LEFT flow-limiting stenosis of the M1 MCA segments. BILATERAL anterior cerebral arteries are patent, with LEFT-to-RIGHT shift of the A3 segments. Artifactual signal loss of the BILATERAL MCA bifurcations. Basilar artery is patent with mild irregularity proximally. The RIGHT vertebral is the dominant contributor to the basilar, with irregularity in the V4 segment. The LEFT vertebral is non dominant, with a severe stenosis at its junction with the basilar. Mild irregularity of both PCA segments, non flow reducing. BILATERAL superior cerebellar arteries are unremarkable. Poorly visualized anterior inferior and posterior inferior cerebellar arteries. No enlarged anterior circulation vessels to suggest arteriovenous malformation. No evidence for saccular aneurysm, although BILATERAL MCA bifurcations are poorly visualized due to artifactual signal loss. MRA NECK FINDINGS Noncontrast examination was performed. This examination is targeted to the carotid bifurcations and does not include the arch. The visualized portions of the common carotid arteries are widely patent. There is no flow-limiting stenosis, or dissection involving the carotid bifurcations. Proximal internal carotid artery segments are widely patent. There is no dissection, fibromuscular change, or pseudoaneurysm involving the cervical ICA segments. Both vertebral arteries are widely patent throughout their neck to the skullbase, with the RIGHT dominant. IMPRESSION: Large intraparenchymal hemorrhage in the LEFT frontal lobe is redemonstrated, corresponding to a late acute/ early subacute time course. No specific etiology is identified on this noncontrast exam, but considerations include a lobar hypertensive bleed, hemorrhage due to cerebral amyloid  angiopathy, or a posttraumatic intracerebral hemorrhage. Embolic infarct with reperfusion, arteriovenous malformation, or hemorrhagic metastasis, are less likely. The lesion has decompressed somewhat into the ventricular system, with cross-sectional measurements of the hematoma cavity corresponding to approximate 20 mL volume. 4 mm of LEFT-to-RIGHT shift. Extreme atrophy and small vessel disease. Motion degraded intracranial MRA demonstrates no flow-limiting stenosis or enlarged feeding vessels. Extracranial MRA demonstrates no flow-limiting stenosis in the anterior circulation. There is a severe stenosis of the non dominant distal vertebral artery at its junction with the basilar. Electronically Signed   By: Staci Righter M.D.   On: 03/30/2017 14:14   Mr Jodene Nam Neck Wo Contrast  Result Date: 03/30/2017 CLINICAL DATA:  Patient found unresponsive after a fall. Past history of breast and lung cancer. EXAM: MRI HEAD WITHOUT CONTRAST MRA HEAD WITHOUT CONTRAST MRA NECK WITHOUT CONTRAST TECHNIQUE: Multiplanar, multiecho pulse sequences of the brain and surrounding structures were obtained without intravenous contrast. Angiographic images of the Circle of Willis were obtained using MRA  technique without intravenous contrast. Angiographic images of the neck were obtained using MRA technique without intravenous contrast. Carotid stenosis measurements (when applicable) are obtained utilizing NASCET criteria, using the distal internal carotid diameter as the denominator. COMPARISON:  CT head 03/28/2017.  MR head 09/28/2015. FINDINGS: MRI HEAD FINDINGS Brain: Large intraparenchymal hemorrhage LEFT frontal lobe, with partial retraction of the clot. Predominant signal characteristic is T2 shortening representing Deoxyhemoglobin, although there is some T1 shortening consistent with conversion to methemoglobin. Cross-sectional measurements of 18 x 45 x 49 mm, corresponding to a volume of approximately 20 mL. LEFT-to-RIGHT  anterior frontal lobe shift under the falx. LEFT-to-RIGHT shift at the level of the septum pellucidum is approximately 4 mm. Mild surrounding edema. Artifactual bright signal on diffusion-weighted imaging, due to blood products. Extreme atrophy. Chronic microvascular ischemic change of an advanced nature. 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 Chronic bland infarcts of the LEFT frontal and temporal lobes, which were acute in 2017. No contrast was given, although the internal architecture of the bleed does not suggest an underlying mass lesion. Vascular: Reported separately. Skull and upper cervical spine: Limited visualization. No marrow destructive process. Sinuses/Orbits: Negative. Other: None. MRA HEAD FINDINGS Motion degraded exam. There is patency of the cervical internal carotid arteries throughout their upper cervical and petrous segments. Signal loss in the BILATERAL cavernous ICA segments appears to be related to motion degradation rather than discrete lesion. BILATERAL ICA termini widely patent. There is no RIGHT or LEFT flow-limiting stenosis of the M1 MCA segments. BILATERAL anterior cerebral arteries are patent, with LEFT-to-RIGHT shift of the A3 segments. Artifactual signal loss of the BILATERAL MCA bifurcations. Basilar artery is patent with mild irregularity proximally. The RIGHT vertebral is the dominant contributor to the basilar, with irregularity in the V4 segment. The LEFT vertebral is non dominant, with a severe stenosis at its junction with the basilar. Mild irregularity of both PCA segments, non flow reducing. BILATERAL superior cerebellar arteries are unremarkable. Poorly visualized anterior inferior and posterior inferior cerebellar arteries. No enlarged anterior circulation vessels to suggest arteriovenous malformation. No evidence for saccular aneurysm, although BILATERAL MCA bifurcations are poorly visualized due to artifactual signal loss. MRA NECK  FINDINGS Noncontrast examination was performed. This examination is targeted to the carotid bifurcations and does not include the arch. The visualized portions of the common carotid arteries are widely patent. There is no flow-limiting stenosis, or dissection involving the carotid bifurcations. Proximal internal carotid artery segments are widely patent. There is no dissection, fibromuscular change, or pseudoaneurysm involving the cervical ICA segments. Both vertebral arteries are widely patent throughout their neck to the skullbase, with the RIGHT dominant. IMPRESSION: Large intraparenchymal hemorrhage in the LEFT frontal lobe is redemonstrated, corresponding to a late acute/ early subacute time course. No specific etiology is identified on this noncontrast exam, but considerations include a lobar hypertensive bleed, hemorrhage due to cerebral amyloid angiopathy, or a posttraumatic intracerebral hemorrhage. Embolic infarct with reperfusion, arteriovenous malformation, or hemorrhagic metastasis, are less likely. The lesion has decompressed somewhat into the ventricular system, with cross-sectional measurements of the hematoma cavity corresponding to approximate 20 mL volume. 4 mm of LEFT-to-RIGHT shift. Extreme atrophy and small vessel disease. Motion degraded intracranial MRA demonstrates no flow-limiting stenosis or enlarged feeding vessels. Extracranial MRA demonstrates no flow-limiting stenosis in the anterior circulation. There is a severe stenosis of the non dominant distal vertebral artery at its junction with the basilar. Electronically Signed   By: Staci Righter M.D.   On: 03/30/2017 14:14  Mr Brain Wo Contrast  Result Date: 03/30/2017 CLINICAL DATA:  Patient found unresponsive after a fall. Past history of breast and lung cancer. EXAM: MRI HEAD WITHOUT CONTRAST MRA HEAD WITHOUT CONTRAST MRA NECK WITHOUT CONTRAST TECHNIQUE: Multiplanar, multiecho pulse sequences of the brain and surrounding structures  were obtained without intravenous contrast. Angiographic images of the Circle of Willis were obtained using MRA technique without intravenous contrast. Angiographic images of the neck were obtained using MRA technique without intravenous contrast. Carotid stenosis measurements (when applicable) are obtained utilizing NASCET criteria, using the distal internal carotid diameter as the denominator. COMPARISON:  CT head 03/28/2017.  MR head 09/28/2015. FINDINGS: MRI HEAD FINDINGS Brain: Large intraparenchymal hemorrhage LEFT frontal lobe, with partial retraction of the clot. Predominant signal characteristic is T2 shortening representing Deoxyhemoglobin, although there is some T1 shortening consistent with conversion to methemoglobin. Cross-sectional measurements of 18 x 45 x 49 mm, corresponding to a volume of approximately 20 mL. LEFT-to-RIGHT anterior frontal lobe shift under the falx. LEFT-to-RIGHT shift at the level of the septum pellucidum is approximately 4 mm. Mild surrounding edema. Artifactual bright signal on diffusion-weighted imaging, due to blood products. Extreme atrophy. Chronic microvascular ischemic change of an advanced nature. 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 Chronic bland infarcts of the LEFT frontal and temporal lobes, which were acute in 2017. No contrast was given, although the internal architecture of the bleed does not suggest an underlying mass lesion. Vascular: Reported separately. Skull and upper cervical spine: Limited visualization. No marrow destructive process. Sinuses/Orbits: Negative. Other: None. MRA HEAD FINDINGS Motion degraded exam. There is patency of the cervical internal carotid arteries throughout their upper cervical and petrous segments. Signal loss in the BILATERAL cavernous ICA segments appears to be related to motion degradation rather than discrete lesion. BILATERAL ICA termini widely patent. There is no RIGHT or LEFT flow-limiting  stenosis of the M1 MCA segments. BILATERAL anterior cerebral arteries are patent, with LEFT-to-RIGHT shift of the A3 segments. Artifactual signal loss of the BILATERAL MCA bifurcations. Basilar artery is patent with mild irregularity proximally. The RIGHT vertebral is the dominant contributor to the basilar, with irregularity in the V4 segment. The LEFT vertebral is non dominant, with a severe stenosis at its junction with the basilar. Mild irregularity of both PCA segments, non flow reducing. BILATERAL superior cerebellar arteries are unremarkable. Poorly visualized anterior inferior and posterior inferior cerebellar arteries. No enlarged anterior circulation vessels to suggest arteriovenous malformation. No evidence for saccular aneurysm, although BILATERAL MCA bifurcations are poorly visualized due to artifactual signal loss. MRA NECK FINDINGS Noncontrast examination was performed. This examination is targeted to the carotid bifurcations and does not include the arch. The visualized portions of the common carotid arteries are widely patent. There is no flow-limiting stenosis, or dissection involving the carotid bifurcations. Proximal internal carotid artery segments are widely patent. There is no dissection, fibromuscular change, or pseudoaneurysm involving the cervical ICA segments. Both vertebral arteries are widely patent throughout their neck to the skullbase, with the RIGHT dominant. IMPRESSION: Large intraparenchymal hemorrhage in the LEFT frontal lobe is redemonstrated, corresponding to a late acute/ early subacute time course. No specific etiology is identified on this noncontrast exam, but considerations include a lobar hypertensive bleed, hemorrhage due to cerebral amyloid angiopathy, or a posttraumatic intracerebral hemorrhage. Embolic infarct with reperfusion, arteriovenous malformation, or hemorrhagic metastasis, are less likely. The lesion has decompressed somewhat into the ventricular system, with  cross-sectional measurements of the hematoma cavity corresponding to approximate 20 mL volume.  4 mm of LEFT-to-RIGHT shift. Extreme atrophy and small vessel disease. Motion degraded intracranial MRA demonstrates no flow-limiting stenosis or enlarged feeding vessels. Extracranial MRA demonstrates no flow-limiting stenosis in the anterior circulation. There is a severe stenosis of the non dominant distal vertebral artery at its junction with the basilar. Electronically Signed   By: Staci Righter M.D.   On: 03/30/2017 14:14    Medications:  I have reviewed the patient's current medications. Scheduled: . atorvastatin  20 mg Oral q1800  . chlorhexidine  15 mL Mouth Rinse BID  . donepezil  10 mg Oral QHS  . hydrochlorothiazide  25 mg Oral Daily  . latanoprost  1 drop Both Eyes QHS  . letrozole  2.5 mg Oral Daily  . levothyroxine  75 mcg Oral QAC breakfast  . lisinopril  10 mg Oral Daily  . mouth rinse  15 mL Mouth Rinse q12n4p  . memantine  5 mg Oral BID  . metoprolol tartrate  25 mg Oral BID  . pantoprazole (PROTONIX) IV  40 mg Intravenous QHS  . QUEtiapine  25 mg Oral Daily    Assessment/Plan: Patient continues to have periods of agitation.  Seroquel started.  BP controlled.  EEG only significant for slowing.  With continued AMS will change AED.  Recommendations: 1.  D/C Keppra 2.  Start Vimpat 100mg  IV q 12 hours.     LOS: 3 days   Alexis Goodell, MD Neurology [phone] 04/01/2017  11:57 AM

## 2017-04-01 NOTE — Progress Notes (Signed)
Pharmacy Electrolyte Monitoring Consult:  Pharmacy consulted to assist in monitoring and replacing electrolytes in this 83 y.o. female admitted on 03/28/2017 with Fall   Labs:  Sodium (mmol/L)  Date Value  04/01/2017 139  10/14/2016 147 (H)  08/06/2014 137   Potassium (mmol/L)  Date Value  04/01/2017 3.8  08/06/2014 3.3 (L)   Magnesium (mg/dL)  Date Value  04/01/2017 2.2   Phosphorus (mg/dL)  Date Value  04/01/2017 2.7   Calcium (mg/dL)  Date Value  04/01/2017 8.6 (L)   Calcium, Total (mg/dL)  Date Value  08/06/2014 9.3   Albumin (g/dL)  Date Value  03/28/2017 3.7  10/14/2016 4.4  08/06/2014 3.7    Plan: Patient ordered potassium 60 mEq packet PO x 1 and potassium 31mEq IV x 1 per MD. Patient on hydrochlorothiazide.   12/15 1400 K+ WNL. No additonal replacement needed.   Labs ordered for AM. Pharmacy will continue to monitor and adjust per consult.    Pernell Dupre, PharmD, BCPS Clinical Pharmacist 04/01/2017 2:35 PM

## 2017-04-01 NOTE — Evaluation (Signed)
Occupational Therapy Evaluation Patient Details Name: Alexandra Glass MRN: [id number] DOB: [birth year] Today's Date: 04/01/2017    History of Present Illness Pt is a 83 y/o F s/p fall with R frontal ICH with midline shift with findings consistent with amyloid angiopathy.  She was found on the floor unresponsive.  Carotid dopplers show no evidence of hemodynamically significant stenosis.  Neurosurgery stated that this was not intervenable, and recommended medical management in the ICU. Echocardiogram shows no cardiac source of emboli with an EF of 55%. MRI brain on 12/13 showed slight interval increase in the size of the L F IPH, though the basal cisterns and other pertinent spinal fluid spaces show patency.  Received PT order and verbal confirmation from Dr. Doy Mince that pt may be seen by PT on 12/14 with no restrictions.Ascending aorta atherosclerosis noted.  Pt's PMH includes dementia, R breast cancer s/p mastectomy, renal cancer.   Clinical Impression   Patient was lying in bed when OT arrived. Nursing stated patient was agitated and  moving quite a bit in bed last night. Received medication, and has been lethargic all day today. Patient kept eyes closed entire session, and did not verbalize. Multiple visitors at bedside, and able to provide social history and PLOF. Patient did not acknowledge therapist, but with cuing and PROM did start to move BUE with therapist during ROM. Unable to move through ROM on her own during this session. Patient did adjust LUE to place back under blanket, and did open left hand on command. No other commands followed. Patient would benefit from continued OT services with focus on awareness, following simple commands, and activity tolerance for ADL tasks as able. Patient would currently benefit from SNF placement when medically stable.     Follow Up Recommendations  SNF    Equipment Recommendations       Recommendations for Other Services       Precautions /  Restrictions Precautions Precautions: Fall Precaution Comments: Monitor BP; port L chest Restrictions Weight Bearing Restrictions: No      Mobility Bed Mobility               General bed mobility comments: Pt too lethargic to participate in bed mobility  Transfers                      Balance                                           ADL either performed or assessed with clinical judgement   ADL Overall ADL's : Needs assistance/impaired                                       General ADL Comments: Patient did not open eyes or respond during evaluation. Note from nursing that patient will "bite" on straw (possible confusion).     Vision         Perception     Praxis      Pertinent Vitals/Pain Pain Location: No signs of pain Pain Intervention(s): Monitored during session     Hand Dominance     Extremity/Trunk Assessment Upper Extremity Assessment RUE Deficits / Details: Unable to formally assess. OT performed PROM/AAROM BUE with patient starting to assist during session. Did adjust RUE under blanket when lifted at  end of session. LUE Deficits / Details: Unable to formally assess. OT performed PROM/AAROM BUE with patient starting to assist during session.            Communication Communication Communication: Other (comment)(Patient kept eye closed and did not respond during evalutation.)   Cognition Arousal/Alertness: Lethargic   Overall Cognitive Status: Difficult to assess                                 General Comments: did not awaken for session.  no verbal responses   General Comments       Exercises General Exercises - Upper Extremity Shoulder Flexion: PROM;AAROM;Both;10 reps;Supine Elbow Flexion: PROM;AAROM;Both;Supine;10 reps Elbow Extension: PROM;AAROM;Both;10 reps;Supine   Shoulder Instructions      Home Living Family/patient expects to be discharged to:: Skilled nursing  facility Living Arrangements: Alone                               Additional Comments: Per chart review the pt lives alone at home, pt unable to provide additional information.       Prior Functioning/Environment Level of Independence: Independent        Comments: Per notes the pt with moderate dementia at baseline but lives alone and is able to complete most of ADLs independently.  Some family/friends available today. State that patient was performing all self-care and sweeping porch up until admission to hospital. Only performed light meal prep (breakfast).        OT Problem List: Decreased strength;Decreased activity tolerance;Decreased cognition      OT Treatment/Interventions: Patient/family education;Self-care/ADL training;Therapeutic activities;Therapeutic exercise    OT Goals(Current goals can be found in the care plan section) Acute Rehab OT Goals Patient Stated Goal: unable to state  OT Frequency: Min 2X/week   Barriers to D/C: Decreased caregiver support          Co-evaluation              AM-PAC PT "6 Clicks" Daily Activity     Outcome Measure Help from another person eating meals?: Total Help from another person taking care of personal grooming?: Total Help from another person toileting, which includes using toliet, bedpan, or urinal?: Total Help from another person bathing (including washing, rinsing, drying)?: Total Help from another person to put on and taking off regular upper body clothing?: Total Help from another person to put on and taking off regular lower body clothing?: Total 6 Click Score: 6   End of Session Nurse Communication: Other (comment)(Status during evaluation)  Activity Tolerance: Patient limited by lethargy Patient left: in bed;with family/visitor present  OT Visit Diagnosis: Other symptoms and signs involving cognitive function;Muscle weakness (generalized) (M62.81)                Time: 6213-0865 OT Time  Calculation (min): 16 min Charges:  OT General Charges $OT Visit: 1 Visit OT Evaluation $OT Eval Low Complexity: 1 Low OT Treatments $Therapeutic Exercise: 8-22 mins G-Codes:     Amie [place], OTR/L  Bettey Muraoka L 04/01/2017, 6:00 PM

## 2017-04-01 NOTE — Progress Notes (Signed)
Physical Therapy Treatment Patient Details Name: Alexandra Glass MRN: [id number] DOB: 1924-08-19 Today's Date: 04/01/2017    History of Present Illness Pt is a 83 y/o F s/p fall with R frontal ICH with midline shift with findings consistent with amyloid angiopathy.  She was found on the floor unresponsive.  Carotid dopplers show no evidence of hemodynamically significant stenosis.  Neurosurgery stated that this was not intervenable, and recommended medical management in the ICU. Echocardiogram shows no cardiac source of emboli with an EF of 55%. MRI brain on 12/13 showed slight interval increase in the size of the L F IPH, though the basal cisterns and other pertinent spinal fluid spaces show patency.  Received PT order and verbal confirmation from Dr. Doy Mince that pt may be seen by PT on 12/14 with no restrictions.Ascending aorta atherosclerosis noted.  Pt's PMH includes dementia, R breast cancer s/p mastectomy, renal cancer.    PT Comments    Participated in exercises as described below.  Session limited by lethargy.  Did not awaken or actively participate in EX this am.  Discussed with primary nurse.    Follow Up Recommendations  SNF     Equipment Recommendations       Recommendations for Other Services       Precautions / Restrictions Precautions Precautions: Fall;Other (comment) Precaution Comments: Monitor BP; port L chest Restrictions Weight Bearing Restrictions: No    Mobility  Bed Mobility                  Transfers                    Ambulation/Gait                 Stairs            Wheelchair Mobility    Modified Rankin (Stroke Patients Only)       Balance                                            Cognition Arousal/Alertness: Lethargic   Overall Cognitive Status: No family/caregiver present to determine baseline cognitive functioning                                 General Comments: did not  awaken for session.  no verbal responses      Exercises Other Exercises Other Exercises: BLE/UE general ROM exercises x 5 to attempt to awaken pt and to increase comfort.  She did not awaken with verbal and tactile stimulation today.    General Comments        Pertinent Vitals/Pain Pain Assessment: No/denies pain Pain Location: No signs of pain    Home Living                      Prior Function            PT Goals (current goals can now be found in the care plan section) Progress towards PT goals: Progressing toward goals    Frequency    7X/week      PT Plan Current plan remains appropriate    Co-evaluation              AM-PAC PT "6 Clicks" Daily Activity  Outcome Measure  Difficulty turning over in bed (  including adjusting bedclothes, sheets and blankets)?: Unable Difficulty moving from lying on back to sitting on the side of the bed? : Unable Difficulty sitting down on and standing up from a chair with arms (e.g., wheelchair, bedside commode, etc,.)?: Unable Help needed moving to and from a bed to chair (including a wheelchair)?: Total Help needed walking in hospital room?: Total Help needed climbing 3-5 steps with a railing? : Total 6 Click Score: 6    End of Session   Activity Tolerance: Patient limited by lethargy Patient left: in bed;with call bell/phone within reach;with bed alarm set Nurse Communication: Other (comment)       Time: 1173-5670 PT Time Calculation (min) (ACUTE ONLY): 8 min  Charges:  $Therapeutic Exercise: 8-22 mins                    G Codes:       Chesley Noon, PTA 04/01/17, 11:18 AM

## 2017-04-01 NOTE — Progress Notes (Signed)
[facility] Consultation     Date: 04/01/2017,   MRN# [id number] Phoenix Riesen July 19, 1924     AdmissionWeight: 63.6 kg (140 lb 3.4 oz)                 CurrentWeight: 63.6 kg (140 lb 3.4 oz) Angles Trevizo is a 83 y.o. old female    SUBJECTIVE:   Still gets agitated and attempting to get out of bed. Precedex restarted last night. Blood pressure in the 140s.  Hemodynamically stable. Occasionally follows commands. Remains on nicardipine drip at 2.5issues overnight  MEDICATIONS     Current Medication:   Current Facility-Administered Medications:  .  0.9 %  sodium chloride infusion, 10 mL/hr, Intravenous, Once, Carrie Mew, MD, Stopped at 03/29/17 0002 .  acetaminophen (TYLENOL) tablet 650 mg, 650 mg, Oral, Q4H PRN **OR** acetaminophen (TYLENOL) solution 650 mg, 650 mg, Per Tube, Q4H PRN **OR** acetaminophen (TYLENOL) suppository 650 mg, 650 mg, Rectal, Q4H PRN, Lance Coon, MD .  atorvastatin (LIPITOR) tablet 20 mg, 20 mg, Oral, q1800, Nettie Elm, MD .  chlorhexidine (PERIDEX) 0.12 % solution 15 mL, 15 mL, Mouth Rinse, BID, Mortimer Fries, Kurian, MD, 15 mL at 03/31/17 2215 .  dexmedetomidine (PRECEDEX) 400 MCG/100ML (4 mcg/mL) infusion, 0.4-1.2 mcg/kg/hr, Intravenous, Titrated, Tukov, Magadalene S, NP, Last Rate: 6.4 mL/hr at 04/01/17 0200, 0.4 mcg/kg/hr at 04/01/17 0200 .  donepezil (ARICEPT) tablet 10 mg, 10 mg, Oral, QHS, Nettie Elm, MD, 10 mg at 03/31/17 2213 .  hydrALAZINE (APRESOLINE) injection 10-40 mg, 10-40 mg, Intravenous, Q4H PRN, Nettie Elm, MD, 20 mg at 03/30/17 2029 .  hydrochlorothiazide (HYDRODIURIL) tablet 25 mg, 25 mg, Oral, Daily, Nettie Elm, MD, 25 mg at 03/31/17 7989 .  latanoprost (XALATAN) 0.005 % ophthalmic solution 1 drop, 1 drop, Both Eyes, QHS, Nettie Elm, MD, 1 drop at 03/31/17 2212 .  letrozole [facility]) tablet 2.5 mg, 2.5 mg, Oral, Daily, Nettie Elm, MD, 2.5 mg at 03/31/17 2119 .  levETIRAcetam (KEPPRA) 500 mg in sodium chloride 0.9  % 100 mL IVPB, 500 mg, Intravenous, Q12H, Carrie Mew, MD, Stopped at 03/31/17 2235 .  levothyroxine (SYNTHROID, LEVOTHROID) tablet 75 mcg, 75 mcg, Oral, QAC breakfast, Nettie Elm, MD, 75 mcg at 03/31/17 [phone] .  lisinopril (PRINIVIL,ZESTRIL) tablet 5 mg, 5 mg, Oral, Daily, Nettie Elm, MD, 5 mg at 03/31/17 0814 .  MEDLINE mouth rinse, 15 mL, Mouth Rinse, q12n4p, Kasa, Kurian, MD, 15 mL at 03/31/17 1645 .  memantine [facility]) tablet 5 mg, 5 mg, Oral, BID, Nettie Elm, MD, 5 mg at 03/31/17 2213 .  metoprolol tartrate (LOPRESSOR) tablet 25 mg, 25 mg, Oral, BID, Nettie Elm, MD, 25 mg at 03/31/17 2211 .  morphine 2 MG/ML injection 2-4 mg, 2-4 mg, Intravenous, Q3H PRN, Patria Mane, Magadalene S, NP .  nicardipine (CARDENE) 20mg  in 0.86% saline 246ml IV infusion (0.1 mg/ml), 0-15 mg/hr, Intravenous, Continuous, Nettie Elm, MD, Last Rate: 25 mL/hr at 04/01/17 0200, 2.5 mg/hr at 04/01/17 0200 .  pantoprazole (PROTONIX) injection 40 mg, 40 mg, Intravenous, Corwin Levins, MD, 40 mg at 03/31/17 2211 .  potassium chloride (KLOR-CON) packet 60 mEq, 60 mEq, Oral, Once, Tukov, Magadalene S, NP .  potassium chloride 10 mEq in 100 mL IVPB, 10 mEq, Intravenous, Q1 Hr x 2, Tukov, Magadalene S, NP  Facility-Administered Medications Ordered in Other Encounters:  .  sodium chloride flush (NS) 0.9 % injection 10 mL, 10 mL, Intravenous, PRN, Mike Gip, Melissa C, MD, 10 mL at 01/11/17 1029  REVIEW OF SYSTEMS  VS: BP 127/77 (BP Location: Left Arm)   Pulse 70   Temp 98.6 F (37 C) (Axillary)   Resp 16   Ht 5\' 4"  (1.626 m)   Wt 63.6 kg (140 lb 3.4 oz)   SpO2 95%   BMI 24.07 kg/m      PHYSICAL EXAM   Awake. Moves extremities spontaneously. Occasionally follows commands.  Squeezed my fingers with both her hands.  Was able to lift both her arms upon command. CVS: AP regular, S1, S2, no mrg  Chest is clear to auscultation bilaterally with no rales rhonchi or wheezes. Abdomen is soft, nontender,  nondistended.  Bowel sounds are positive. No lower extremity edema.  LABS    Recent Labs    03/29/17 1337 03/30/17 0539 03/31/17 0351 04/01/17 0459  BUN 19 18 28* 24*  CREATININE 0.68 0.60 0.58 0.65  GLUCOSE 128* 128* 147* 136*  CALCIUM 8.4* 8.7* 8.6* 8.6*  ,    No results for input(s): PH in the last 72 hours.  Invalid input(s): PCO2, PO2, BASEEXCESS, BASEDEFICITE, TFT    CULTURE RESULTS   Recent Results (from the past 240 hour(s))  Urine culture     Status: None   Collection Time: 03/28/17  8:18 PM  Result Value Ref Range Status   Specimen Description URINE, CATHETERIZED  Final   Special Requests NONE  Final   Culture   Final    NO GROWTH Performed at [facility], [address] [place], [place] [address]    Report Status 03/30/2017 FINAL  Final  MRSA PCR Screening     Status: None   Collection Time: 03/29/17  2:42 AM  Result Value Ref Range Status   MRSA by PCR NEGATIVE NEGATIVE Final    Comment:        The GeneXpert MRSA Assay (FDA approved for NASAL specimens only), is one component of a comprehensive MRSA colonization surveillance program. It is not intended to diagnose MRSA infection nor to guide or monitor treatment for MRSA infections.           IMAGING    No results found.       ASSESSMENT/PLAN   83 years old lady with a past medical history significant for breast cancer status post mastectomy and chemotherapy radiation, hypertension, renal carcinoma, thyroid disorder, left MCA infarct who has been admitted to the hospital with a traumatic left frontal ICH.  Problem list Traumatic left frontal ICH with a 0.7 cm left to right shift initially that is now reduced to 0.4 cm Uncontrolled hypertension Hypokalemia History of left MCA infarct History of renal carcinoma History of breast cancer status post mastectomy, chemotherapy and radiation Left lung nodules-enlarging  Plan Continue neurochecks q. one hours. Patient is on  nicardipine drip.  Target systolic blood pressure less than 140.  Titrate nicardipine drip to achieve target systolic blood pressures. The patient still requires nicardipine drip.  Given her relative bradycardia, I cannot increase the metoprolol dose. Continue metoprolol to 25 mg twice daily p.o. Increase HCTZ to 5 mg daily Start lisinopril 5 mg daily Continue Keppra twice daily As needed hydralazine. Continue holding home aspirin MRI/MRA results reviewed with neurology and neurosurgery.  Neurosurgery's note is in chart.  Continue medical management.  No surgical intervention. Echo cardiogram shows an EF of 65% and elevated pulmonary pressures  CT chest performed 03/30/17 independently reviewed.  Report noted as well. I compared to the CT scan of the chest performed on 06/08/16, 12/07/15 and 06/09/15. The right  upper lobe medial opacity is a likely radiation fibrosis and scarring and has remained unchanged in size over the last few successive CT scans. The left apical ground glass nodule measures 6 x 5 mm and has remained stable compared to CT chest performed on 06/08/16.  On the CT scan performed on 12/07/15 and 06/09/15 it was noted to be larger in size. Similarly, the LUL solid nodule has remained stable when compared to 06/08/16 but was larger in size on the CT chest performed on 06/09/15. For now, given the patient's ongoing issues, nothing needs to be done for these lung nodules.  However, once the patient's acute issues resolve in her condition improves, given her history of breast CA, further workup including PET/CT to assess for FDG uptake can be considered.  Decision to proceed with further workup should be decided by the team taking care of her at that time based on the patient's condition prior to discharge from the hospital.  Replace potassium IV.  Patient has passed a swallow evaluation and is getting p.o. medications with pured diet.  Continue home Aricept and Namenda Continue home  Lipitor Continue home Synthroid Continue home Xalatan  PT / OT/Speech therapy  Protonix for stress ulcer prophylaxis SCDs for DVT prophylaxis  DNR/DNI  Continue ICU care until the patient comes off nicardipine drip   Critical care time spent was 45 minutes  Magdalene S. [facility] ANP-BC Pulmonary and Critical Care Medicine [facility] Pager [phone] or [phone]

## 2017-04-01 NOTE — Plan of Care (Signed)
Patient slept off and on this shift.  Frequent attempts to get out of bed.  Redirection needed.  Tolerating meds well.  Bed alarm on. No acute distress noted.

## 2017-04-01 NOTE — Progress Notes (Signed)
[facility] NAME: Alexandra Glass    MR#:  [id number]  DATE OF BIRTH:  [birth year]  Seen today, calm and sleeping during my visit but nurses she follows some commands and also taking pills by mouth  CHIEF COMPLAINT:   Chief Complaint  Patient presents with  . Fall    REVIEW OF SYSTEMS:    Review of Systems  Unable to perform ROS: Mental acuity  Patient unable to give review of systems because she is having episodes of altered mental status.  Nutrition:  Tolerating Diet: Tolerating PT:      DRUG ALLERGIES:   Allergies  Allergen Reactions  . No Known Allergies     VITALS:  Blood pressure 140/76, pulse 62, temperature 98.6 F (37 C), temperature source Axillary, resp. rate 13, height 5\' 4"  (1.626 m), weight 63.6 kg (140 lb 3.4 oz), SpO2 95 %.  PHYSICAL EXAMINATION:   Physical Exam  GENERAL:  83 y.o.-year-old patient lying in the bed with no acute distress.  Patient has vitiligo. EYES: Pupils equal, round, reactive to light and accommodation. No scleral icterus. Extraocular muscles intact.  HEENT: Head atraumatic, normocephalic. Oropharynx and nasopharynx clear.  NECK:  Supple, no jugular venous distention. No thyroid enlargement, no tenderness.  LUNGS: Normal breath sounds bilaterally, no wheezing, rales,rhonchi or crepitation. No use of accessory muscles of respiration.  CARDIOVASCULAR: S1, S2 normal. No murmurs, rubs, or gallops.  ABDOMEN: Soft, nontender, nondistended. Bowel sounds present. No organomegaly or mass.  EXTREMITIES: No edema. Psych;unable to follow commands because of intracranial hemorrhage,  LABORATORY PANEL:   CBC Recent Labs  Lab 04/01/17 0720  WBC 8.5  HGB 12.7  HCT 38.0  PLT 201   ------------------------------------------------------------------------------------------------------------------  Chemistries  Recent Labs  Lab 03/28/17 2200  04/01/17 0459 04/01/17 0720  NA 140    < > 139  --   K 3.3*   < > 3.0*  --   CL 109   < > 110  --   CO2 23   < > 21*  --   GLUCOSE 148*   < > 136*  --   BUN 23*   < > 24*  --   CREATININE 0.82   < > 0.65  --   CALCIUM 8.8*   < > 8.6*  --   MG  --    < >  --  2.2  AST 26  --   --   --   ALT 13*  --   --   --   ALKPHOS 49  --   --   --   BILITOT 0.7  --   --   --    < > = values in this interval not displayed.   ------------------------------------------------------------------------------------------------------------------  Cardiac Enzymes No results for input(s): TROPONINI in the last 168 hours. ------------------------------------------------------------------------------------------------------------------  RADIOLOGY:  Ct Chest Wo Contrast  Result Date: 03/30/2017 CLINICAL DATA:  Followup lung nodule. EXAM: CT CHEST WITHOUT CONTRAST TECHNIQUE: Multidetector CT imaging of the chest was performed following the standard protocol without IV contrast. COMPARISON:  06/08/2016 FINDINGS: Cardiovascular: Moderate cardiac enlargement. There is aortic atherosclerosis noted. Calcifications within the LAD and left circumflex and RCA coronary artery is noted. No pericardial effusion identified. Stable appearance of the thoracic aorta. The descending thoracic aorta is increased in caliber measuring 4.1 cm. Mediastinum/Nodes: The trachea appears patent and is midline. Normal appearance of the esophagus. 9 mm right paratracheal lymph node is unchanged from  previous exam. Within the limitations of unenhanced technique there is no hilar adenopathy. No enlarged supraclavicular or axillary nodes identified. Lungs/Pleura: Moderate changes of emphysema. Masslike architectural distortion within the medial aspect of the right upper lobe measures 2.2 by 1.3 by 4.3 cm, image 58 of series 5 and image 37 of series 2. On the previous exam this measured 2.3 x 1.6 by 4.3 cm. Index spiculated nodule within the left upper lobe measures 6 x 5 mm, image 40 of  series 3. Unchanged from previous exam. Ground-glass attenuating nodule within the anterolateral left apex is stable measuring 7 mm, image 22 of series 3. Upper Abdomen: Unchanged left lobe of liver cyst measuring 1.9 cm, image 113 of series 2. No acute abnormality noted within the upper abdomen. Unchanged partially calcified lesion arising from the upper pole of left kidney. Incompletely characterized without IV contrast. Musculoskeletal: No aggressive lytic or sclerotic bone lesions identified. IMPRESSION: 1. Stable appearance of bilateral pulmonary lesions including masslike architectural distortion within the medial right upper lobe, ground-glass attenuating nodule in the left apex and spiculated nodule in the left upper lobe. 2. Aortic Atherosclerosis (ICD10-I70.0) and Emphysema (ICD10-J43.9). 3. Stable aneurysmal dilatation of the descending thoracic aorta. Electronically Signed   By: Kerby Moors M.D.   On: 03/30/2017 15:29   Mr Jodene Nam Head Wo Contrast  Result Date: 03/30/2017 CLINICAL DATA:  Patient found unresponsive after a fall. Past history of breast and lung cancer. EXAM: MRI HEAD WITHOUT CONTRAST MRA HEAD WITHOUT CONTRAST MRA NECK WITHOUT CONTRAST TECHNIQUE: Multiplanar, multiecho pulse sequences of the brain and surrounding structures were obtained without intravenous contrast. Angiographic images of the Circle of Willis were obtained using MRA technique without intravenous contrast. Angiographic images of the neck were obtained using MRA technique without intravenous contrast. Carotid stenosis measurements (when applicable) are obtained utilizing NASCET criteria, using the distal internal carotid diameter as the denominator. COMPARISON:  CT head 03/28/2017.  MR head 09/28/2015. FINDINGS: MRI HEAD FINDINGS Brain: Large intraparenchymal hemorrhage LEFT frontal lobe, with partial retraction of the clot. Predominant signal characteristic is T2 shortening representing Deoxyhemoglobin, although there  is some T1 shortening consistent with conversion to methemoglobin. Cross-sectional measurements of 18 x 45 x 49 mm, corresponding to a volume of approximately 20 mL. LEFT-to-RIGHT anterior frontal lobe shift under the falx. LEFT-to-RIGHT shift at the level of the septum pellucidum is approximately 4 mm. Mild surrounding edema. Artifactual bright signal on diffusion-weighted imaging, due to blood products. Extreme atrophy. Chronic microvascular ischemic change of an advanced nature. 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 Chronic bland infarcts of the LEFT frontal and temporal lobes, which were acute in 2017. No contrast was given, although the internal architecture of the bleed does not suggest an underlying mass lesion. Vascular: Reported separately. Skull and upper cervical spine: Limited visualization. No marrow destructive process. Sinuses/Orbits: Negative. Other: None. MRA HEAD FINDINGS Motion degraded exam. There is patency of the cervical internal carotid arteries throughout their upper cervical and petrous segments. Signal loss in the BILATERAL cavernous ICA segments appears to be related to motion degradation rather than discrete lesion. BILATERAL ICA termini widely patent. There is no RIGHT or LEFT flow-limiting stenosis of the M1 MCA segments. BILATERAL anterior cerebral arteries are patent, with LEFT-to-RIGHT shift of the A3 segments. Artifactual signal loss of the BILATERAL MCA bifurcations. Basilar artery is patent with mild irregularity proximally. The RIGHT vertebral is the dominant contributor to the basilar, with irregularity in the V4 segment. The LEFT vertebral is non  dominant, with a severe stenosis at its junction with the basilar. Mild irregularity of both PCA segments, non flow reducing. BILATERAL superior cerebellar arteries are unremarkable. Poorly visualized anterior inferior and posterior inferior cerebellar arteries. No enlarged anterior circulation  vessels to suggest arteriovenous malformation. No evidence for saccular aneurysm, although BILATERAL MCA bifurcations are poorly visualized due to artifactual signal loss. MRA NECK FINDINGS Noncontrast examination was performed. This examination is targeted to the carotid bifurcations and does not include the arch. The visualized portions of the common carotid arteries are widely patent. There is no flow-limiting stenosis, or dissection involving the carotid bifurcations. Proximal internal carotid artery segments are widely patent. There is no dissection, fibromuscular change, or pseudoaneurysm involving the cervical ICA segments. Both vertebral arteries are widely patent throughout their neck to the skullbase, with the RIGHT dominant. IMPRESSION: Large intraparenchymal hemorrhage in the LEFT frontal lobe is redemonstrated, corresponding to a late acute/ early subacute time course. No specific etiology is identified on this noncontrast exam, but considerations include a lobar hypertensive bleed, hemorrhage due to cerebral amyloid angiopathy, or a posttraumatic intracerebral hemorrhage. Embolic infarct with reperfusion, arteriovenous malformation, or hemorrhagic metastasis, are less likely. The lesion has decompressed somewhat into the ventricular system, with cross-sectional measurements of the hematoma cavity corresponding to approximate 20 mL volume. 4 mm of LEFT-to-RIGHT shift. Extreme atrophy and small vessel disease. Motion degraded intracranial MRA demonstrates no flow-limiting stenosis or enlarged feeding vessels. Extracranial MRA demonstrates no flow-limiting stenosis in the anterior circulation. There is a severe stenosis of the non dominant distal vertebral artery at its junction with the basilar. Electronically Signed   By: Staci Righter M.D.   On: 03/30/2017 14:14   Mr Jodene Nam Neck Wo Contrast  Result Date: 03/30/2017 CLINICAL DATA:  Patient found unresponsive after a fall. Past history of breast and  lung cancer. EXAM: MRI HEAD WITHOUT CONTRAST MRA HEAD WITHOUT CONTRAST MRA NECK WITHOUT CONTRAST TECHNIQUE: Multiplanar, multiecho pulse sequences of the brain and surrounding structures were obtained without intravenous contrast. Angiographic images of the Circle of Willis were obtained using MRA technique without intravenous contrast. Angiographic images of the neck were obtained using MRA technique without intravenous contrast. Carotid stenosis measurements (when applicable) are obtained utilizing NASCET criteria, using the distal internal carotid diameter as the denominator. COMPARISON:  CT head 03/28/2017.  MR head 09/28/2015. FINDINGS: MRI HEAD FINDINGS Brain: Large intraparenchymal hemorrhage LEFT frontal lobe, with partial retraction of the clot. Predominant signal characteristic is T2 shortening representing Deoxyhemoglobin, although there is some T1 shortening consistent with conversion to methemoglobin. Cross-sectional measurements of 18 x 45 x 49 mm, corresponding to a volume of approximately 20 mL. LEFT-to-RIGHT anterior frontal lobe shift under the falx. LEFT-to-RIGHT shift at the level of the septum pellucidum is approximately 4 mm. Mild surrounding edema. Artifactual bright signal on diffusion-weighted imaging, due to blood products. Extreme atrophy. Chronic microvascular ischemic change of an advanced nature. 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 Chronic bland infarcts of the LEFT frontal and temporal lobes, which were acute in 2017. No contrast was given, although the internal architecture of the bleed does not suggest an underlying mass lesion. Vascular: Reported separately. Skull and upper cervical spine: Limited visualization. No marrow destructive process. Sinuses/Orbits: Negative. Other: None. MRA HEAD FINDINGS Motion degraded exam. There is patency of the cervical internal carotid arteries throughout their upper cervical and petrous segments. Signal loss in  the BILATERAL cavernous ICA segments appears to be related to motion degradation rather than discrete lesion.  BILATERAL ICA termini widely patent. There is no RIGHT or LEFT flow-limiting stenosis of the M1 MCA segments. BILATERAL anterior cerebral arteries are patent, with LEFT-to-RIGHT shift of the A3 segments. Artifactual signal loss of the BILATERAL MCA bifurcations. Basilar artery is patent with mild irregularity proximally. The RIGHT vertebral is the dominant contributor to the basilar, with irregularity in the V4 segment. The LEFT vertebral is non dominant, with a severe stenosis at its junction with the basilar. Mild irregularity of both PCA segments, non flow reducing. BILATERAL superior cerebellar arteries are unremarkable. Poorly visualized anterior inferior and posterior inferior cerebellar arteries. No enlarged anterior circulation vessels to suggest arteriovenous malformation. No evidence for saccular aneurysm, although BILATERAL MCA bifurcations are poorly visualized due to artifactual signal loss. MRA NECK FINDINGS Noncontrast examination was performed. This examination is targeted to the carotid bifurcations and does not include the arch. The visualized portions of the common carotid arteries are widely patent. There is no flow-limiting stenosis, or dissection involving the carotid bifurcations. Proximal internal carotid artery segments are widely patent. There is no dissection, fibromuscular change, or pseudoaneurysm involving the cervical ICA segments. Both vertebral arteries are widely patent throughout their neck to the skullbase, with the RIGHT dominant. IMPRESSION: Large intraparenchymal hemorrhage in the LEFT frontal lobe is redemonstrated, corresponding to a late acute/ early subacute time course. No specific etiology is identified on this noncontrast exam, but considerations include a lobar hypertensive bleed, hemorrhage due to cerebral amyloid angiopathy, or a posttraumatic intracerebral  hemorrhage. Embolic infarct with reperfusion, arteriovenous malformation, or hemorrhagic metastasis, are less likely. The lesion has decompressed somewhat into the ventricular system, with cross-sectional measurements of the hematoma cavity corresponding to approximate 20 mL volume. 4 mm of LEFT-to-RIGHT shift. Extreme atrophy and small vessel disease. Motion degraded intracranial MRA demonstrates no flow-limiting stenosis or enlarged feeding vessels. Extracranial MRA demonstrates no flow-limiting stenosis in the anterior circulation. There is a severe stenosis of the non dominant distal vertebral artery at its junction with the basilar. Electronically Signed   By: Staci Righter M.D.   On: 03/30/2017 14:14   Mr Brain Wo Contrast  Result Date: 03/30/2017 CLINICAL DATA:  Patient found unresponsive after a fall. Past history of breast and lung cancer. EXAM: MRI HEAD WITHOUT CONTRAST MRA HEAD WITHOUT CONTRAST MRA NECK WITHOUT CONTRAST TECHNIQUE: Multiplanar, multiecho pulse sequences of the brain and surrounding structures were obtained without intravenous contrast. Angiographic images of the Circle of Willis were obtained using MRA technique without intravenous contrast. Angiographic images of the neck were obtained using MRA technique without intravenous contrast. Carotid stenosis measurements (when applicable) are obtained utilizing NASCET criteria, using the distal internal carotid diameter as the denominator. COMPARISON:  CT head 03/28/2017.  MR head 09/28/2015. FINDINGS: MRI HEAD FINDINGS Brain: Large intraparenchymal hemorrhage LEFT frontal lobe, with partial retraction of the clot. Predominant signal characteristic is T2 shortening representing Deoxyhemoglobin, although there is some T1 shortening consistent with conversion to methemoglobin. Cross-sectional measurements of 18 x 45 x 49 mm, corresponding to a volume of approximately 20 mL. LEFT-to-RIGHT anterior frontal lobe shift under the falx.  LEFT-to-RIGHT shift at the level of the septum pellucidum is approximately 4 mm. Mild surrounding edema. Artifactual bright signal on diffusion-weighted imaging, due to blood products. Extreme atrophy. Chronic microvascular ischemic change of an advanced nature. 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 Chronic bland infarcts of the LEFT frontal and temporal lobes, which were acute in 2017. No contrast was given, although the internal architecture of the bleed  does not suggest an underlying mass lesion. Vascular: Reported separately. Skull and upper cervical spine: Limited visualization. No marrow destructive process. Sinuses/Orbits: Negative. Other: None. MRA HEAD FINDINGS Motion degraded exam. There is patency of the cervical internal carotid arteries throughout their upper cervical and petrous segments. Signal loss in the BILATERAL cavernous ICA segments appears to be related to motion degradation rather than discrete lesion. BILATERAL ICA termini widely patent. There is no RIGHT or LEFT flow-limiting stenosis of the M1 MCA segments. BILATERAL anterior cerebral arteries are patent, with LEFT-to-RIGHT shift of the A3 segments. Artifactual signal loss of the BILATERAL MCA bifurcations. Basilar artery is patent with mild irregularity proximally. The RIGHT vertebral is the dominant contributor to the basilar, with irregularity in the V4 segment. The LEFT vertebral is non dominant, with a severe stenosis at its junction with the basilar. Mild irregularity of both PCA segments, non flow reducing. BILATERAL superior cerebellar arteries are unremarkable. Poorly visualized anterior inferior and posterior inferior cerebellar arteries. No enlarged anterior circulation vessels to suggest arteriovenous malformation. No evidence for saccular aneurysm, although BILATERAL MCA bifurcations are poorly visualized due to artifactual signal loss. MRA NECK FINDINGS Noncontrast examination was performed.  This examination is targeted to the carotid bifurcations and does not include the arch. The visualized portions of the common carotid arteries are widely patent. There is no flow-limiting stenosis, or dissection involving the carotid bifurcations. Proximal internal carotid artery segments are widely patent. There is no dissection, fibromuscular change, or pseudoaneurysm involving the cervical ICA segments. Both vertebral arteries are widely patent throughout their neck to the skullbase, with the RIGHT dominant. IMPRESSION: Large intraparenchymal hemorrhage in the LEFT frontal lobe is redemonstrated, corresponding to a late acute/ early subacute time course. No specific etiology is identified on this noncontrast exam, but considerations include a lobar hypertensive bleed, hemorrhage due to cerebral amyloid angiopathy, or a posttraumatic intracerebral hemorrhage. Embolic infarct with reperfusion, arteriovenous malformation, or hemorrhagic metastasis, are less likely. The lesion has decompressed somewhat into the ventricular system, with cross-sectional measurements of the hematoma cavity corresponding to approximate 20 mL volume. 4 mm of LEFT-to-RIGHT shift. Extreme atrophy and small vessel disease. Motion degraded intracranial MRA demonstrates no flow-limiting stenosis or enlarged feeding vessels. Extracranial MRA demonstrates no flow-limiting stenosis in the anterior circulation. There is a severe stenosis of the non dominant distal vertebral artery at its junction with the basilar. Electronically Signed   By: Staci Righter M.D.   On: 03/30/2017 14:14     ASSESSMENT AND PLAN:   Principal Problem:   Intracranial hemorrhage (HCC) Active Problems:   Chronic dementia without behavioral disturbance   Essential hypertension   Cerebral vascular disease   Hypothyroidism   Dysphagia   Aphasia   Palliative care by specialist   Goals of care, counseling/discussion   #1.large intracranial hemorrhage in the  left frontal area and MRI of the brain confirmed .  Patient has mild midline shift from left to right.   Patient EEG showed significant for slowing.  She is on Vimpat 100 mg every 12 hours as per neurology, stopped on Keppra.  Nicardipine drip for BP control and the goal is to keep systolic blood pellets of 140. Started on dysphagia 1 diet.  2.  Large intracranial hemorrhage: Dr. Cari Caraway and Dr. Doy Mince help is appreciated.  Patient is on statins. 3.  CODE STATUS DNR.  4.hypothyroid rhythm: Continue Synthyroid. 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 Management plans  discussed with the patient, family and they are in agreement.  CODE STATUS:DNR  TOTAL TIME TAKING CARE OF THIS PATIENT: 35 minutes.   POSSIBLE D/C IN 3-4DAYS, DEPENDING ON CLINICAL CONDITION.   Epifanio Lesches M.D on 04/01/2017 at 1:06 PM  Between 7am to 6pm - Pager - [phone]  After 6pm go to [email or web address] - password [email or web address]  Office  [phone]  CC: Primary care physician; Juline Patch, MD

## 2017-04-02 ENCOUNTER — Encounter: Payer: Self-pay | Admitting: Hematology and Oncology

## 2017-04-02 DIAGNOSIS — I10 Essential (primary) hypertension: Secondary | ICD-10-CM

## 2017-04-02 DIAGNOSIS — I629 Nontraumatic intracranial hemorrhage, unspecified: Secondary | ICD-10-CM

## 2017-04-02 LAB — MAGNESIUM: MAGNESIUM: 2.3 mg/dL (ref 1.7–2.4)

## 2017-04-02 LAB — GLUCOSE, CAPILLARY
GLUCOSE-CAPILLARY: 122 mg/dL — AB (ref 65–99)
GLUCOSE-CAPILLARY: 123 mg/dL — AB (ref 65–99)
Glucose-Capillary: 121 mg/dL — ABNORMAL HIGH (ref 65–99)

## 2017-04-02 LAB — BASIC METABOLIC PANEL
ANION GAP: 10 (ref 5–15)
BUN: 28 mg/dL — ABNORMAL HIGH (ref 6–20)
CHLORIDE: 111 mmol/L (ref 101–111)
CO2: 21 mmol/L — AB (ref 22–32)
CREATININE: 0.66 mg/dL (ref 0.44–1.00)
Calcium: 8.7 mg/dL — ABNORMAL LOW (ref 8.9–10.3)
GFR calc non Af Amer: >60 mL/min (ref >60)
Glucose, Bld: 134 mg/dL — ABNORMAL HIGH (ref 65–99)
Potassium: 3.1 mmol/L — ABNORMAL LOW (ref 3.5–5.1)
SODIUM: 142 mmol/L (ref 135–145)

## 2017-04-02 LAB — PHOSPHORUS: PHOSPHORUS: 2.6 mg/dL (ref 2.5–4.6)

## 2017-04-02 MED ORDER — POTASSIUM CHLORIDE 10 MEQ/100ML IV SOLN
10.0000 meq | INTRAVENOUS | Status: AC
  Administered 2017-04-02 (×4): 10 meq via INTRAVENOUS
  Filled 2017-04-02 (×4): qty 100

## 2017-04-02 MED ORDER — METOPROLOL TARTRATE 25 MG PO TABS
37.5000 mg | ORAL_TABLET | Freq: Two times a day (BID) | ORAL | Status: DC
  Administered 2017-04-02 – 2017-04-05 (×6): 37.5 mg via ORAL
  Filled 2017-04-02 (×7): qty 2

## 2017-04-02 MED ORDER — LISINOPRIL 20 MG PO TABS
20.0000 mg | ORAL_TABLET | Freq: Every day | ORAL | Status: DC
  Administered 2017-04-02 – 2017-04-05 (×4): 20 mg via ORAL
  Filled 2017-04-02 (×4): qty 1

## 2017-04-02 MED ORDER — POTASSIUM CHLORIDE 2 MEQ/ML IV SOLN
INTRAVENOUS | Status: DC
  Administered 2017-04-02 – 2017-04-04 (×4): via INTRAVENOUS
  Filled 2017-04-02 (×4): qty 1000

## 2017-04-02 MED ORDER — POTASSIUM CHLORIDE 20 MEQ PO PACK
60.0000 meq | PACK | Freq: Once | ORAL | Status: AC
  Administered 2017-04-02: 60 meq via ORAL
  Filled 2017-04-02: qty 3

## 2017-04-02 NOTE — Progress Notes (Addendum)
[facility] [place] NAME: Alexandra Glass    MR#:  [id number]  DATE OF BIRTH:  06/19/1924  Opens eyes today but confused.  admitted for intracranial hemorrhage, monitored in ICU because she needed nicardipine drip for BP control.   She  is off the nicardipine drip today today.  Getting transferred out of ICU.  CHIEF COMPLAINT:   Chief Complaint  Patient presents with  . Fall    REVIEW OF SYSTEMS:    Review of Systems  Unable to perform ROS: Mental acuity  Patient unable to give review of systems because she is having episodes of altered mental status.  Nutrition:  Tolerating Diet: Tolerating PT:      DRUG ALLERGIES:   Allergies  Allergen Reactions  . No Known Allergies     VITALS:  Blood pressure (!) 153/93, pulse 78, temperature (!) 97.5 F (36.4 C), temperature source Axillary, resp. rate 19, height 5\' 4"  (1.626 m), weight 63.6 kg (140 lb 3.4 oz), SpO2 96 %.  PHYSICAL EXAMINATION:   Physical Exam  GENERAL:  83 y.o.-year-old patient lying in the bed with no acute distress.  Patient has vitiligo. EYES: Pupils equal, round, reactive to light. No scleral icterus. Extraocular muscles intact.  HEENT: Head atraumatic, normocephalic. Oropharynx and nasopharynx clear.  NECK:  Supple, no jugular venous distention. No thyroid enlargement, no tenderness.  LUNGS: Normal breath sounds bilaterally, no wheezing, rales,rhonchi or crepitation. No use of accessory muscles of respiration.  CARDIOVASCULAR: S1, S2 normal. No murmurs, rubs, or gallops.  ABDOMEN: Soft, nontender, nondistended. Bowel sounds present. No organomegaly or mass.  EXTREMITIES: No edema. Psych;unable to follow commands because of intracranial hemorrhage,  LABORATORY PANEL:   CBC Recent Labs  Lab 04/01/17 0720  WBC 8.5  HGB 12.7  HCT 38.0  PLT 201    ------------------------------------------------------------------------------------------------------------------  Chemistries  Recent Labs  Lab 03/28/17 2200  04/02/17 0420  NA 140   < > 142  K 3.3*   < > 3.1*  CL 109   < > 111  CO2 23   < > 21*  GLUCOSE 148*   < > 134*  BUN 23*   < > 28*  CREATININE 0.82   < > 0.66  CALCIUM 8.8*   < > 8.7*  MG  --    < > 2.3  AST 26  --   --   ALT 13*  --   --   ALKPHOS 49  --   --   BILITOT 0.7  --   --    < > = values in this interval not displayed.   ------------------------------------------------------------------------------------------------------------------  Cardiac Enzymes No results for input(s): TROPONINI in the last 168 hours. ------------------------------------------------------------------------------------------------------------------  RADIOLOGY:  No results found.   ASSESSMENT AND PLAN:   Principal Problem:   Intracranial hemorrhage (HCC) Active Problems:   Chronic dementia without behavioral disturbance   Essential hypertension   Cerebral vascular disease   Hypothyroidism   Dysphagia   Aphasia   Palliative care by specialist   Goals of care, counseling/discussion   #1.large intracranial hemorrhage in the left frontal area and MRI of the brain confirmed .  Patient has mild midline shift from left to right.  Weaned off nicardipine drip, continuing p.o. metoprolol HCTZ,, continuing Ringer lactate at 50 mL an hour that is started today. Patient EEG showed significant for slowing.  She is on Vimpat 100 mg every 12 hours as per neurology, stopped on Keppra.  Started on dysphagia 1 diet.  2.  Large intracranial hemorrhage: Dr. Cari Caraway and Dr. Doy Mince help is appreciated.  Patient is on statins. 3.  CODE STATUS DNR.  4.hypothyroid rhythm: Continue Synthyroid. 5.possible radiation fibrosisin lung; Hypokalemia: Getting potassium replacement.  All the records are reviewed and case discussed with Care  Management/Social Workerr. Management plans discussed with the patient, family and they are in agreement.  CODE STATUS:DNR  TOTAL TIME TAKING CARE OF THIS PATIENT: 35 minutes.   POSSIBLE D/C IN 3-4DAYS, DEPENDING ON CLINICAL CONDITION.   Epifanio Lesches M.D on 04/02/2017 at 11:33 AM  Between 7am to 6pm - Pager - [phone]  After 6pm go to [email or web address] - password [email or web address] [facility]  Office  [phone]  CC: Primary care physician; Juline Patch, MD

## 2017-04-02 NOTE — NC FL2 (Signed)
[place] LEVEL OF CARE SCREENING TOOL     IDENTIFICATION  Patient Name: Alexandra Glass Birthdate: 1924/08/22 Sex: female Admission Date (Current Location): 03/28/2017  [place] and [place] Number:  Engineering geologist and Address:  [facility], [address] [place], [place] [address]      Provider Number: [id number]  Attending Physician Name and Address:  Epifanio Lesches, MD  Relative Name and Phone Number:  Hayleigh Bawa (son) [phone]    Current Level of Care: Hospital Recommended Level of Care: [place] Prior Approval Number:    Date Approved/Denied:   PASRR Number: [id number]  Discharge Plan: SNF    Current Diagnoses: Patient Active Problem List   Diagnosis Date Noted  . Dysphagia   . Aphasia   . Palliative care by specialist   . Goals of care, counseling/discussion   . Intracranial hemorrhage ([place]) 03/29/2017  . Microcytic red blood cells 03/08/2017  . Hypothyroidism 03/28/2016  . Osteopenia 03/21/2016  . Hypokalemia 12/09/2015  . 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 History of stroke 11/03/2015  . Essential hypertension 10/06/2015  . Cerebral vascular disease 10/06/2015  . TIA (transient ischemic attack) 09/27/2015  . Accelerated hypertension 09/27/2015  . Chronic dementia without behavioral disturbance 09/27/2015  . B12 deficiency 03/03/2015  . Amnesia 11/27/2014  . Breast cancer, right breast ([place]) 11/12/2014  . Cancer of upper lobe of right lung ([place]) 11/12/2014  . Bilateral kidney masses 11/12/2014  . Hyperlipidemia 10/09/2014    Orientation RESPIRATION BLADDER Height & Weight     Self, Place  Normal Incontinent Weight: 140 lb 3.4 oz (63.6 kg) Height:  5\' 4"  (162.6 cm)  BEHAVIORAL SYMPTOMS/MOOD NEUROLOGICAL BOWEL NUTRITION STATUS    Convulsions/Seizures Incontinent Diet(Dysphagia 1 (puree); Nectar Thick Liquids)  AMBULATORY STATUS COMMUNICATION OF NEEDS Skin   Extensive  Assist Verbally Normal                       Personal Care Assistance Level of Assistance  Bathing, Feeding, Dressing Bathing Assistance: Maximum assistance Feeding assistance: Limited assistance Dressing Assistance: Maximum assistance     Functional Limitations Info             SPECIAL CARE FACTORS FREQUENCY  PT (By licensed PT), OT (By licensed OT)     PT Frequency: Up to 5X per day, 5 days per week OT Frequency: Up to 5X per day, 5 days per week            Contractures Contractures Info: Not present    Additional Factors Info  Code Status, Allergies Code Status Info: DNR Allergies Info: No known allergies           Current Medications (04/02/2017):  This is the current hospital active medication list Current Facility-Administered Medications  Medication Dose Route Frequency Provider Last Rate Last Dose  . 0.9 %  sodium chloride infusion  10 mL/hr Intravenous Once Carrie Mew, MD   Stopped at 03/29/17 0002  . acetaminophen (TYLENOL) tablet 650 mg  650 mg Oral Q4H PRN Lance Coon, MD       Or  . acetaminophen (TYLENOL) suppository 650 mg  650 mg Rectal Q4H PRN Lance Coon, MD      . atorvastatin (LIPITOR) tablet 20 mg  20 mg Oral q1800 Nettie Elm, MD   20 mg at 04/01/17 2241  . chlorhexidine (PERIDEX) 0.12 % solution 15 mL  15 mL Mouth Rinse BID Flora Lipps, MD   15 mL  at 04/02/17 0943  . hydrALAZINE (APRESOLINE) injection 10-40 mg  10-40 mg Intravenous Q4H PRN Wilhelmina Mcardle, MD   20 mg at 04/02/17 1314  . hydrochlorothiazide (HYDRODIURIL) tablet 25 mg  25 mg Oral Daily Nettie Elm, MD   25 mg at 04/02/17 0942  . lacosamide (VIMPAT) 100 mg in sodium chloride 0.9 % 25 mL IVPB  100 mg Intravenous Q12H Alexis Goodell, MD      . lactated ringers 1,000 mL with potassium chloride 20 mEq infusion   Intravenous Continuous Wilhelmina Mcardle, MD 50 mL/hr at 04/02/17 1300    . latanoprost (XALATAN) 0.005 % ophthalmic solution 1 drop  1 drop Both Eyes  QHS Nettie Elm, MD   1 drop at 04/01/17 2240  . letrozole [facility]) tablet 2.5 mg  2.5 mg Oral Daily Nettie Elm, MD   2.5 mg at 04/02/17 0942  . levothyroxine (SYNTHROID, LEVOTHROID) tablet 75 mcg  75 mcg Oral QAC breakfast Nettie Elm, MD   75 mcg at 04/02/17 [phone]  . lisinopril (PRINIVIL,ZESTRIL) tablet 20 mg  20 mg Oral Daily Wilhelmina Mcardle, MD   20 mg at 04/02/17 6440  . MEDLINE mouth rinse  15 mL Mouth Rinse q12n4p Flora Lipps, MD   15 mL at 04/01/17 1140  . metoprolol tartrate (LOPRESSOR) tablet 37.5 mg  37.5 mg Oral BID Wilhelmina Mcardle, MD   37.5 mg at 04/02/17 0943  . morphine 2 MG/ML injection 2-4 mg  2-4 mg Intravenous Q3H PRN Mikael Spray, NP   2 mg at 04/02/17 1146   Facility-Administered Medications Ordered in Other Encounters  Medication Dose Route Frequency Provider Last Rate Last Dose  . sodium chloride flush (NS) 0.9 % injection 10 mL  10 mL Intravenous PRN Lequita Asal, MD   10 mL at 01/11/17 1029     Discharge Medications: Please see discharge summary for a list of discharge medications.  Relevant Imaging Results:  Relevant Lab Results:   Additional Information SS# [id number]  Zettie Pho, LCSW

## 2017-04-02 NOTE — Progress Notes (Signed)
Mildly somnolent No distress on room air Not answering questions for me  Vitals:   04/02/17 0800 04/02/17 0815 04/02/17 0830 04/02/17 0942  BP: 135/89 (!) 146/83 (!) 150/86 (!) 153/93  Pulse: 82 81 84 78  Resp: 16 12 19    Temp: (!) 97.5 F (36.4 C)     TempSrc: Axillary     SpO2: 96% 96% 96%   Weight:      Height:        NAD HEENT WNL No JVD Chest clear Regular, no M NABS Extremities warm  BMP Latest Ref Rng & Units 04/02/2017 04/01/2017 04/01/2017  Glucose 65 - 99 mg/dL 134(H) - 136(H)  BUN 6 - 20 mg/dL 28(H) - 24(H)  Creatinine 0.44 - 1.00 mg/dL 0.66 - 0.65  BUN/Creat Ratio 12 - 28 - - -  Sodium 135 - 145 mmol/L 142 - 139  Potassium 3.5 - 5.1 mmol/L 3.1(L) 3.8 3.0(L)  Chloride 101 - 111 mmol/L 111 - 110  CO2 22 - 32 mmol/L 21(L) - 21(L)  Calcium 8.9 - 10.3 mg/dL 8.7(L) - 8.6(L)    CBC Latest Ref Rng & Units 04/01/2017 03/28/2017 03/22/2017  WBC 3.6 - 11.0 K/uL 8.5 8.7 3.9  Hemoglobin 12.0 - 16.0 g/dL 12.7 11.8(L) 11.9(L)  Hematocrit 35.0 - 47.0 % 38.0 36.4 36.9  Platelets 150 - 440 K/uL 201 176 186    No new CXR  IMPRESSION: ICH - surgery not indicated per NS Hypertension - now off nicardipine infusion. SBP goal < 140 mmHg Acute encephalopathy - controlled BUL opacities - these appear stable compared to prior imaging and do not warrant further evaluation given overall picture and guarded prognosis DNR  PLAN/REC: Continue HCTZ Continue Keppra Metoprolol dose from 25 mg BID to 37.5 mg BID increased 12/16 Lisinopril dose increased from 10 to 20 mg daily Continue PRN hydralazine to maintain SBP < 140 mmHg Transfer to medsurg with cardiac monitoring After transfer, PCCM will sign off. Please call if we can be of further assistance   Merton Border, MD PCCM service Mobile [phone] Pager [phone] 04/02/2017 12:46 PM

## 2017-04-02 NOTE — Progress Notes (Signed)
Physical Therapy Treatment Patient Details Name: Alexandra Glass MRN: [id number] DOB: [birth year] Today's Date: 04/02/2017    History of Present Illness Pt is a 83 y/o F s/p fall with R frontal ICH with midline shift with findings consistent with amyloid angiopathy.  She was found on the floor unresponsive.  Carotid dopplers show no evidence of hemodynamically significant stenosis.  Neurosurgery stated that this was not intervenable, and recommended medical management in the ICU. Echocardiogram shows no cardiac source of emboli with an EF of 55%. MRI brain on 12/13 showed slight interval increase in the size of the L F IPH, though the basal cisterns and other pertinent spinal fluid spaces show patency.  Received PT order and verbal confirmation from Dr. Doy Mince that pt may be seen by PT on 12/14 with no restrictions.Ascending aorta atherosclerosis noted.  Pt's PMH includes dementia, R breast cancer s/p mastectomy, renal cancer.    PT Comments    Pt presents with deficits in strength, transfers, mobility, gait, balance, and activity tolerance.  Pt tolerated mostly PROM to BLEs well with occasional limited AAROM during session .  Pt required extensive +2 assist with help from nursing during sup to/from sit.  Once sitting at EOB but pt's level of alertness improved with pt requiring frequent min A to prevent LOB in sitting.  Pt will benefit from PT services in a SNF setting upon discharge to safely address above deficits for decreased caregiver assistance and eventual return to PLOF.      Follow Up Recommendations  SNF     Equipment Recommendations  (TBD at next venue of care)    Recommendations for Other Services       Precautions / Restrictions Precautions Precautions: Fall Precaution Comments: Monitor BP; port L chest Restrictions Weight Bearing Restrictions: No    Mobility  Bed Mobility Overal bed mobility: Needs Assistance Bed Mobility: Sit to Supine;Supine to Sit     Supine to  sit: +2 for physical assistance;Max assist Sit to supine: Max assist;+2 for physical assistance   General bed mobility comments: Max verbal and tactile cues for sequencing but bed mobility very close to total assist  Transfers                 General transfer comment: Unable  Ambulation/Gait             General Gait Details: Unable   Stairs            Wheelchair Mobility    Modified Rankin (Stroke Patients Only)       Balance Overall balance assessment: Needs assistance Sitting-balance support: Single extremity supported;Bilateral upper extremity supported Sitting balance-Leahy Scale: Poor Sitting balance - Comments: Pt required frequent min A to maintain sitting balance at EOB Postural control: Right lateral lean                                  Cognition Arousal/Alertness: Lethargic Behavior During Therapy: Flat affect Overall Cognitive Status: Difficult to assess                                 General Comments: Pt lethargic but was awake for much of the session with very limited verbal communication       Exercises Total Joint Exercises Ankle Circles/Pumps: PROM;Both;Other (comment)(3 x 15 gentle PROM with hold at end range of DF) Short Arc Quad: PROM;Both;10  reps;15 reps Heel Slides: PROM;Both;10 reps;15 reps Hip ABduction/ADduction: PROM;Both;10 reps;15 reps Straight Leg Raises: PROM;Both;10 reps;15 reps    General Comments        Pertinent Vitals/Pain Pain Assessment: No/denies pain Pain Location: No signs of distress    Home Living                      Prior Function            PT Goals (current goals can now be found in the care plan section) Progress towards PT goals: Progressing toward goals    Frequency    7X/week      PT Plan Current plan remains appropriate    Co-evaluation              AM-PAC PT "6 Clicks" Daily Activity  Outcome Measure                    End of Session   Activity Tolerance: Patient limited by lethargy;Patient limited by fatigue Patient left: in bed;with call bell/phone within reach;with bed alarm set;with family/visitor present Nurse Communication: Mobility status PT Visit Diagnosis: Muscle weakness (generalized) (M62.81);History of falling (Z91.81);Unsteadiness on feet (R26.81);Difficulty in walking, not elsewhere classified (R26.2)     Time: 9381-8299 PT Time Calculation (min) (ACUTE ONLY): 25 min  Charges:  $Therapeutic Exercise: 8-22 mins $Therapeutic Activity: 8-22 mins                    G Codes:       DRoyetta Asal PT, DPT 04/02/17, 4:53 PM

## 2017-04-02 NOTE — Progress Notes (Signed)
Pharmacy Electrolyte Monitoring Consult:  Pharmacy consulted to assist in monitoring and replacing electrolytes in this 83 y.o. female admitted on 03/28/2017 with Fall   Labs:  Sodium (mmol/L)  Date Value  04/02/2017 142  10/14/2016 147 (H)  08/06/2014 137   Potassium (mmol/L)  Date Value  04/02/2017 3.1 (L)  08/06/2014 3.3 (L)   Magnesium (mg/dL)  Date Value  04/02/2017 2.3   Phosphorus (mg/dL)  Date Value  04/02/2017 2.6   Calcium (mg/dL)  Date Value  04/02/2017 8.7 (L)   Calcium, Total (mg/dL)  Date Value  08/06/2014 9.3   Albumin (g/dL)  Date Value  03/28/2017 3.7  10/14/2016 4.4  08/06/2014 3.7    Plan: Patient ordered potassium 60 mEq packet PO x 1 and potassium 52mEq IV x 1 per MD. Patient on hydrochlorothiazide.   12/15 1400 K+ WNL. No additonal replacement needed.   12/16  K 3.1, Mag 2.3  Phos 2.6.  MD ordered KCL 60 meq PO packet x 1 and KCL IV 10 meq x 4 (40 meq total).  Labs ordered for AM. Pharmacy will continue to monitor and adjust per consult.    Noralee Space, PharmD, BCPS Clinical Pharmacist 04/02/2017 8:44 AM

## 2017-04-02 NOTE — Progress Notes (Signed)
Subjective: Patient less agitated.  Off Precedex.  BP controlled.    Objective: Current vital signs: BP (!) 175/94   Pulse 65   Temp (!) 97.5 F (36.4 C) (Axillary)   Resp 13   Ht 5\' 4"  (1.626 m)   Wt 63.6 kg (140 lb 3.4 oz)   SpO2 96%   BMI 24.07 kg/m  Vital signs in last 24 hours: Temp:  [97.5 F (36.4 C)-98.2 F (36.8 C)] 97.5 F (36.4 C) (12/16 0800) Pulse Rate:  [63-99] 65 (12/16 1300) Resp:  [12-24] 13 (12/16 1300) BP: (107-175)/(64-126) 175/94 (12/16 1300) SpO2:  [91 %-97 %] 96 % (12/16 1300)  Intake/Output from previous day: 12/15 0701 - 12/16 0700 In: 392.3 [I.V.:292.3; IV Piggyback:100] Out: 1250 [Urine:1250] Intake/Output this shift: No intake/output data recorded. Nutritional status: DIET - DYS 1 Room service appropriate? Yes with Assist; Fluid consistency: Nectar Thick  Neurologic Exam: Mental Status: Alert.  Some attempts at speech.  Does not follow commands.   Cranial Nerves: II: Discs flat bilaterally; Pupils equal, round, reactive to light and accommodation.  Blinks to bilateral confrontation III,IV, VI: No eye deviation noted with active eye opening.   V,VII: Corneals intact bilaterally VIII: unable to test IX,X: unable to test XI: unable to test XII: unable to test Motor: Once active movement initiated patient able to maintain both arms against gravity.  Unable to cooperate for formal testing.      Lab Results: Basic Metabolic Panel: Recent Labs  Lab 03/29/17 1337  03/30/17 0539 03/31/17 0351 04/01/17 0459 04/01/17 0720 04/01/17 1342 04/02/17 0420  NA 140  --  137 138 139  --   --  142  K 3.1*   < > 3.0* 3.0* 3.0*  --  3.8 3.1*  CL 109  --  108 110 110  --   --  111  CO2 23  --  20* 21* 21*  --   --  21*  GLUCOSE 128*  --  128* 147* 136*  --   --  134*  BUN 19  --  18 28* 24*  --   --  28*  CREATININE 0.68  --  0.60 0.58 0.65  --   --  0.66  CALCIUM 8.4*  --  8.7* 8.6* 8.6*  --   --  8.7*  MG 2.1  --  2.1  --   --  2.2  --  2.3   PHOS  --   --   --  2.1* 2.7  --   --  2.6   < > = values in this interval not displayed.    Liver Function Tests: Recent Labs  Lab 03/28/17 2200  AST 26  ALT 13*  ALKPHOS 49  BILITOT 0.7  PROT 6.8  ALBUMIN 3.7   No results for input(s): LIPASE, AMYLASE in the last 168 hours. No results for input(s): AMMONIA in the last 168 hours.  CBC: Recent Labs  Lab 03/28/17 2200 04/01/17 0720  WBC 8.7 8.5  HGB 11.8* 12.7  HCT 36.4 38.0  MCV 78.4* 77.3*  PLT 176 201    Cardiac Enzymes: Recent Labs  Lab 03/28/17 2200  CKTOTAL 882*    Lipid Panel: Recent Labs  Lab 03/31/17 0351  CHOL 167  TRIG 58  HDL 72  CHOLHDL 2.3  VLDL 12  LDLCALC 83    CBG: Recent Labs  Lab 04/01/17 1558 04/01/17 1956 04/02/17 0015 04/02/17 0344 04/02/17 0734  GLUCAP 116* 118* 123* 121* 122*  Microbiology: Results for orders placed or performed during the hospital encounter of 03/28/17  Urine culture     Status: None   Collection Time: 03/28/17  8:18 PM  Result Value Ref Range Status   Specimen Description URINE, CATHETERIZED  Final   Special Requests NONE  Final   Culture   Final    NO GROWTH Performed at [facility], [address] [place], [place] [address]    Report Status 03/30/2017 FINAL  Final  MRSA PCR Screening     Status: None   Collection Time: 03/29/17  2:42 AM  Result Value Ref Range Status   MRSA by PCR NEGATIVE NEGATIVE Final    Comment:        The GeneXpert MRSA Assay (FDA approved for NASAL specimens only), is one component of a comprehensive MRSA colonization surveillance program. It is not intended to diagnose MRSA infection nor to guide or monitor treatment for MRSA infections.     Coagulation Studies: No results for input(s): LABPROT, INR in the last 72 hours.  Imaging: No results found.  Medications:  I have reviewed the patient's current medications. Scheduled: . atorvastatin  20 mg Oral q1800  . chlorhexidine  15 mL Mouth  Rinse BID  . hydrochlorothiazide  25 mg Oral Daily  . latanoprost  1 drop Both Eyes QHS  . letrozole  2.5 mg Oral Daily  . levothyroxine  75 mcg Oral QAC breakfast  . lisinopril  20 mg Oral Daily  . mouth rinse  15 mL Mouth Rinse q12n4p  . metoprolol tartrate  37.5 mg Oral BID    Assessment/Plan: Less agitation on Vimpat.  No seizures noted.  Remains aphasic.  Recommendations: 1.  Continue Vimpat at 100mg  BID   LOS: 4 days   Alexis Goodell, MD Neurology [phone] 04/02/2017  1:14 PM

## 2017-04-03 ENCOUNTER — Other Ambulatory Visit: Payer: Self-pay

## 2017-04-03 LAB — BASIC METABOLIC PANEL
Anion gap: 7 (ref 5–15)
BUN: 30 mg/dL — AB (ref 6–20)
CHLORIDE: 113 mmol/L — AB (ref 101–111)
CO2: 23 mmol/L (ref 22–32)
CREATININE: 0.72 mg/dL (ref 0.44–1.00)
Calcium: 8.9 mg/dL (ref 8.9–10.3)
GFR calc Af Amer: >60 mL/min (ref >60)
GLUCOSE: 126 mg/dL — AB (ref 65–99)
POTASSIUM: 3.8 mmol/L (ref 3.5–5.1)
Sodium: 143 mmol/L (ref 135–145)

## 2017-04-03 LAB — MAGNESIUM: Magnesium: 2.4 mg/dL (ref 1.7–2.4)

## 2017-04-03 LAB — PHOSPHORUS: Phosphorus: 2.5 mg/dL (ref 2.5–4.6)

## 2017-04-03 MED ORDER — LACOSAMIDE 50 MG PO TABS
100.0000 mg | ORAL_TABLET | Freq: Two times a day (BID) | ORAL | Status: DC
  Administered 2017-04-03 – 2017-04-05 (×4): 100 mg via ORAL
  Filled 2017-04-03 (×5): qty 2

## 2017-04-03 NOTE — Progress Notes (Signed)
Daily Progress Note   Patient Name: Alexandra Glass       Date: 04/03/2017 DOB: 08-Feb-1925  Age: 83 y.o. MRN#: [id number] Attending Physician: Hillary Bow, MD Primary Care Physician: Juline Patch, MD Admit Date: 03/28/2017  Reason for Consultation/Follow-up: Establishing goals of care  Subjective: Patient in bed. Answers some questions. Appears frustrated with aphasia. Calm and pleasant.  Spoke with patient's son for followup of palliative care consult. No questions or concerns. He states he will contact palliative medicine if he has concerns or decisions to make.   Review of Systems  Unable to perform ROS: Acuity of condition    Length of Stay: 5  Current Medications: Scheduled Meds:  . atorvastatin  20 mg Oral q1800  . chlorhexidine  15 mL Mouth Rinse BID  . hydrochlorothiazide  25 mg Oral Daily  . lacosamide  100 mg Oral BID  . latanoprost  1 drop Both Eyes QHS  . letrozole  2.5 mg Oral Daily  . levothyroxine  75 mcg Oral QAC breakfast  . lisinopril  20 mg Oral Daily  . mouth rinse  15 mL Mouth Rinse q12n4p  . metoprolol tartrate  37.5 mg Oral BID    Continuous Infusions: . lactated ringers with kcl 50 mL/hr at 04/03/17 0401    PRN Meds: acetaminophen **OR** [DISCONTINUED] acetaminophen (TYLENOL) oral liquid 160 mg/5 mL **OR** acetaminophen, hydrALAZINE, morphine injection  Physical Exam  Constitutional: She appears well-developed and well-nourished. No distress.  Neurological:  Unable to assess orientation due to aphasia  Skin:  vitiligo   Nursing note and vitals reviewed.           Vital Signs: BP (!) 146/74 (BP Location: Left Arm)   Pulse 71   Temp 97.7 F (36.5 C) (Axillary)   Resp 18   Ht 5\' 4"  (1.626 m)   Wt 63.6 kg (140 lb 3.4 oz)   SpO2 97%   BMI  24.07 kg/m  SpO2: SpO2: 97 % O2 Device: O2 Device: Not Delivered O2 Flow Rate:    Intake/output summary:   Intake/Output Summary (Last 24 hours) at 04/03/2017 1050 Last data filed at 04/03/2017 0821 Gross per 24 hour  Intake 943 ml  Output 1400 ml  Net -457 ml   LBM: Last BM Date: 04/02/17 Baseline Weight: Weight: 63.6 kg (140 lb 3.4  oz) Most recent weight: Weight: 63.6 kg (140 lb 3.4 oz)       Palliative Assessment/Data: PPS: 30%   Flowsheet Rows     Most Recent Value  Intake Tab  Referral Department  Critical care  Unit at Time of Referral  ICU  Palliative Care Primary Diagnosis  Neurology  Palliative Care Type  New Palliative care  Date first seen by Palliative Care  03/31/17  Clinical Assessment  Palliative Performance Scale Score  30%  Psychosocial & Spiritual Assessment  Palliative Care Outcomes  Patient/Family meeting held?  Yes  Who was at the meeting?  patient and son  Palliative Care Outcomes  Clarified goals of care, Provided psychosocial or spiritual support, ACP counseling assistance, Linked to palliative care logitudinal support      Patient Active Problem List   Diagnosis Date Noted  . Dysphagia   . Aphasia   . Palliative care by specialist   . Goals of care, counseling/discussion   . Intracranial hemorrhage ([place]) 03/29/2017  . Microcytic red blood cells 03/08/2017  . Hypothyroidism 03/28/2016  . Osteopenia 03/21/2016  . Hypokalemia 12/09/2015  . 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 History of stroke 11/03/2015  . Essential hypertension 10/06/2015  . Cerebral vascular disease 10/06/2015  . TIA (transient ischemic attack) 09/27/2015  . Accelerated hypertension 09/27/2015  . Chronic dementia without behavioral disturbance 09/27/2015  . B12 deficiency 03/03/2015  . Amnesia 11/27/2014  . Breast cancer, right breast ([place]) 11/12/2014  . Cancer of upper lobe of right lung ([place]) 11/12/2014  . Bilateral kidney masses 11/12/2014  .  Hyperlipidemia 10/09/2014    Palliative Care Assessment & Plan   Patient Profile: 83 y.o. female  with past medical history of thyroid disease, renal and breast cancer followed by oncology, CVA, and dementia admitted on 03/28/2017 after found unresponsive by family at home. Head CT revealed left frontal ICH. Neurology and neurosurgery following. No surgical interventions and plan to manage medically. MRI brain 12/13 continues to show left frontal lobe hemorrhage with intraventricular extension and 22mm left to right midline shift. MRA reveals severe stenosis of nondominant distal vertebral. Neuro recommending EEG. Followed by SLP and started on dysphagia 1 nectar thick diet. PT/OT following. DNR. Palliative medicine consultation for goals of care.   Assessment/Recommendations/Plan   Continue current level of care  PMT will shadow and intervene if patient declines or family contacts with needs, please call if needed sooner.  Goals of Care and Additional Recommendations:  Limitations on Scope of Treatment: Full Scope Treatment  Code Status:  DNR  Prognosis:   Unable to determine  Discharge Planning:  To Be Determined  Care plan was discussed with patient's son.  Thank you for allowing the Palliative Medicine Team to assist in the care of this patient.   Time In: 1030 Time Out: 1100 Total Time 30 mins Prolonged Time Billed no      Greater than 50%  of this time was spent counseling and coordinating care related to the above assessment and plan.  Mariana Kaufman, AGNP-C Palliative Medicine   Please contact Palliative Medicine Team phone at [phone] for questions and concerns.

## 2017-04-03 NOTE — Progress Notes (Signed)
Rec'd transfer from ICU #2; telemetry confirmed to box MX 40-50; external catheter intact; Left chest POC accessed with fluids running at 99ml/hour; non-verbal at this time; [facility] elevated for aspiration precautions; pads added to SR for seizure precautions; bed alarm activated for fall precautions; pink sleeve on RUE for Right mastectomy precautions; VSS; no acute distress noted at this time; no family present at this time; Barbaraann Faster, RN 12:45 AM 04/03/2017

## 2017-04-03 NOTE — Progress Notes (Signed)
Stroke booklet placed at bedside for family's review. Barbaraann Faster, RN 5:18 AM 04/03/2017

## 2017-04-03 NOTE — Progress Notes (Signed)
[facility] [facility] [place] NAME: Alexandra Glass    MR#:  [id number]  DATE OF BIRTH:  [birth year]    CHIEF COMPLAINT:   Chief Complaint  Patient presents with  . Fall   Patient continues to be confused.   Holding food in her mouth and not swallowing.  REVIEW OF SYSTEMS:    Review of Systems  Unable to perform ROS: Mental acuity    DRUG ALLERGIES:   Allergies  Allergen Reactions  . No Known Allergies     VITALS:  Blood pressure 109/65, pulse 69, temperature 98.4 F (36.9 C), temperature source Axillary, resp. rate 16, height 5\' 4"  (1.626 m), weight 63.6 kg (140 lb 3.4 oz), SpO2 98 %.  PHYSICAL EXAMINATION:   Physical Exam  GENERAL:  83 y.o.-year-old patient lying in the bed with no acute distress.  Patient has vitiligo. EYES: Pupils equal, round, reactive to light. No scleral icterus. Extraocular muscles intact.  HEENT: Head atraumatic, normocephalic. Oropharynx and nasopharynx clear.  NECK:  Supple, no jugular venous distention. No thyroid enlargement, no tenderness.  LUNGS: Normal breath sounds bilaterally, no wheezing, rales,rhonchi or crepitation. No use of accessory muscles of respiration.  CARDIOVASCULAR: S1, S2 normal. No murmurs, rubs, or gallops.  ABDOMEN: Soft, nontender, nondistended. Bowel sounds present. No organomegaly or mass.  EXTREMITIES: No edema. Psych;unable to follow commands   LABORATORY PANEL:   CBC Recent Labs  Lab 04/01/17 0720  WBC 8.5  HGB 12.7  HCT 38.0  PLT 201   ------------------------------------------------------------------------------------------------------------------  Chemistries  Recent Labs  Lab 03/28/17 2200  04/03/17 0501  NA 140   < > 143  K 3.3*   < > 3.8  CL 109   < > 113*  CO2 23   < > 23  GLUCOSE 148*   < > 126*  BUN 23*   < > 30*  CREATININE 0.82   < > 0.72  CALCIUM 8.8*   < > 8.9  MG  --    < > 2.4  AST 26  --   --   ALT 13*  --   --   ALKPHOS 49  --   --    BILITOT 0.7  --   --    < > = values in this interval not displayed.   ------------------------------------------------------------------------------------------------------------------  Cardiac Enzymes No results for input(s): TROPONINI in the last 168 hours. ------------------------------------------------------------------------------------------------------------------  RADIOLOGY:  No results found.   ASSESSMENT AND PLAN:   Principal Problem:   Intracranial hemorrhage (HCC) Active Problems:   Chronic dementia without behavioral disturbance   Essential hypertension   Cerebral vascular disease   Hypothyroidism   Dysphagia   Aphasia   Palliative care by specialist   Goals of care, counseling/discussion   * Large intracranial hemorrhage in the left frontal area with mild midline shift from left to right.  Weaned off nicardipine drip, continuing p.o. metoprolol HCTZ On Vimpat. Appreciate neurology help Started on dysphagia 1 diet.  Patient has poor appetite and continues to be encephalopathy over her dementia.  Nutrition is a concern.  Discussed with son at bedside.  Per family patient is much improved compared to yesterday.  Will monitor on the 24 hours.  If no improvement she would be a candidate for hospice home. Discussed with palliative care.  * hypothyroid rhythm: Continue Synthyroid.  * Radiation fibrosis of lung  * Hypokalemia Replaced  All the records are reviewed and case discussed with Care Management/Social Workerr.  Management plans discussed with the patient, family and they are in agreement.  CODE STATUS:DNR  TOTAL TIME TAKING CARE OF THIS PATIENT: 35 minutes.   POSSIBLE D/C IN 1-2 DAYS, DEPENDING ON CLINICAL CONDITION.  Leia Alf Anavi Branscum M.D on 04/03/2017 at 2:48 PM  Between 7am to 6pm - Pager - [phone]  After 6pm go to [email or web address] - password [email or web address] [facility]  Office  [phone]  CC: Primary care physician;  Juline Patch, MD

## 2017-04-03 NOTE — Progress Notes (Signed)
Subjective: Patient in bed, quiet and calm.  Objective: Current vital signs: BP (!) 155/83 (BP Location: Left Arm)   Pulse 76   Temp 97.7 F (36.5 C) (Axillary)   Resp 18   Ht 5\' 4"  (1.626 m)   Wt 63.6 kg (140 lb 3.4 oz)   SpO2 97%   BMI 24.07 kg/m  Vital signs in last 24 hours: Temp:  [97.1 F (36.2 C)-98.5 F (36.9 C)] 97.7 F (36.5 C) (12/17 0943) Pulse Rate:  [57-81] 76 (12/17 0943) Resp:  [12-23] 18 (12/17 0943) BP: (105-184)/(68-131) 155/83 (12/17 0943) SpO2:  [94 %-98 %] 97 % (12/17 0943)  Intake/Output from previous day: 12/16 0701 - 12/17 0700 In: 748.3 [I.V.:723.3; IV Piggyback:25] Out: 1100 [Urine:1100] Intake/Output this shift: Total I/O In: 213 [I.V.:213] Out: 300 [Urine:300] Nutritional status: DIET - DYS 1 Room service appropriate? Yes with Assist; Fluid consistency: Nectar Thick  Neurologic Exam: Mental Status: Alert.  Some attempts at speech. Able to get out some words in a whisper but unable to string any words together.  Follows some simple commands but no midline commands.   Cranial Nerves: II: Discs flat bilaterally;Pupils equal, round, reactive to light and accommodation.  Blinks to bilateral confrontation III,IV, VI:No eye deviation noted.  V,VII:Corneals intact bilaterally VIII:unable to test IX,X:unable to test ZH:YQMVHQ to test ION:GEXBMW to test Motor: Able to lift both arms to command.   Lab Results: Basic Metabolic Panel: Recent Labs  Lab 03/29/17 1337  03/30/17 0539 03/31/17 0351 04/01/17 0459 04/01/17 0720 04/01/17 1342 04/02/17 0420 04/03/17 0501  NA 140  --  137 138 139  --   --  142 143  K 3.1*   < > 3.0* 3.0* 3.0*  --  3.8 3.1* 3.8  CL 109  --  108 110 110  --   --  111 113*  CO2 23  --  20* 21* 21*  --   --  21* 23  GLUCOSE 128*  --  128* 147* 136*  --   --  134* 126*  BUN 19  --  18 28* 24*  --   --  28* 30*  CREATININE 0.68  --  0.60 0.58 0.65  --   --  0.66 0.72  CALCIUM 8.4*  --  8.7* 8.6* 8.6*  --    --  8.7* 8.9  MG 2.1  --  2.1  --   --  2.2  --  2.3 2.4  PHOS  --   --   --  2.1* 2.7  --   --  2.6 2.5   < > = values in this interval not displayed.    Liver Function Tests: Recent Labs  Lab 03/28/17 2200  AST 26  ALT 13*  ALKPHOS 49  BILITOT 0.7  PROT 6.8  ALBUMIN 3.7   No results for input(s): LIPASE, AMYLASE in the last 168 hours. No results for input(s): AMMONIA in the last 168 hours.  CBC: Recent Labs  Lab 03/28/17 2200 04/01/17 0720  WBC 8.7 8.5  HGB 11.8* 12.7  HCT 36.4 38.0  MCV 78.4* 77.3*  PLT 176 201    Cardiac Enzymes: Recent Labs  Lab 03/28/17 2200  CKTOTAL 882*    Lipid Panel: Recent Labs  Lab 03/31/17 0351  CHOL 167  TRIG 58  HDL 72  CHOLHDL 2.3  VLDL 12  LDLCALC 83    CBG: Recent Labs  Lab 04/01/17 1558 04/01/17 1956 04/02/17 0015 04/02/17 0344 04/02/17 [place]  116* 118* 75* 75* 48*    Microbiology: Results for orders placed or performed during the hospital encounter of 03/28/17  Urine culture     Status: None   Collection Time: 03/28/17  8:18 PM  Result Value Ref Range Status   Specimen Description URINE, CATHETERIZED  Final   Special Requests NONE  Final   Culture   Final    NO GROWTH Performed at [facility], [address] [place], [place] [address]    Report Status 03/30/2017 FINAL  Final  MRSA PCR Screening     Status: None   Collection Time: 03/29/17  2:42 AM  Result Value Ref Range Status   MRSA by PCR NEGATIVE NEGATIVE Final    Comment:        The GeneXpert MRSA Assay (FDA approved for NASAL specimens only), is one component of a comprehensive MRSA colonization surveillance program. It is not intended to diagnose MRSA infection nor to guide or monitor treatment for MRSA infections.     Coagulation Studies: No results for input(s): LABPROT, INR in the last 72 hours.  Imaging: No results found.  Medications:  I have reviewed the patient's current medications. Scheduled: .  atorvastatin  20 mg Oral q1800  . chlorhexidine  15 mL Mouth Rinse BID  . hydrochlorothiazide  25 mg Oral Daily  . latanoprost  1 drop Both Eyes QHS  . letrozole  2.5 mg Oral Daily  . levothyroxine  75 mcg Oral QAC breakfast  . lisinopril  20 mg Oral Daily  . mouth rinse  15 mL Mouth Rinse q12n4p  . metoprolol tartrate  37.5 mg Oral BID    Assessment/Plan: Patient with some modest improvement.  No seizures noted.  On Vimpat.  No longer appears agitated.    Recommendations: 1.  Vimpat to be changed to po 2.  Continue therapy   LOS: 5 days   Alexis Goodell, MD Neurology [phone] 04/03/2017  10:01 AM

## 2017-04-03 NOTE — Progress Notes (Signed)
Speech Language Pathology Treatment: Dysphagia  Patient Details Name: Alexandra Glass MRN: [id number] DOB: 1924/04/28 Today's Date: 04/03/2017 Time: 5643-3295 SLP Time Calculation (min) (ACUTE ONLY): 45 min  Assessment / Plan / Recommendation Clinical Impression  Pt seen for ongoing assessment of toleration of diet today. Pt is currently on the dysphagia level 1(puree) w/ Nectar liquids d/t concern for dysphagia and aspiration secondary to Neurological status and Cognitive decline. NSG reported today that pt is orally holding po's requiring mod-max cues to swallow/clear. She has been tolerating her Pills Crushed in puree w/ NSG.  Pt continues to present w/ declined Cognitive status w/ possible "sun-downing" as per report. In addition, pt has Alexandra IPH w/ slight increase in the size of her IPH per chart notes. The IPH will likely evolve with time, and will take several weeks to months to resolve per Neurosurgery. With her medical status and age, recommend Alexandra Cognitive evaluation once she is able to discharge to [facility] SNF - Alexandra more stable environment w/ routine in which Cognitive-Linguistic skills for ADLs can be assessed. Pt is verbally responsive to general questions re: self, confusion noted in responses and topic at times; speech is clear.  Pt consumed few po trials of Nectar liquids via TSP and tsps of puree w/ no immediate, overt s/s of aspiration noted; no decline in respiratory status or O2 sats. Oral phase c/b min delay in Alexandra-P transfer/holding - decreased awareness overall for the task of po intake/swallowing d/t distraction and Cognition. Recommend pt only take po's when fully awake and calm for task to reduce risk for aspiration, dysphagia. Recommend reduce distractions in environment. Pt requires feeding assistance and verbal cues, monitoring at meals for swallowing and oral clearing. This current diet is best for pt at this time d/t presentation and dysphagia. ST services will f/u w/ pt's medical and  Cognitive status' are appropriate for potential upgrade in diet consistency when appropriate, safe for pt. Recommend continue w/ Alexandra Dysphagia level 1(puree) w/ Nectar liquids; aspiration precautions; Pills in Puree - crushed as needed. Recommend Palliative Care f/u for goals of care. NSG updated. Niece agreed.    HPI HPI: Pt is Alexandra Glass with known history of breast cancer, stage -3 lung cancer and thyroid disease. Apparently  Patient lives by herself. Family members tried to reach her by phone and were unable to reach her.  She was found on the floor covered in her urine. According to the family member she was seen in her usual state of health on 12/10.  Upon arrival to the ED she was sent to get Alexandra CT of her head which was concerning for large left frontal lobe hematoma.  She is not Alexandra surgical candidate as per the Neurosurgeon at [facility] and needs to be managed medically.  Patient was sent to the ICU for closer monitoring. 12/12 CT head and spine>>Large intraparenchymal hematoma in the left frontal lobe. Underlying mass lesion is possible. 0.7 cm left right midline shift is present.Atrophy and extensive chronic microvascular ischemic change. Remote Left MCA per notes. Pt mostly nonverbal and easily distracted; required mod-max cues for follow through w/ tasks. NSG reported oral holding and needing max cues to swallow and clear during earlier meals today.       SLP Plan  Continue with current plan of care       Recommendations  Diet recommendations: Dysphagia 1 (puree);Nectar-thick liquid Liquids provided via: Teaspoon;Cup(monitor any straw use) Medication Administration: Crushed with puree Supervision: Staff to assist with  self feeding;Full supervision/cueing for compensatory strategies Compensations: Minimize environmental distractions;Slow rate;Small sips/bites;Lingual sweep for clearance of pocketing;Multiple dry swallows after each bite/sip;Follow solids with liquid Postural Changes  and/or Swallow Maneuvers: Seated upright 90 degrees;Upright 30-60 min after meal                General recommendations: (Dietician f/u) Oral Care Recommendations: Oral care BID;Staff/trained caregiver to provide oral care Follow up Recommendations: Skilled Nursing facility SLP Visit Diagnosis: Dysphagia, oropharyngeal phase (R13.12) Plan: Continue with current plan of care       Man, [place], CCC-SLP [name],[name] 04/03/2017, 6:21 PM

## 2017-04-03 NOTE — Progress Notes (Signed)
SBP sustained elevation >160 X2; Hydralazine 20mg  IVP given per MD order; will continue to monitor. Barbaraann Faster, RN 1:53 AM. 04/03/2017

## 2017-04-03 NOTE — Progress Notes (Signed)
OT Cancellation Note  Patient Details Name: Alexandra Glass MRN: [id number] DOB: [birth year]   Cancelled Treatment:    Reason Eval/Treat Not Completed: Other (comment). Upon attempt, pt with nursing. Will re-attempt at later date/time as pt is available.  Jeni Salles, MPH, MS, OTR/L ascom [phone] 04/03/17, 3:28 PM

## 2017-04-03 NOTE — Progress Notes (Signed)
Physical Therapy Treatment Patient Details Name: Alexandra Glass MRN: [id number] DOB: [birth year] Today's Date: 04/03/2017    History of Present Illness Pt is a 83 y/o F s/p fall with R frontal ICH with midline shift with findings consistent with amyloid angiopathy.  She was found on the floor unresponsive.  Carotid dopplers show no evidence of hemodynamically significant stenosis.  Neurosurgery stated that this was not intervenable, and recommended medical management in the ICU. Echocardiogram shows no cardiac source of emboli with an EF of 55%. MRI brain on 12/13 showed slight interval increase in the size of the L F IPH, though the basal cisterns and other pertinent spinal fluid spaces show patency.  Received PT order and verbal confirmation from Dr. Doy Mince that pt may be seen by PT on 12/14 with no restrictions.Ascending aorta atherosclerosis noted.  Pt's PMH includes dementia, R breast cancer s/p mastectomy, renal cancer.    PT Comments    Pt showed gross improvement in alertness and verbal communication but overall remains lethargic with difficulty following verbal commands and answering questions appropriately.  Pt continues to require extensive assistance with bed mobility tasks although pt was able to maintain improved sitting balance once sitting up to EOB.  Pt was able to stand x 4 with +2 mod-max A with RW but fatigued quickly and would impulsively return to sitting at EOB.  Vital signs WNL during session.  Pt will benefit from PT services in a SNF setting upon discharge to safely address above deficits for decreased caregiver assistance and eventual return to PLOF.      Follow Up Recommendations  SNF     Equipment Recommendations       Recommendations for Other Services       Precautions / Restrictions Precautions Precautions: Fall Precaution Comments: Monitor BP; port L chest Restrictions Weight Bearing Restrictions: No    Mobility  Bed Mobility Overal bed mobility:  Needs Assistance Bed Mobility: Sit to Supine;Supine to Sit     Supine to sit: Max assist Sit to supine: +2 for physical assistance;Max assist   General bed mobility comments: Max verbal and tactile cues for sequencing  Transfers Overall transfer level: Needs assistance Equipment used: Rolling walker (2 wheeled) Transfers: Sit to/from Stand Sit to Stand: Max assist;+2 physical assistance;Mod assist         General transfer comment: Sit to stand transfers x 4 with level of assistance required decreasing from +2 max to +2 mod A.  Max standing time around 10-15 sec with pt then impulsivley returning to sitting at EOB.     Ambulation/Gait             General Gait Details: Unable   Financial trader Rankin (Stroke Patients Only)       Balance Overall balance assessment: Needs assistance Sitting-balance support: Single extremity supported;Bilateral upper extremity supported Sitting balance-Leahy Scale: Poor Sitting balance - Comments: Pt required occasional min A to maintain sitting balance at EOB but improved from prior session   Standing balance support: Bilateral upper extremity supported Standing balance-Leahy Scale: Poor                              Cognition Arousal/Alertness: Lethargic(Improving) Behavior During Therapy: Flat affect Overall Cognitive Status: Difficult to assess  General Comments: Pt remains lethargic but improvement noted in both level of alertness and verbal communication       Exercises Total Joint Exercises Ankle Circles/Pumps: Both;Other (comment);AAROM;PROM;10 reps(Mostly PROM with occasional AAROM with max verbal and tactile cues) Short Arc Quad: PROM;AAROM;Both;10 reps;Other (comment)(Mostly PROM with occasional AAROM with max verbal and tactile cues) Heel Slides: PROM;AAROM;Both;10 reps;Other (comment)(Mostly PROM with occasional AAROM  with max verbal and tactile cues) Hip ABduction/ADduction: PROM;AAROM;Both;Other (comment);10 reps(Mostly PROM with occasional AAROM with max verbal and tactile cues) Straight Leg Raises: PROM;AAROM;Both;Other (comment);10 reps    General Comments        Pertinent Vitals/Pain Pain Assessment: No/denies pain Pain Location: No signs of distress    Home Living                      Prior Function            PT Goals (current goals can now be found in the care plan section) Progress towards PT goals: Progressing toward goals    Frequency    7X/week      PT Plan Current plan remains appropriate    Co-evaluation              AM-PAC PT "6 Clicks" Daily Activity  Outcome Measure                   End of Session Equipment Utilized During Treatment: Gait belt Activity Tolerance: Patient limited by lethargy;Patient limited by fatigue Patient left: in bed;with call bell/phone within reach;with bed alarm set Nurse Communication: Mobility status PT Visit Diagnosis: Muscle weakness (generalized) (M62.81);History of falling (Z91.81);Unsteadiness on feet (R26.81);Difficulty in walking, not elsewhere classified (R26.2)     Time: 0240-9735 PT Time Calculation (min) (ACUTE ONLY): 24 min  Charges:  $Therapeutic Exercise: 8-22 mins $Therapeutic Activity: 8-22 mins                    G Codes:       DRoyetta Asal PT, DPT 04/03/17, 12:15 PM

## 2017-04-04 DIAGNOSIS — I679 Cerebrovascular disease, unspecified: Secondary | ICD-10-CM

## 2017-04-04 LAB — PHOSPHORUS: Phosphorus: 2.9 mg/dL (ref 2.5–4.6)

## 2017-04-04 LAB — MAGNESIUM: Magnesium: 2.2 mg/dL (ref 1.7–2.4)

## 2017-04-04 LAB — BASIC METABOLIC PANEL
ANION GAP: 7 (ref 5–15)
BUN: 33 mg/dL — ABNORMAL HIGH (ref 6–20)
CO2: 25 mmol/L (ref 22–32)
Calcium: 9 mg/dL (ref 8.9–10.3)
Chloride: 112 mmol/L — ABNORMAL HIGH (ref 101–111)
Creatinine, Ser: 0.81 mg/dL (ref 0.44–1.00)
GFR calc Af Amer: >60 mL/min (ref >60)
GFR calc non Af Amer: >60 mL/min (ref >60)
GLUCOSE: 123 mg/dL — AB (ref 65–99)
POTASSIUM: 3.6 mmol/L (ref 3.5–5.1)
Sodium: 144 mmol/L (ref 135–145)

## 2017-04-04 NOTE — Progress Notes (Signed)
Occupational Therapy Treatment Patient Details Name: Alexandra Glass MRN: [id number] DOB: 1925-02-02 Today's Date: 04/04/2017    History of present illness Pt is a 83 y/o F s/p fall with R frontal ICH with midline shift with findings consistent with amyloid angiopathy.  She was found on the floor unresponsive.  Carotid dopplers show no evidence of hemodynamically significant stenosis.  Neurosurgery stated that this was not intervenable, and recommended medical management in the ICU. Echocardiogram shows no cardiac source of emboli with an EF of 55%. MRI brain on 12/13 showed slight interval increase in the size of the L F IPH, though the basal cisterns and other pertinent spinal fluid spaces show patency.  Received PT order and verbal confirmation from Dr. Doy Mince that pt may be seen by PT on 12/14 with no restrictions.Ascending aorta atherosclerosis noted.  Pt's PMH includes dementia, R breast cancer s/p mastectomy, renal cancer.   OT comments  Pt seen for brief OT treatment this date. Pt continues to be very lethargic, responding occasionally to name by briefly partially opening her eyes. Unable to follow commands and does not turn head towards sound of OT's voice. PROM exercises performed to bilateral UEs to minimize stiffness and risk of contracture as well as improve comfort and in attempt to improve alertness for functional ADL tasks. Minor tone noted inconsistently with elbow flexion/extension, otherwise no volitional movement noted. Family in room notes that pt has been moving BUE this date and had eaten a few bites of mashed potatoes and took sips of thickened drink on meal tray earlier with family assist. No distress noted during session from pt with PROM. Will continue attempts to progress towards OT goals.    Follow Up Recommendations  SNF    Equipment Recommendations       Recommendations for Other Services      Precautions / Restrictions Precautions Precautions: Fall Precaution  Comments: Monitor BP; port L chest Restrictions Weight Bearing Restrictions: No       Mobility Bed Mobility                  Transfers                      Balance                                           ADL either performed or assessed with clinical judgement   ADL Overall ADL's : Needs assistance/impaired Eating/Feeding: Total assistance   Grooming: Total assistance   Upper Body Bathing: Total assistance   Lower Body Bathing: Total assistance   Upper Body Dressing : Total assistance   Lower Body Dressing: Total assistance   Toilet Transfer: Total assistance                   Vision       Perception     Praxis      Cognition Arousal/Alertness: Lethargic Behavior During Therapy: Flat affect Overall Cognitive Status: Difficult to assess                                 General Comments: lethargic, falling asleep during session        Exercises General Exercises - Upper Extremity Shoulder Flexion: PROM;AAROM;Both;10 reps;Supine Shoulder Extension: PROM;Both;10 reps Shoulder ABduction: PROM;Supine;Both;10 reps Shoulder ADduction:  PROM;Supine;10 reps;Both Shoulder Horizontal ABduction: PROM;Both;10 reps Shoulder Horizontal ADduction: PROM;10 reps;Both;Supine Elbow Flexion: PROM;Supine;Both;10 reps Elbow Extension: PROM;Both;10 reps;Supine Wrist Flexion: PROM;Both;10 reps;Supine Wrist Extension: PROM;10 reps;Both;Supine Digit Composite Flexion: PROM;Both;10 reps;Supine Composite Extension: PROM;Both;10 reps;Supine      Shoulder Instructions       General Comments      Pertinent Vitals/ Pain       Pain Assessment: Faces Faces Pain Scale: No hurt  Home Living                                          Prior Functioning/Environment              Frequency  Min 1X/week        Progress Toward Goals  OT Goals(current goals can now be found in the care plan  section)  Progress towards OT goals: Not progressing toward goals - comment(pt continues to be very lethargic, unable to follow commands)  Acute Rehab OT Goals Patient Stated Goal: unable to state  Plan Discharge plan remains appropriate;Frequency needs to be updated    Co-evaluation                 AM-PAC PT "6 Clicks" Daily Activity     Outcome Measure   Help from another person eating meals?: Total Help from another person taking care of personal grooming?: Total Help from another person toileting, which includes using toliet, bedpan, or urinal?: Total Help from another person bathing (including washing, rinsing, drying)?: Total Help from another person to put on and taking off regular upper body clothing?: Total Help from another person to put on and taking off regular lower body clothing?: Total 6 Click Score: 6    End of Session    OT Visit Diagnosis: Other symptoms and signs involving cognitive function;Muscle weakness (generalized) (M62.81)   Activity Tolerance Patient limited by lethargy   Patient Left in bed;with family/visitor present;with call bell/phone within reach;with bed alarm set   Nurse Communication          Time: 7579-7282 OT Time Calculation (min): 10 min  Charges: OT General Charges $OT Visit: 1 Visit OT Treatments $Therapeutic Exercise: 8-22 mins  Jeni Salles, MPH, MS, OTR/L ascom [phone] 04/04/17, 2:18 PM

## 2017-04-04 NOTE — Progress Notes (Addendum)
  Speech Language Pathology Treatment: Dysphagia  Patient Details Name: Alexandra Glass MRN: [id number] DOB: Nov 27, 1924 Today's Date: 04/04/2017 Time: 1115-1200 SLP Time Calculation (min) (ACUTE ONLY): 45 min  Assessment / Plan / Recommendation Clinical Impression  Pt seen today for ongoing assessment of toleration of diet, however, pt is more drowsy and lethargic and has not been taking much po per NSG report. Pt has only accepted a few small tsps of Nectar liquids and a puree per family and NSG. Education and discussion w/ family this session re: aspiration precautions; support at meals to facilitate eating/drinking - use of TSP; food prep and options; liquid consistency recommended(Nectar) for safer swallowing; impact of Cognitive decline on swallowing overall. Handouts and information given to Son/family on thickened liquid supplies, ordering. Recommend pleasure po's if desired if awake and appropriate, safe. ST services can be reconsulted if pt's medical and Cognitive status' improve. NSG updated and agreed. Palliative Care following now for goals of care.     HPI HPI: Pt is a 83 year old female with known history of breast cancer, stage -3 lung cancer and thyroid disease. Apparently  Patient lives by herself. Family members tried to reach her by phone and were unable to reach her.  She was found on the floor covered in her urine. According to the family member she was seen in her usual state of health on 12/10.  Upon arrival to the ED she was sent to get a CT of her head which was concerning for large left frontal lobe hematoma.  She is not a surgical candidate as per the Neurosurgeon at [facility] and needs to be managed medically.  Patient was sent to the ICU for closer monitoring. 12/12 CT head and spine>>Large intraparenchymal hematoma in the left frontal lobe. Underlying mass lesion is possible. 0.7 cm left right midline shift is present.Atrophy and extensive chronic microvascular ischemic change. Remote  Left MCA per notes. Pt mostly nonverbal and easily distracted; required mod-max cues for follow through w/ tasks. NSG reported oral holding and needing max cues to swallow and clear during earlier meals today.       SLP Plan  All goals met; pt appears at a declined status w/ Palliative Care following w/ family to establish goals of care - family is discussing options. Pt is able to tolerate small tsps of puree/nectar liquid for pleasure w/ no overt decline in status. ST services can be reconsulted if pt's medical and Cognitive status' improve.        Recommendations  Diet recommendations: Dysphagia 1 (puree);Nectar-thick liquid Liquids provided via: Teaspoon;Cup Medication Administration: Crushed with puree Supervision: Staff to assist with self feeding;Full supervision/cueing for compensatory strategies Compensations: Minimize environmental distractions;Slow rate;Small sips/bites;Lingual sweep for clearance of pocketing;Multiple dry swallows after each bite/sip;Follow solids with liquid Postural Changes and/or Swallow Maneuvers: Seated upright 90 degrees;Upright 30-60 min after meal                General recommendations: (Dietician f/u) Oral Care Recommendations: Oral care BID;Staff/trained caregiver to provide oral care(even more frequently if not taking much po) Follow up Recommendations: Skilled Nursing facility(TBD vs Hospice) SLP Visit Diagnosis: Dysphagia, oropharyngeal phase (R13.12) Plan: All goals met       GO               Orinda Kenner, MS, CCC-SLP Glass,Alexandra 04/04/2017, 5:02 PM

## 2017-04-04 NOTE — Progress Notes (Signed)
[facility] [place] NAME: Alexandra Glass    MR#:  [id number]  DATE OF BIRTH:  [birth year]    CHIEF COMPLAINT:   Chief Complaint  Patient presents with  . Fall   Patient continues to be confused.   Not opening her eyes today.  Family at bedside.  REVIEW OF SYSTEMS:    Review of Systems  Unable to perform ROS: Mental acuity    DRUG ALLERGIES:   Allergies  Allergen Reactions  . No Known Allergies     VITALS:  Blood pressure (!) 183/84, pulse 74, temperature 99.3 F (37.4 C), temperature source Oral, resp. rate (!) 28, height 5' 4"  (1.626 m), weight 63.6 kg (140 lb 3.4 oz), SpO2 98 %.  PHYSICAL EXAMINATION:   Physical Exam  GENERAL:  83 y.o.-year-old patient lying in the bed with no acute distress.  Patient has vitiligo. EYES: Pupils equal, round, reactive to light. No scleral icterus. Extraocular muscles intact.  HEENT: Head atraumatic, normocephalic. Oropharynx and nasopharynx clear.  NECK:  Supple, no jugular venous distention. No thyroid enlargement, no tenderness.  LUNGS: Normal breath sounds bilaterally, no wheezing, rales,rhonchi or crepitation. No use of accessory muscles of respiration.  CARDIOVASCULAR: S1, S2 normal. No murmurs, rubs, or gallops.  ABDOMEN: Soft, nontender, nondistended. Bowel sounds present. No organomegaly or mass.  EXTREMITIES: No edema. Psych;unable to follow commands   LABORATORY PANEL:   CBC Recent Labs  Lab 04/01/17 0720  WBC 8.5  HGB 12.7  HCT 38.0  PLT 201   ------------------------------------------------------------------------------------------------------------------  Chemistries  Recent Labs  Lab 03/28/17 2200  04/04/17 0448  NA 140   < > 144  K 3.3*   < > 3.6  CL 109   < > 112*  CO2 23   < > 25  GLUCOSE 148*   < > 123*  BUN 23*   < > 33*  CREATININE 0.82   < > 0.81  CALCIUM 8.8*   < > 9.0  MG  --    < > 2.2  AST 26  --   --   ALT 13*  --   --   ALKPHOS 49   --   --   BILITOT 0.7  --   --    < > = values in this interval not displayed.   ------------------------------------------------------------------------------------------------------------------  Cardiac Enzymes No results for input(s): TROPONINI in the last 168 hours. ------------------------------------------------------------------------------------------------------------------  RADIOLOGY:  No results found.   ASSESSMENT AND PLAN:   Principal Problem:   Intracranial hemorrhage (HCC) Active Problems:   Chronic dementia without behavioral disturbance   Essential hypertension   Cerebral vascular disease   Hypothyroidism   Dysphagia   Aphasia   Palliative care by specialist   Goals of care, counseling/discussion   * Large intracranial hemorrhage in the left frontal area with mild midline shift from left to right.  Weaned off nicardipine drip, continuing p.o. metoprolol HCTZ On Vimpat. Appreciate neurology help Started on dysphagia 1 diet.  Patient has poor appetite.  Pocketing food. Met with family at bedside.  Explained patient's poor prognosis. Patient would be a candidate for inpatient hospice. Family to discuss further with palliative care team.  * hypothyroid rhythm: Continue Synthyroid.  * Radiation fibrosis of lung  * Hypokalemia Replaced    CODE STATUS:DNR  TOTAL TIME TAKING CARE OF THIS PATIENT: 35 minutes.   POSSIBLE D/C IN 1-2 DAYS, DEPENDING ON CLINICAL CONDITION.  Neita Carp M.D on 04/04/2017  at 4:41 PM  Between 7am to 6pm - Pager - [phone]  After 6pm go to [email or web address] - password [email or web address] [facility]  Office  [phone]  CC: Primary care physician; Juline Patch, MD

## 2017-04-04 NOTE — Progress Notes (Signed)
Daily Progress Note   Patient Name: Alexandra Glass       Date: 04/04/2017 DOB: 1924-06-14  Age: 83 y.o. MRN#: [id number] Attending Physician: Hillary Bow, MD Primary Care Physician: Juline Patch, MD Admit Date: 03/28/2017  Reason for Consultation/Follow-up: Establishing goals of care  Subjective: Patient in bed, too lethargic to participate with PT today. Holding food and meds in mouth. SLP discussed comfort feeding and pureed diet with family. Met with patient's son and niece for continued GOC. Discussed illness trajectory and expectations. Continued aggressive medical care vs comfort options were discussed. Home hospice and residential hospice home options offered. Son to discuss with other family members and contact providers with decisions tomorrow. He says he understands that patient is likely facing end of life. He just needs time to discuss with his family.   Review of Systems  Unable to perform ROS: Mental status change    Length of Stay: 6  Current Medications: Scheduled Meds:  . atorvastatin  20 mg Oral q1800  . chlorhexidine  15 mL Mouth Rinse BID  . hydrochlorothiazide  25 mg Oral Daily  . lacosamide  100 mg Oral BID  . latanoprost  1 drop Both Eyes QHS  . letrozole  2.5 mg Oral Daily  . levothyroxine  75 mcg Oral QAC breakfast  . lisinopril  20 mg Oral Daily  . mouth rinse  15 mL Mouth Rinse q12n4p  . metoprolol tartrate  37.5 mg Oral BID    Continuous Infusions: . lactated ringers with kcl 50 mL/hr at 04/03/17 2321    PRN Meds: acetaminophen **OR** [DISCONTINUED] acetaminophen (TYLENOL) oral liquid 160 mg/5 mL **OR** acetaminophen, hydrALAZINE, morphine injection  Physical Exam  Neurological:  Awakens to voice   Nursing note and vitals reviewed.             Vital Signs: BP (!) 158/84 (BP Location: Left Arm)   Pulse 83   Temp 98.2 F (36.8 C) (Oral)   Resp 18   Ht '5\' 4"'$  (1.626 m)   Wt 63.6 kg (140 lb 3.4 oz)   SpO2 100%   BMI 24.07 kg/m  SpO2: SpO2: 100 % O2 Device: O2 Device: Not Delivered O2 Flow Rate:    Intake/output summary:   Intake/Output Summary (Last 24 hours) at 04/04/2017 1230 Last data filed at 04/04/2017 [phone]  Gross per 24 hour  Intake 1302 ml  Output 800 ml  Net 502 ml   LBM: Last BM Date: 04/02/17 Baseline Weight: Weight: 63.6 kg (140 lb 3.4 oz) Most recent weight: Weight: 63.6 kg (140 lb 3.4 oz)       Palliative Assessment/Data: PPS: 20%   Flowsheet Rows     Most Recent Value  Intake Tab  Referral Department  Critical care  Unit at Time of Referral  ICU  Palliative Care Primary Diagnosis  Neurology  Date Notified  03/29/17  Palliative Care Type  New Palliative care  Reason for referral  Clarify Goals of Care  Date of Admission  03/28/17  Date first seen by Palliative Care  03/31/17  # of days Palliative referral response time  2 Day(s)  # of days IP prior to Palliative referral  1  Clinical Assessment  Palliative Performance Scale Score  30%  Psychosocial & Spiritual Assessment  Palliative Care Outcomes  Patient/Family meeting held?  Yes  Who was at the meeting?  patient and son  Palliative Care Outcomes  Clarified goals of care, Provided psychosocial or spiritual support, ACP counseling assistance, Linked to palliative care logitudinal support      Patient Active Problem List   Diagnosis Date Noted  . Dysphagia   . Aphasia   . Palliative care by specialist   . Goals of care, counseling/discussion   . Intracranial hemorrhage ([place]) 03/29/2017  . Microcytic red blood cells 03/08/2017  . Hypothyroidism 03/28/2016  . Osteopenia 03/21/2016  . Hypokalemia 12/09/2015  . 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 History of stroke 11/03/2015  . Essential hypertension 10/06/2015  . Cerebral  vascular disease 10/06/2015  . TIA (transient ischemic attack) 09/27/2015  . Accelerated hypertension 09/27/2015  . Chronic dementia without behavioral disturbance 09/27/2015  . B12 deficiency 03/03/2015  . Amnesia 11/27/2014  . Breast cancer, right breast ([place]) 11/12/2014  . Cancer of upper lobe of right lung ([place]) 11/12/2014  . Bilateral kidney masses 11/12/2014  . Hyperlipidemia 10/09/2014    Palliative Care Assessment & Plan   Patient Profile:83 y.o. female  with past medical history of thyroid disease, renal and breast cancer followed by oncology, CVA, and dementia admitted on 03/28/2017 after found unresponsive by family at home. Head CT revealed left frontal ICH. Neurology and neurosurgery following. No surgical interventions and plan to manage medically. MRI brain 12/13 continues to show left frontal lobe hemorrhage with intraventricular extension and 105m left to right midline shift. MRA reveals severe stenosis of nondominant distal vertebral. Neuro recommending EEG. Followed by SLP and started on dysphagia 1 nectar thick diet. PT/OT following. DNR. Palliative medicine consultation for goals of care.    Assessment/Recommendations/Plan   Patient son to discuss rehab vs hospice with family and contact providers with decision tomorrow   Code Status:  DNR  Prognosis:   < 4 weeks d/t not eating or drinking s/p ICH with midline shift  Discharge Planning:  To Be Determined  Care plan was discussed with patient's son- LFritz Pickereland niece-Erline Levine  Thank you for allowing the Palliative Medicine Team to assist in the care of this patient.   Time In: 1200 Time Out: 1235 Total Time 35 mins Prolonged Time Billed no      Greater than 50%  of this time was spent counseling and coordinating care related to the above assessment and plan.  KMariana Kaufman AGNP-C Palliative Medicine   Please contact Palliative Medicine Team phone at [phone] questions and  concerns.

## 2017-04-04 NOTE — Progress Notes (Signed)
Physical Therapy Treatment Patient Details Name: Alexandra Glass MRN: [id number] DOB: [birth year] Today's Date: 04/04/2017    History of Present Illness Pt is a 83 y/o F s/p fall with R frontal ICH with midline shift with findings consistent with amyloid angiopathy.  She was found on the floor unresponsive.  Carotid dopplers show no evidence of hemodynamically significant stenosis.  Neurosurgery stated that this was not intervenable, and recommended medical management in the ICU. Echocardiogram shows no cardiac source of emboli with an EF of 55%. MRI brain on 12/13 showed slight interval increase in the size of the L F IPH, though the basal cisterns and other pertinent spinal fluid spaces show patency.  Received PT order and verbal confirmation from Dr. Doy Mince that pt may be seen by PT on 12/14 with no restrictions.Ascending aorta atherosclerosis noted.  Pt's PMH includes dementia, R breast cancer s/p mastectomy, renal cancer.    PT Comments    Pt lethargic during session.  Awoke briefly but quickly fell back asleep.  BLE PROM x 10 in attempts to awaken pt but unsuccessful.  Follow Up Recommendations  SNF     Equipment Recommendations       Recommendations for Other Services       Precautions / Restrictions Precautions Precautions: Fall Precaution Comments: Monitor BP; port L chest Restrictions Weight Bearing Restrictions: No    Mobility  Bed Mobility                  Transfers                    Ambulation/Gait                 Stairs            Wheelchair Mobility    Modified Rankin (Stroke Patients Only)       Balance                                            Cognition Arousal/Alertness: Lethargic   Overall Cognitive Status: Difficult to assess                                 General Comments: lethargic, falling asleep during session      Exercises Other Exercises Other Exercises: BLE general  ROM exercises x 10 to attempt to awaken pt and to increase comfort.      General Comments        Pertinent Vitals/Pain Pain Assessment: No/denies pain    Home Living                      Prior Function            PT Goals (current goals can now be found in the care plan section) Progress towards PT goals: Not progressing toward goals - comment    Frequency    7X/week      PT Plan Current plan remains appropriate    Co-evaluation              AM-PAC PT "6 Clicks" Daily Activity  Outcome Measure  Difficulty turning over in bed (including adjusting bedclothes, sheets and blankets)?: Unable Difficulty moving from lying on back to sitting on the side of the bed? : Unable Difficulty sitting down on  and standing up from a chair with arms (e.g., wheelchair, bedside commode, etc,.)?: Unable Help needed moving to and from a bed to chair (including a wheelchair)?: Total Help needed walking in hospital room?: Total Help needed climbing 3-5 steps with a railing? : Total 6 Click Score: 6    End of Session Equipment Utilized During Treatment: Gait belt Activity Tolerance: Patient limited by lethargy;Patient limited by fatigue Patient left: in bed;with call bell/phone within reach;with bed alarm set;with family/visitor present         Time: 6767-2094 PT Time Calculation (min) (ACUTE ONLY): 8 min  Charges:  $Therapeutic Exercise: 8-22 mins                    G Codes:       Chesley Noon, PTA 04/04/17, 11:08 AM

## 2017-04-04 NOTE — Progress Notes (Signed)
Initial Nutrition Assessment  DOCUMENTATION CODES:   Not applicable  INTERVENTION:   Vital Cuisine TID, each supplement provides 520kcal and 22g of protein.   Magic cup TID with meals, each supplement provides 290 kcal and 9 grams of protein  Bowel regimen as needed  NUTRITION DIAGNOSIS:   Inadequate oral intake related to acute illness(ICH, dementia ) as evidenced by meal completion < 50%.  GOAL:   Patient will meet greater than or equal to 90% of their needs  MONITOR:   PO intake, Supplement acceptance, Labs, Weight trends, I & O's  REASON FOR ASSESSMENT:   LOS    ASSESSMENT:   83 y.o. female  with past medical history of thyroid disease, renal and breast cancer followed by oncology, CVA, and dementia admitted on 03/28/2017 after found unresponsive by family at home. Head CT revealed left frontal ICH.    Pt with poor appetite and oral intake; eating 25% of meals. Pt seen by SLP 12/17 and approved for dysphagia 1/ nectar thick diet. Per chart, pt weight stable pta. RD will order nectar thick supplements. Pt being followed by palliative care.   Medications reviewed and include: Synthroid   Labs reviewed: BUN 33(H) cbgs- 134, 126, 123 x 48 hrs  Diet Order:  DIET - DYS 1 Room service appropriate? Yes with Assist; Fluid consistency: Nectar Thick  EDUCATION NEEDS:   Not appropriate for education at this time  Skin:  Reviewed RN Assessment  Last BM:  12/16- type 4  Height:   Ht Readings from Last 1 Encounters:  03/29/17 5\' 4"  (1.626 m)    Weight:   Wt Readings from Last 1 Encounters:  03/29/17 140 lb 3.4 oz (63.6 kg)    Ideal Body Weight:  54.5 kg  BMI:  Body mass index is 24.07 kg/m.  Estimated Nutritional Needs:   Kcal:  1400-1600kcal/day   Protein:  64-70g/day   Fluid:  >1.6L/day   Koleen Distance MS, RD, LDN Pager #[phone] After Hours Pager: [phone]

## 2017-04-05 ENCOUNTER — Encounter: Payer: Self-pay | Admitting: Internal Medicine

## 2017-04-05 ENCOUNTER — Other Ambulatory Visit: Payer: Self-pay

## 2017-04-05 DIAGNOSIS — Z515 Encounter for palliative care: Secondary | ICD-10-CM

## 2017-04-05 DIAGNOSIS — Z7189 Other specified counseling: Secondary | ICD-10-CM

## 2017-04-05 MED ORDER — POLYVINYL ALCOHOL 1.4 % OP SOLN
1.0000 [drp] | Freq: Four times a day (QID) | OPHTHALMIC | Status: DC | PRN
  Filled 2017-04-05: qty 15

## 2017-04-05 MED ORDER — MORPHINE SULFATE (PF) 2 MG/ML IV SOLN: 2.0000 mg | INTRAVENOUS | Status: DC | PRN

## 2017-04-05 MED ORDER — DILTIAZEM HCL 30 MG PO TABS
30.0000 mg | ORAL_TABLET | Freq: Once | ORAL | Status: AC
  Administered 2017-04-05: 30 mg via ORAL
  Filled 2017-04-05: qty 1

## 2017-04-05 MED ORDER — MORPHINE SULFATE (CONCENTRATE) 10 MG/0.5ML PO SOLN: 5.0000 mg | ORAL | Status: DC | PRN

## 2017-04-05 MED ORDER — HALOPERIDOL LACTATE 2 MG/ML PO CONC
0.5000 mg | ORAL | Status: DC | PRN
  Filled 2017-04-05: qty 0.3

## 2017-04-05 MED ORDER — LORAZEPAM 2 MG/ML IJ SOLN: 1.0000 mg | INTRAMUSCULAR | Status: DC | PRN

## 2017-04-05 MED ORDER — HALOPERIDOL LACTATE 5 MG/ML IJ SOLN
0.5000 mg | INTRAMUSCULAR | Status: DC | PRN
Start: 2017-04-05 — End: 2017-04-05
  Filled 2017-04-05: qty 0.1

## 2017-04-05 MED ORDER — GLYCOPYRROLATE 1 MG PO TABS
1.0000 mg | ORAL_TABLET | ORAL | Status: DC | PRN
Start: 2017-04-05 — End: 2017-04-05
  Filled 2017-04-05: qty 1

## 2017-04-05 MED ORDER — LORAZEPAM 2 MG/ML IJ SOLN
1.0000 mg | INTRAMUSCULAR | Status: DC | PRN
Start: 2017-04-05 — End: 2017-04-05

## 2017-04-05 MED ORDER — GLYCOPYRROLATE 0.2 MG/ML IJ SOLN
0.2000 mg | INTRAMUSCULAR | Status: DC | PRN
  Filled 2017-04-05: qty 1

## 2017-04-05 MED ORDER — LORAZEPAM 1 MG PO TABS: 1.0000 mg | ORAL_TABLET | ORAL | 0 refills | Status: AC | PRN

## 2017-04-05 MED ORDER — LORAZEPAM 1 MG PO TABS: 1.0000 mg | ORAL_TABLET | ORAL | Status: DC | PRN

## 2017-04-05 MED ORDER — LACOSAMIDE 100 MG PO TABS: 100.0000 mg | ORAL_TABLET | Freq: Two times a day (BID) | ORAL | 0 refills | Status: AC

## 2017-04-05 MED ORDER — MORPHINE SULFATE (CONCENTRATE) 10 MG/0.5ML PO SOLN: 5.0000 mg | ORAL | Status: AC | PRN

## 2017-04-05 MED ORDER — MORPHINE SULFATE (PF) 4 MG/ML IV SOLN: 4.0000 mg | INTRAVENOUS | Status: DC | PRN

## 2017-04-05 MED ORDER — LORAZEPAM 2 MG/ML PO CONC: 1.0000 mg | ORAL | Status: DC | PRN

## 2017-04-05 MED ORDER — HALOPERIDOL 0.5 MG PO TABS
0.5000 mg | ORAL_TABLET | ORAL | Status: DC | PRN
  Filled 2017-04-05: qty 1

## 2017-04-05 NOTE — Progress Notes (Signed)
PT Cancellation Note  Patient Details Name: Alexandra Glass MRN: [id number] DOB: 1925-01-10   Cancelled Treatment:    Reason Eval/Treat Not Completed: Fatigue/lethargy limiting ability to participate   Maylon Cos, in with pt.  Pt continues to be lethargic and unable to participate in session.  He requested to hold session this am as family was on their way in to visit.  Will continue as appropriate.   Chesley Noon 04/05/2017, 9:13 AM

## 2017-04-05 NOTE — Progress Notes (Signed)
Daily Progress Note   Patient Name: Alexandra Glass       Date: 04/05/2017 DOB: 1924-09-06  Age: 83 y.o. MRN#: [id number] Attending Physician: Hillary Bow, MD Primary Care Physician: Juline Patch, MD Admit Date: 03/28/2017  Reason for Consultation/Follow-up: Establishing goals of care  Subjective: Met with patient's son and daughter in law for followup. Patient continues to only eat bites and sips. Is not following commands. Wakes to voice then falls back asleep. Has intermittent ability to swallow medications or follow commands. Has not been able to participate with PT, OT or SLP.   [name] has decided to proceed with transition to comfort care and requests residential hospice placement.   Review of Systems  Unable to perform ROS: Mental status change    Length of Stay: 7  Current Medications: Scheduled Meds:  . chlorhexidine  15 mL Mouth Rinse BID  . lacosamide  100 mg Oral BID  . latanoprost  1 drop Both Eyes QHS  . mouth rinse  15 mL Mouth Rinse q12n4p    Continuous Infusions:   PRN Meds: acetaminophen **OR** [DISCONTINUED] acetaminophen (TYLENOL) oral liquid 160 mg/5 mL **OR** acetaminophen, morphine injection, morphine injection  Physical Exam  Constitutional: She appears well-developed. No distress.  Cardiovascular: Normal rate and regular rhythm.  Pulmonary/Chest: Effort normal and breath sounds normal.  Neurological:  Lethargic, opens eyes briefly to voice, does not follow commands  Skin:  vitiligo  Nursing note and vitals reviewed.           Vital Signs: BP (!) 95/59 (BP Location: Left Arm)   Pulse 75   Temp 98.7 F (37.1 C)   Resp (!) 22   Ht 5' 4"  (1.626 m)   Wt 63.6 kg (140 lb 3.4 oz)   SpO2 98%   BMI 24.07 kg/m  SpO2: SpO2: 98 % O2 Device: O2  Device: Not Delivered O2 Flow Rate:    Intake/output summary:   Intake/Output Summary (Last 24 hours) at 04/05/2017 1455 Last data filed at 04/05/2017 1829 Gross per 24 hour  Intake 1214.83 ml  Output 550 ml  Net 664.83 ml   LBM: Last BM Date: 04/02/17 Baseline Weight: Weight: 63.6 kg (140 lb 3.4 oz) Most recent weight: Weight: 63.6 kg (140 lb 3.4 oz)       Palliative Assessment/Data: PPS: 10%  Flowsheet Rows     Most Recent Value  Intake Tab  Referral Department  Critical care  Unit at Time of Referral  ICU  Palliative Care Primary Diagnosis  Neurology  Date Notified  03/29/17  Palliative Care Type  New Palliative care  Reason for referral  Clarify Goals of Care  Date of Admission  03/28/17  Date first seen by Palliative Care  03/31/17  # of days Palliative referral response time  2 Day(s)  # of days IP prior to Palliative referral  1  Clinical Assessment  Palliative Performance Scale Score  30%  Psychosocial & Spiritual Assessment  Palliative Care Outcomes  Patient/Family meeting held?  Yes  Who was at the meeting?  patient and son  Palliative Care Outcomes  Clarified goals of care, Provided psychosocial or spiritual support, ACP counseling assistance, Linked to palliative care logitudinal support      Patient Active Problem List   Diagnosis Date Noted  . Dysphagia   . Aphasia   . Palliative care by specialist   . Goals of care, counseling/discussion   . Intracranial hemorrhage ([place]) 03/29/2017  . Microcytic red blood cells 03/08/2017  . Hypothyroidism 03/28/2016  . Osteopenia 03/21/2016  . Hypokalemia 12/09/2015  . 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 History of stroke 11/03/2015  . Essential hypertension 10/06/2015  . Cerebral vascular disease 10/06/2015  . TIA (transient ischemic attack) 09/27/2015  . Accelerated hypertension 09/27/2015  . Chronic dementia without behavioral disturbance 09/27/2015  . B12 deficiency 03/03/2015  . Amnesia  11/27/2014  . Breast cancer, right breast ([place]) 11/12/2014  . Cancer of upper lobe of right lung ([place]) 11/12/2014  . Bilateral kidney masses 11/12/2014  . Hyperlipidemia 10/09/2014    Palliative Care Assessment & Plan   Patient Profile: 83 y.o.femalewith past medical history of thyroid disease, renaland breast cancer followed by oncology,CVA, and dementia admitted on12/11/2018after found unresponsive by family at home. Head CT revealed left frontal ICH. Neurology and neurosurgery following. No surgical interventions and plan to manage medically. MRI brain 12/13 continues to show left frontal lobe hemorrhage with intraventricular extension and 84m left to right midline shift. MRA reveals severe stenosis of nondominant distal vertebral. Neuro recommending EEG. Followed by SLP and started on dysphagia 1 nectar thick diet. PT/OT following. DNR. Palliative medicine consultation for goals of care.  Assessment/Recommendations/Plan   Transition to comfort measures only  Referral for residential hospice   Comfort medications as ordered  Goals of Care and Additional Recommendations:  Limitations on Scope of Treatment: Full Comfort Care  Code Status:  DNR  Prognosis:   < 2 weeks d/t ICH with midline shift, pt not eating or drinking, not following commands  Discharge Planning:  Hospice facility  Care plan was discussed with patient's son- LAlize Borrayo Thank you for allowing the Palliative Medicine Team to assist in the care of this patient.   Time In: 1430 Time Out: 1455 Total Time 25 mins Prolonged Time Billed No      Greater than 50%  of this time was spent counseling and coordinating care related to the above assessment and plan.  KMariana Kaufman AGNP-C Palliative Medicine   Please contact Palliative Medicine Team phone at [phone] questions and concerns.

## 2017-04-05 NOTE — Progress Notes (Signed)
Alexandra Glass to be D/C'd to [facility] per MD order.  Discharge instructions were given to EMS.   Allergies as of 04/05/2017      Reactions   No Known Allergies       Medication List    STOP taking these medications   ARICEPT 10 MG tablet Generic drug:  donepezil   aspirin 325 MG tablet   atorvastatin 20 MG tablet Commonly known as:  LIPITOR   hydrochlorothiazide 12.5 MG tablet Commonly known as:  HYDRODIURIL   latanoprost 0.005 % ophthalmic solution Commonly known as:  XALATAN   letrozole 2.5 MG tablet Commonly known as:  FEMARA   levothyroxine 75 MCG tablet Commonly known as:  SYNTHROID, LEVOTHROID   lidocaine-prilocaine cream Commonly known as:  EMLA   memantine 5 MG tablet Commonly known as:  NAMENDA   potassium chloride 10 MEQ tablet Commonly known as:  K-DUR   vitamin B-12 1000 MCG tablet Commonly known as:  CYANOCOBALAMIN     TAKE these medications   Lacosamide 100 MG Tabs Take 1 tablet (100 mg total) by mouth 2 (two) times daily.   LORazepam 1 MG tablet Commonly known as:  ATIVAN Take 1 tablet (1 mg total) by mouth every 4 (four) hours as needed for anxiety.   morphine CONCENTRATE 10 MG/0.5ML Soln concentrated solution Take 0.25 mLs (5 mg total) by mouth every 2 (two) hours as needed for moderate pain (or dyspnea).       Vitals:   04/05/17 1216 04/05/17 1627  BP: (!) 95/59 116/61  Pulse: 75 78  Resp: (!) 22 19  Temp: 98.7 F (37.1 C) 98.5 F (36.9 C)  SpO2: 98% 98%    Skin clean, dry and intact without evidence of skin break down, no evidence of skin tears noted.Pt discharged with port. Site without signs and symptoms of complications. Pt denies pain at this time. No complaints noted.  An After Visit Summary was printed and given to the EMS. Alexandra Glass

## 2017-04-05 NOTE — Clinical Social Work Note (Signed)
Palliative Care has informed CSW that after meeting with patient's son, he wishes for patient to hospice home. Santiago Glad with [facility] is aware and has completed referral and discharge to hospice home. Shela Leff MSW,lCSW [phone]

## 2017-04-05 NOTE — Progress Notes (Signed)
[facility] at [place] NAME: Alexandra Glass    MR#:  [id number]  DATE OF BIRTH:  10-07-1924    CHIEF COMPLAINT:   Chief Complaint  Patient presents with  . Fall   Patient continues to be confused.    Poor oral intake.  Son and daughter-in-law at bedside.  REVIEW OF SYSTEMS:    Review of Systems  Unable to perform ROS: Mental acuity    DRUG ALLERGIES:   Allergies  Allergen Reactions  . No Known Allergies     VITALS:  Blood pressure (!) 95/59, pulse 75, temperature 98.7 F (37.1 C), resp. rate (!) 22, height _0  (1.626 m), weight 63.6 kg (140 lb 3.4 oz), SpO2 98 %.  PHYSICAL EXAMINATION:   Physical Exam  GENERAL:  83 y.o.-year-old patient lying in the bed with no acute distress.  Patient has vitiligo. EYES: Pupils equal, round, reactive to light. No scleral icterus. Extraocular muscles intact.  HEENT: Head atraumatic, normocephalic. Oropharynx and nasopharynx clear.  NECK:  Supple, no jugular venous distention. No thyroid enlargement, no tenderness.  LUNGS: Normal breath sounds bilaterally, no wheezing, rales,rhonchi or crepitation. No use of accessory muscles of respiration.  CARDIOVASCULAR: S1, S2 normal. No murmurs, rubs, or gallops.  ABDOMEN: Soft, nontender, nondistended. Bowel sounds present. No organomegaly or mass.  EXTREMITIES: No edema. Psych - Awake  LABORATORY PANEL:   CBC Recent Labs  Lab 04/01/17 0720  WBC 8.5  HGB 12.7  HCT 38.0  PLT 201   ------------------------------------------------------------------------------------------------------------------  Chemistries  Recent Labs  Lab 04/04/17 0448  NA 144  K 3.6  CL 112*  CO2 25  GLUCOSE 123*  BUN 33*  CREATININE 0.81  CALCIUM 9.0  MG 2.2   ------------------------------------------------------------------------------------------------------------------  Cardiac Enzymes No results for input(s): TROPONINI in the last 168  hours. ------------------------------------------------------------------------------------------------------------------  RADIOLOGY:  No results found.   ASSESSMENT AND PLAN:   Principal Problem:   Intracranial hemorrhage (HCC) Active Problems:   Chronic dementia without behavioral disturbance   Essential hypertension   Cerebral vascular disease   Hypothyroidism   Dysphagia   Aphasia   Palliative care by specialist   Goals of care, counseling/discussion   Advance care planning   Terminal care   * Large intracranial hemorrhage in the left frontal area with mild midline shift from left to right.  Weaned off nicardipine drip, continuing p.o. metoprolol HCTZ On Vimpat. Appreciate neurology help Started on dysphagia 1 diet.  Patient has poor appetite.  Pocketing food.  Met with son and daughter-in-law at bedside.  Options seem to be home with hospice or hospice home for end-of-life care.  Family has further questions for palliative care.  Patient is not a candidate for rehab as she is not following instructions and cannot participate in therapy  * hypothyroid rhythm: Continue Synthyroid.  * Radiation fibrosis of lung  * Hypokalemia Replaced  CODE STATUS:DNR  TOTAL TIME TAKING CARE OF THIS PATIENT: 35 minutes.   POSSIBLE D/C IN 1-2 DAYS, DEPENDING ON CLINICAL CONDITION.  Leia Alf Aleighya Mcanelly M.D on 04/05/2017 at 3:12 PM  Between 7am to 6pm - Pager - [phone]  After 6pm go to [email or web address] - password [email or web address] [facility]  Office  [phone]  CC: Primary care physician; Juline Patch, MD

## 2017-04-05 NOTE — Care Management (Signed)
Notified by palliative patient is to transition to [facility].  CSW has been placed.  RNCM signing off. Please re consult if indicated.

## 2017-04-05 NOTE — Discharge Summary (Signed)
[facility] at [place] NAME: Grady Lucci    MR#:  [id number]  DATE OF BIRTH:  Jul 23, 1924  DATE OF ADMISSION:  03/28/2017 ADMITTING PHYSICIAN: Lance Coon, MD  DATE OF DISCHARGE: No discharge date for patient encounter.  PRIMARY CARE PHYSICIAN: Juline Patch, MD   ADMISSION DIAGNOSIS:  Intracranial hemorrhage ([place]) [I62.9] Chronic dementia without behavioral disturbance [F03.90] Non-traumatic rhabdomyolysis [M62.82]  DISCHARGE DIAGNOSIS:  Principal Problem:   Intracranial hemorrhage (HCC) Active Problems:   Chronic dementia without behavioral disturbance   Essential hypertension   Cerebral vascular disease   Hypothyroidism   Dysphagia   Aphasia   Palliative care by specialist   Goals of care, counseling/discussion   Advance care planning   Terminal care   SECONDARY DIAGNOSIS:   Past Medical History:  Diagnosis Date  . Breast cancer ([place])    right  . Hypertension   . Personal history of chemotherapy   . Personal history of radiation therapy   . Renal cancer ([place])   . Thyroid disease      ADMITTING HISTORY  HISTORY OF PRESENT ILLNESS:  Alexandra Glass  is a 83 y.o. female who presents with responsive state.  Family at bedside provides the history as the patient is unable.  Family states that her last known well was the night prior to presentation to the ED.  Family member tried to call her the next morning and she did not answer, so different family member went by the house to see her and found her down and unresponsive.  Patient's family found her at her home and states that they feel that she fell sometime in the morning, as it appeared that she had slept in her bed the night before.  Here in the ED she was found to have a right frontal intracerebral hemorrhage.  Neurosurgery stated that this was not intervenable, and recommended medical management in the ICU.  Hospitalist were called for admission  HOSPITAL COURSE:   * Large  intracranial hemorrhage in the left frontal area with mild midline shift from left to right * Acute encephalopathy over dementia * HTN * Hypothyroidism * Hypokalemia * Severe protein calorie malnutiriton * Dysphagia  Patient was admitted to ICU on a nicardipine drip.  Later transition to oral antihypertensive medications and transferred to the floor. Patient was quite agitated initially.  Later she was confused and drowsy.  She was placed on antiseizure prophylaxis with Vimpat.  Did not have any sedative medications.  Her nutritional status was poor with taking only a few bites when family fed her.  Patient did not improved and with very poor prognosis is being transferred to hospice home for end-of-life care.   CONSULTS OBTAINED:  Treatment Team:  Alexis Goodell, MD  DRUG ALLERGIES:   Allergies  Allergen Reactions  . No Known Allergies     DISCHARGE MEDICATIONS:   Allergies as of 04/05/2017      Reactions   No Known Allergies       Medication List    STOP taking these medications   ARICEPT 10 MG tablet Generic drug:  donepezil   aspirin 325 MG tablet   atorvastatin 20 MG tablet Commonly known as:  LIPITOR   hydrochlorothiazide 12.5 MG tablet Commonly known as:  HYDRODIURIL   latanoprost 0.005 % ophthalmic solution Commonly known as:  XALATAN   letrozole 2.5 MG tablet Commonly known as:  FEMARA   levothyroxine 75 MCG tablet Commonly known as:  SYNTHROID, LEVOTHROID  lidocaine-prilocaine cream Commonly known as:  EMLA   memantine 5 MG tablet Commonly known as:  NAMENDA   potassium chloride 10 MEQ tablet Commonly known as:  K-DUR   vitamin B-12 1000 MCG tablet Commonly known as:  CYANOCOBALAMIN     TAKE these medications   Lacosamide 100 MG Tabs Take 1 tablet (100 mg total) by mouth 2 (two) times daily.   LORazepam 1 MG tablet Commonly known as:  ATIVAN Take 1 tablet (1 mg total) by mouth every 4 (four) hours as needed for anxiety.   morphine  CONCENTRATE 10 MG/0.5ML Soln concentrated solution Take 0.25 mLs (5 mg total) by mouth every 2 (two) hours as needed for moderate pain (or dyspnea).       Today   VITAL SIGNS:  Blood pressure (!) 95/59, pulse 75, temperature 98.7 F (37.1 C), resp. rate (!) 22, height 5\' 4"  (1.626 m), weight 63.6 kg (140 lb 3.4 oz), SpO2 98 %.  I/O:    Intake/Output Summary (Last 24 hours) at 04/05/2017 1529 Last data filed at 04/05/2017 1503 Gross per 24 hour  Intake 1334.83 ml  Output 550 ml  Net 784.83 ml    PHYSICAL EXAMINATION:  Physical Exam  GENERAL:  83 y.o.-year-old patient lying in the bed with no acute distress.  LUNGS: Normal breath sounds bilaterally CARDIOVASCULAR: S1, S2  ABDOMEN: Soft, non-tender PSYCHIATRIC: The patient is drowzy  DATA REVIEW:   CBC Recent Labs  Lab 04/01/17 0720  WBC 8.5  HGB 12.7  HCT 38.0  PLT 201    Chemistries  Recent Labs  Lab 04/04/17 0448  NA 144  K 3.6  CL 112*  CO2 25  GLUCOSE 123*  BUN 33*  CREATININE 0.81  CALCIUM 9.0  MG 2.2    Cardiac Enzymes No results for input(s): TROPONINI in the last 168 hours.  Microbiology Results  Results for orders placed or performed during the hospital encounter of 03/28/17  Urine culture     Status: None   Collection Time: 03/28/17  8:18 PM  Result Value Ref Range Status   Specimen Description URINE, CATHETERIZED  Final   Special Requests NONE  Final   Culture   Final    NO GROWTH Performed at [facility], [address], [place], [place] [address]    Report Status 03/30/2017 FINAL  Final  MRSA PCR Screening     Status: None   Collection Time: 03/29/17  2:42 AM  Result Value Ref Range Status   MRSA by PCR NEGATIVE NEGATIVE Final    Comment:        The GeneXpert MRSA Assay (FDA approved for NASAL specimens only), is one component of a comprehensive MRSA colonization surveillance program. It is not intended to diagnose MRSA infection nor to guide or monitor treatment  for MRSA infections.     RADIOLOGY:  No results found.  Follow up with PCP in 1 week.  Management plans discussed with the patient, family and they are in agreement.  CODE STATUS:     Code Status Orders  (From admission, onward)        Start     Ordered   04/05/17 1504  Do not attempt resuscitation (DNR)  Continuous    Question Answer Comment  In the event of cardiac or respiratory ARREST Do not call a "code blue"   In the event of cardiac or respiratory ARREST Do not perform Intubation, CPR, defibrillation or ACLS   In the event of cardiac  or respiratory ARREST Use medication by any route, position, wound care, and other measures to relive pain and suffering. May use oxygen, suction and manual treatment of airway obstruction as needed for comfort.      04/05/17 1505    Code Status History    Date Active Date Inactive Code Status Order ID Comments User Context   03/29/2017 02:45 04/05/2017 15:05 DNR [id number]  Lance Coon, MD Inpatient   09/27/2015 20:15 09/29/2015 20:01 Full Code [id number]  Idelle Crouch, MD Inpatient      TOTAL TIME TAKING CARE OF THIS PATIENT ON DAY OF DISCHARGE: more than 30 minutes.   Leia Alf Lavance Beazer M.D on 04/05/2017 at 3:29 PM  Between 7am to 6pm - Pager - [phone]  After 6pm go to [email or web address] - password [email or web address] [facility]  Office  [phone]  CC: Primary care physician; Juline Patch, MD  Note: This dictation was prepared with Dragon dictation along with smaller phrase technology. Any transcriptional errors that result from this process are unintentional.

## 2017-04-05 NOTE — Progress Notes (Signed)
New hospice home referral received from [facility] following Palliative medicine consult. Patient is a 83 year old woman with past medical history of thyroid disease, renal and breast cancer followed by oncology, CVA, and dementia admitted on 03/28/2017 after found unresponsive by family at home. Head CT revealed left frontal ICH. Repeat MRI on 12/13 continued to show left frontal lobe hemorrhage with intraventricular extension and 24m left to right midline shift. MRA reveled severe stenosis of nondominant distal vertebral. Palliative Medicine has been following and met again with family today. Patient remains lethargic,  aphasic, with poor appetite, requiring pureed diet with nectar thick liquids. Family has chosen to focus on comfort with transfer to the hospice home. Writer met in the room with patient's son LFritz Pickereland his wife to initiate education regarding hospice services, philosophy and team approach to care with good understanding voiced. Questions answered, consents signed. Patient information faxed to referral. Patient was alert at times but did not participate in the conversation. Report called to the hospice home, EMS notified for transport. Hospital care team all updated and aware. KFlo ShanksRN, BSN, [facility] [phone]

## 2017-04-18 DEATH — deceased

## 2017-04-19 ENCOUNTER — Ambulatory Visit: Payer: Medicare Other | Admitting: Family Medicine

## 2017-09-20 ENCOUNTER — Other Ambulatory Visit: Payer: Medicare Other

## 2017-09-20 ENCOUNTER — Ambulatory Visit: Payer: Medicare Other | Admitting: Hematology and Oncology

## 2017-09-20 ENCOUNTER — Ambulatory Visit: Payer: Medicare Other

## 2018-03-11 IMAGING — MR MR HEAD W/O CM
10 series · 48 of 48 positions shown · non-contrast
Comparison: Head CT 09/27/2015 and MRI 07/18/2012

CLINICAL DATA: Intermittent slurred speech and right-sided weakness
for 2 days. History of breast and lung cancer.

EXAM:
MRI HEAD WITHOUT CONTRAST
TECHNIQUE: Multiplanar, multiecho pulse sequences of the brain and surrounding
structures were obtained without intravenous contrast.

[Series 3: T1 · sagittal · 5.0mm · 0.45mm/px · 3 of 27 slices shown (1 of 2)]
[im 1/27]
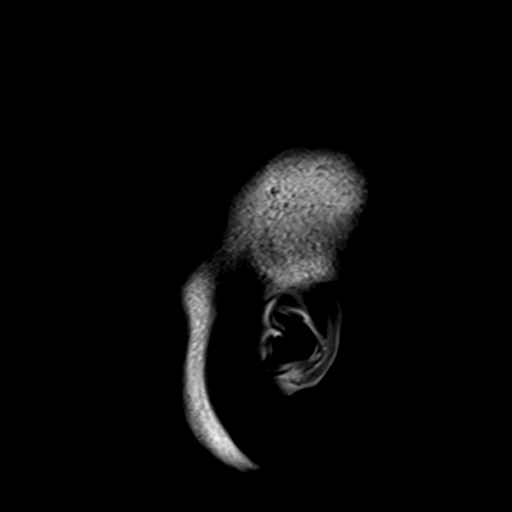
[im 14/27]
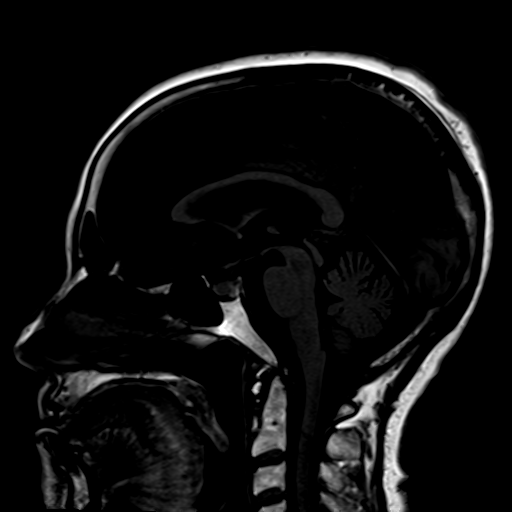
[im 27/27]
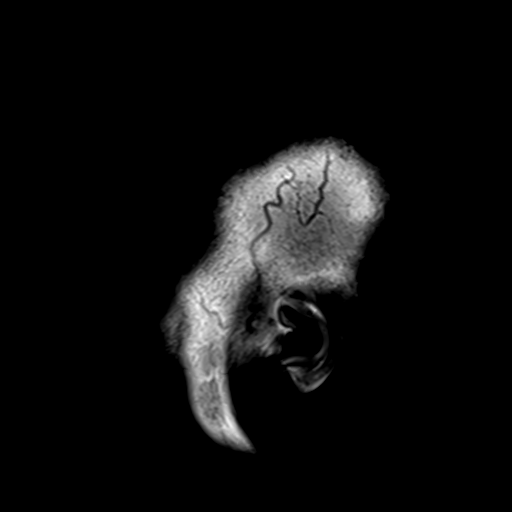

[Series 5: DWI · axial · 3.0mm · 1.80mm/px · z∈[-33,+129]mm · 7 of 55 slices shown (1 of 4)]
[im 1/55]
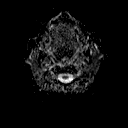
[im 10/55]
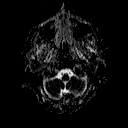
[im 19/55]
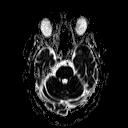
[im 28/55]
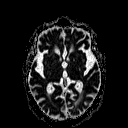
[im 37/55]
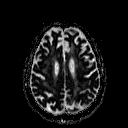
[im 46/55]
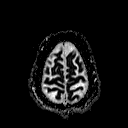
[im 55/55]
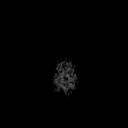

[Series 7: DWI · coronal · 3.0mm · 1.80mm/px · 6 of 45 slices shown (2 of 4)]
[im 1/45]
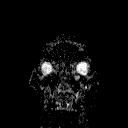
[im 9/45]
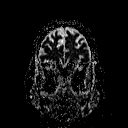
[im 18/45]
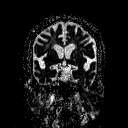
[im 27/45]
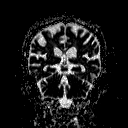
[im 36/45]
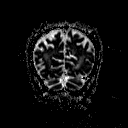
[im 45/45]
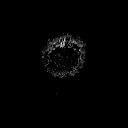

[Series 8: T2 · axial · 5.0mm · 0.60mm/px · z∈[-36,+133]mm · 3 of 27 slices shown (1 of 3)]
[im 1/27]
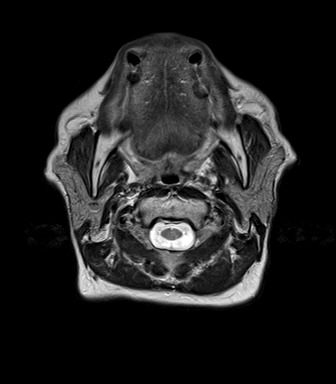
[im 14/27]
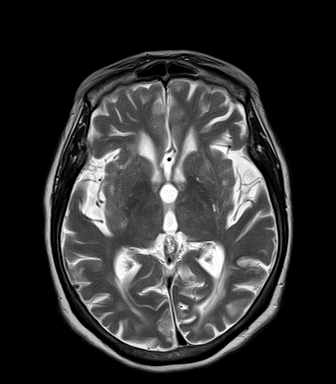
[im 27/27]
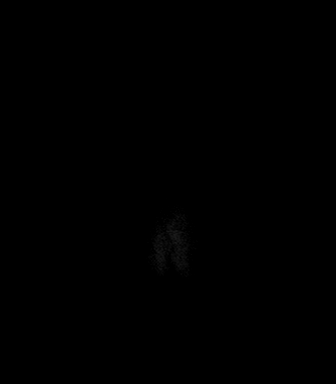

[Series 9: FLAIR · axial · 5.0mm · 0.45mm/px · z∈[-36,+133]mm · 3 of 27 slices shown]
[im 1/27]
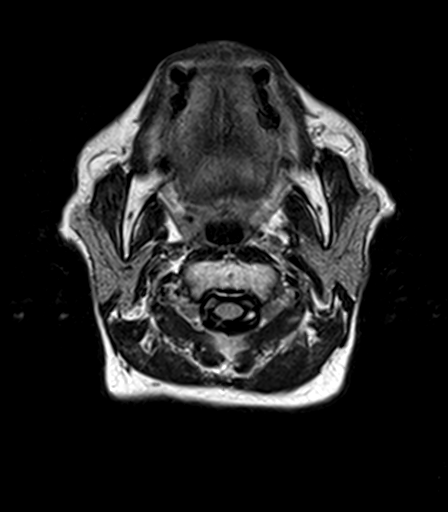
[im 14/27]
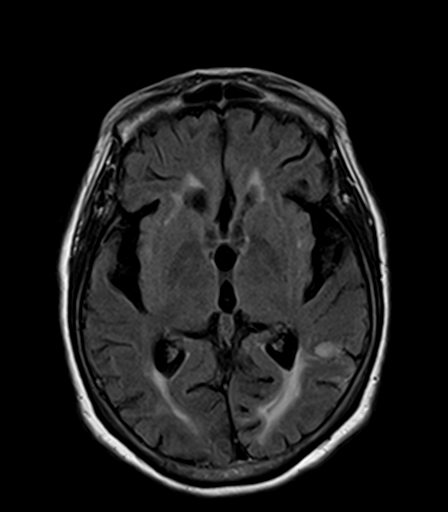
[im 27/27]
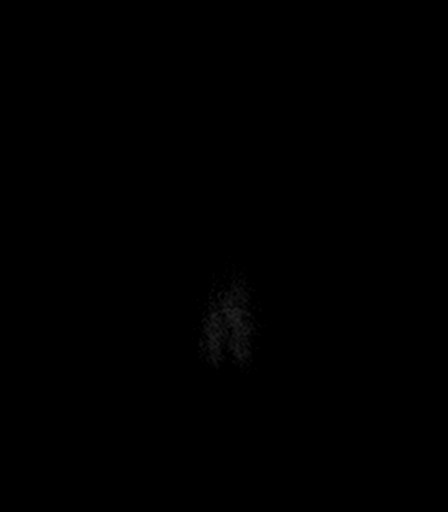

[Series 10: T2 · axial · 5.0mm · 0.45mm/px · z∈[-36,+133]mm · 3 of 27 slices shown (2 of 3)]
[im 1/27]
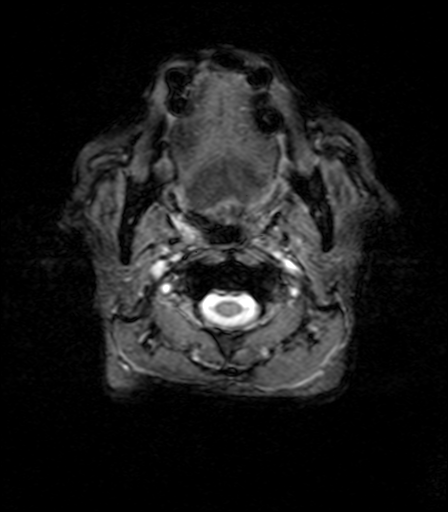
[im 14/27]
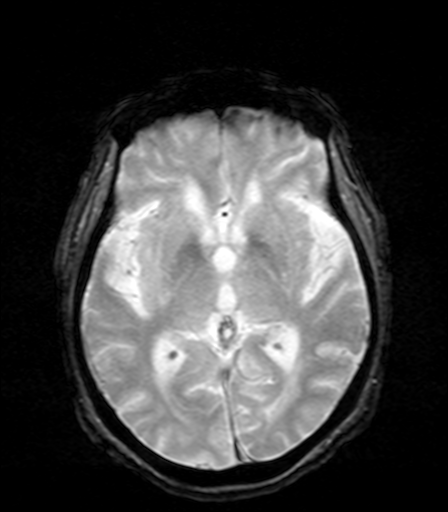
[im 27/27]
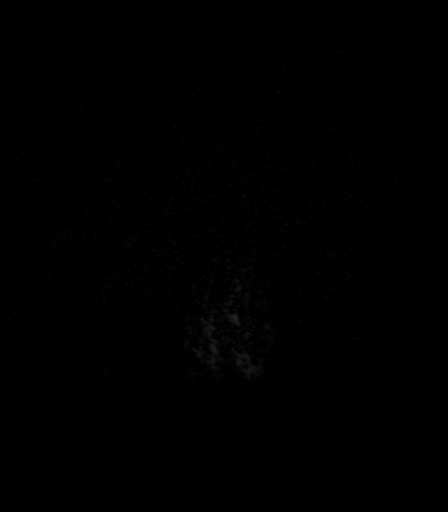

[Series 11: T1 · axial · 3.0mm · 1.00mm/px · z∈[-33,+132]mm · 7 of 56 slices shown (2 of 2)]
[im 1/56]
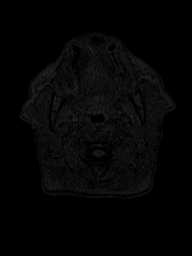
[im 10/56]
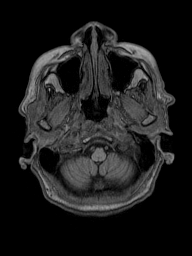
[im 19/56]
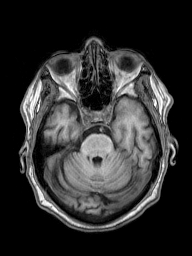
[im 28/56]
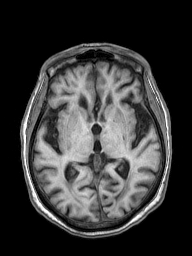
[im 37/56]
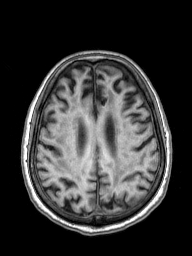
[im 46/56]
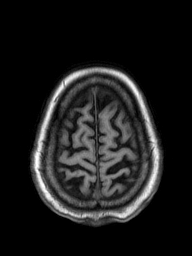
[im 56/56]
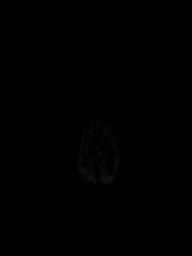

[Series 12: T2 · coronal · 5.0mm · 0.49mm/px · 3 of 27 slices shown (3 of 3)]
[im 1/27]
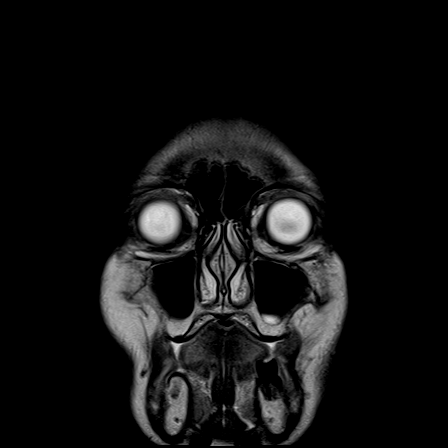
[im 14/27]
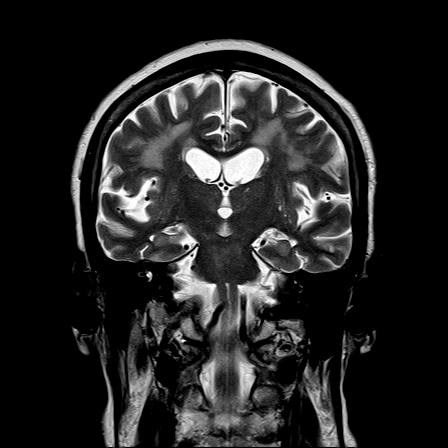
[im 27/27]
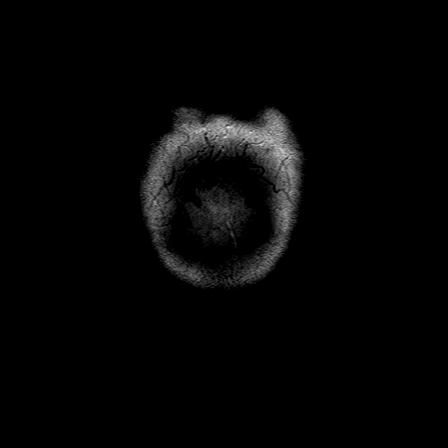

[Series 100: DWI · axial · 3.0mm · 1.80mm/px · z∈[-27,+129]mm · 7 of 53 slices shown (3 of 4)]
[im 1/53]
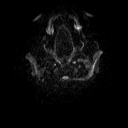
[im 9/53]
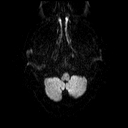
[im 18/53]
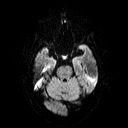
[im 27/53]
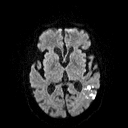
[im 35/53]
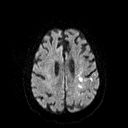
[im 44/53]
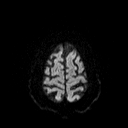
[im 53/53]
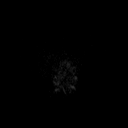

[Series 101: DWI · coronal · 3.0mm · 1.80mm/px · 6 of 45 slices shown (4 of 4)]
[im 1/45]
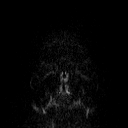
[im 9/45]
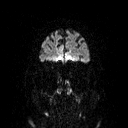
[im 18/45]
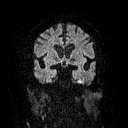
[im 27/45]
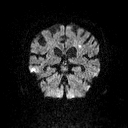
[im 36/45]
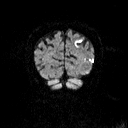
[im 45/45]
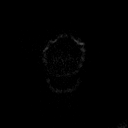

[48 of 48 positions shown; findings below may reference images not displayed]

FINDINGS: There are patchy areas of acute cortical and subcortical infarction
throughout the posterior left MCA territory involving the posterior
frontal lobe, parietal lobe, and posterior temporal lobe, overall
small to moderate in volume. There is no evidence of associated
hemorrhage.

A chronic cortical infarct is again seen involving the left
parieto-occipital region. There is also a chronic hemorrhagic
infarct involving the left cingulate gyrus and adjacent anterior
corpus callosum which is new from the prior MRI. Patchy to confluent
T2 hyperintensities throughout the cerebral white matter bilaterally
elsewhere are similar to the prior MRI and nonspecific but
compatible with extensive chronic small vessel ischemic disease.
Mild chronic small vessel ischemic changes are again noted in the
pons. Tiny, chronic infarcts are again seen in both cerebellar
hemispheres. There is moderate global cerebral atrophy. No mass,
midline shift, or extra-axial fluid collection is identified.

Prior right cataract extraction is noted. Paranasal sinuses and
mastoid air cells are clear. Major intracranial vascular flow voids
are preserved.
IMPRESSION: 1. Patchy acute infarcts throughout the posterior left MCA
territory.
2. Extensive chronic small vessel ischemic disease.
3. Chronic left parieto-occipital infarct.
